# Patient Record
Sex: Male | Born: 1940
Health system: Southern US, Community
[De-identification: ages and names within clinical notes are randomized; demographics above are authoritative.]

## PROBLEM LIST (undated history)

## (undated) DIAGNOSIS — K648 Other hemorrhoids: Principal | ICD-10-CM

## (undated) DIAGNOSIS — Z86010 Personal history of colonic polyps: Secondary | ICD-10-CM

## (undated) DIAGNOSIS — E785 Hyperlipidemia, unspecified: Secondary | ICD-10-CM

## (undated) DIAGNOSIS — I10 Essential (primary) hypertension: Secondary | ICD-10-CM

## (undated) DIAGNOSIS — K5790 Diverticulosis of intestine, part unspecified, without perforation or abscess without bleeding: Secondary | ICD-10-CM

## (undated) DIAGNOSIS — E042 Nontoxic multinodular goiter: Secondary | ICD-10-CM

## (undated) DIAGNOSIS — D126 Benign neoplasm of colon, unspecified: Secondary | ICD-10-CM

## (undated) DIAGNOSIS — I2510 Atherosclerotic heart disease of native coronary artery without angina pectoris: Secondary | ICD-10-CM

## (undated) HISTORY — PX: WRIST SURGERY: SHX841

## (undated) HISTORY — DX: Other hemorrhoids: K64.8

## (undated) HISTORY — DX: Atherosclerotic heart disease of native coronary artery without angina pectoris: I25.10

## (undated) HISTORY — DX: Personal history of colonic polyps: Z86.010

## (undated) HISTORY — DX: Hyperlipidemia, unspecified: E78.5

## (undated) HISTORY — DX: Nontoxic multinodular goiter: E04.2

## (undated) HISTORY — DX: Essential (primary) hypertension: I10

## (undated) HISTORY — DX: Benign neoplasm of colon, unspecified: D12.6

## (undated) HISTORY — DX: Diverticulosis of intestine, part unspecified, without perforation or abscess without bleeding: K57.90

## (undated) HISTORY — PX: COLONOSCOPY: SHX174

---

## 1998-04-28 ENCOUNTER — Encounter: Admission: RE | Admit: 1998-04-28 | Discharge: 1998-04-28 | Payer: Self-pay | Admitting: Sports Medicine

## 1999-07-21 ENCOUNTER — Encounter: Admission: RE | Admit: 1999-07-21 | Discharge: 1999-07-21 | Payer: Self-pay | Admitting: Family Medicine

## 2000-09-16 ENCOUNTER — Emergency Department (HOSPITAL_COMMUNITY): Admission: EM | Admit: 2000-09-16 | Discharge: 2000-09-16 | Payer: Self-pay | Admitting: Emergency Medicine

## 2001-01-25 ENCOUNTER — Encounter: Admission: RE | Admit: 2001-01-25 | Discharge: 2001-01-25 | Payer: Self-pay | Admitting: Sports Medicine

## 2001-09-20 ENCOUNTER — Encounter: Admission: RE | Admit: 2001-09-20 | Discharge: 2001-09-20 | Payer: Self-pay | Admitting: Sports Medicine

## 2002-08-09 ENCOUNTER — Encounter (INDEPENDENT_AMBULATORY_CARE_PROVIDER_SITE_OTHER): Payer: Self-pay | Admitting: Specialist

## 2002-08-09 ENCOUNTER — Ambulatory Visit (HOSPITAL_COMMUNITY): Admission: RE | Admit: 2002-08-09 | Discharge: 2002-08-09 | Payer: Self-pay | Admitting: Gastroenterology

## 2002-08-09 DIAGNOSIS — Z860100 Personal history of colon polyps, unspecified: Secondary | ICD-10-CM

## 2002-08-09 HISTORY — DX: Personal history of colon polyps, unspecified: Z86.0100

## 2002-08-09 HISTORY — DX: Personal history of colonic polyps: Z86.010

## 2003-06-17 ENCOUNTER — Encounter: Admission: RE | Admit: 2003-06-17 | Discharge: 2003-06-17 | Payer: Self-pay | Admitting: Sports Medicine

## 2003-12-31 ENCOUNTER — Ambulatory Visit (HOSPITAL_COMMUNITY): Admission: RE | Admit: 2003-12-31 | Discharge: 2003-12-31 | Payer: Self-pay | Admitting: Internal Medicine

## 2004-01-22 ENCOUNTER — Ambulatory Visit (HOSPITAL_COMMUNITY): Admission: RE | Admit: 2004-01-22 | Discharge: 2004-01-22 | Payer: Self-pay | Admitting: Internal Medicine

## 2004-02-02 ENCOUNTER — Ambulatory Visit (HOSPITAL_COMMUNITY): Admission: RE | Admit: 2004-02-02 | Discharge: 2004-02-02 | Payer: Self-pay | Admitting: Internal Medicine

## 2004-02-02 ENCOUNTER — Encounter (INDEPENDENT_AMBULATORY_CARE_PROVIDER_SITE_OTHER): Payer: Self-pay | Admitting: *Deleted

## 2005-05-03 ENCOUNTER — Ambulatory Visit: Payer: Self-pay | Admitting: Internal Medicine

## 2006-01-20 ENCOUNTER — Ambulatory Visit: Payer: Self-pay | Admitting: Family Medicine

## 2006-07-11 ENCOUNTER — Ambulatory Visit: Payer: Self-pay | Admitting: Internal Medicine

## 2006-09-26 ENCOUNTER — Encounter: Payer: Self-pay | Admitting: Infectious Diseases

## 2006-11-13 ENCOUNTER — Ambulatory Visit: Payer: Self-pay | Admitting: Infectious Diseases

## 2006-11-13 DIAGNOSIS — E041 Nontoxic single thyroid nodule: Secondary | ICD-10-CM | POA: Insufficient documentation

## 2006-11-13 DIAGNOSIS — K589 Irritable bowel syndrome without diarrhea: Secondary | ICD-10-CM | POA: Insufficient documentation

## 2006-11-13 DIAGNOSIS — I10 Essential (primary) hypertension: Secondary | ICD-10-CM | POA: Insufficient documentation

## 2007-08-08 ENCOUNTER — Ambulatory Visit: Payer: Self-pay | Admitting: Internal Medicine

## 2007-08-11 ENCOUNTER — Ambulatory Visit: Payer: Self-pay | Admitting: Family Medicine

## 2007-08-23 ENCOUNTER — Telehealth: Payer: Self-pay | Admitting: Internal Medicine

## 2007-09-11 ENCOUNTER — Ambulatory Visit: Payer: Self-pay | Admitting: Family Medicine

## 2007-09-18 ENCOUNTER — Encounter: Payer: Self-pay | Admitting: Family Medicine

## 2007-09-18 ENCOUNTER — Encounter: Payer: Self-pay | Admitting: Internal Medicine

## 2007-12-05 ENCOUNTER — Ambulatory Visit: Payer: Self-pay | Admitting: Sports Medicine

## 2007-12-05 DIAGNOSIS — M779 Enthesopathy, unspecified: Secondary | ICD-10-CM | POA: Insufficient documentation

## 2008-01-24 ENCOUNTER — Telehealth: Payer: Self-pay | Admitting: Internal Medicine

## 2008-02-19 ENCOUNTER — Telehealth: Payer: Self-pay | Admitting: Internal Medicine

## 2008-03-04 ENCOUNTER — Encounter: Payer: Self-pay | Admitting: Internal Medicine

## 2008-09-05 ENCOUNTER — Encounter: Payer: Self-pay | Admitting: Internal Medicine

## 2008-10-07 ENCOUNTER — Telehealth (INDEPENDENT_AMBULATORY_CARE_PROVIDER_SITE_OTHER): Payer: Self-pay | Admitting: *Deleted

## 2008-10-07 ENCOUNTER — Telehealth: Payer: Self-pay | Admitting: Internal Medicine

## 2008-12-02 ENCOUNTER — Encounter: Payer: Self-pay | Admitting: Cardiology

## 2008-12-17 ENCOUNTER — Emergency Department (HOSPITAL_COMMUNITY): Admission: EM | Admit: 2008-12-17 | Discharge: 2008-12-18 | Payer: Self-pay | Admitting: Emergency Medicine

## 2008-12-25 ENCOUNTER — Ambulatory Visit: Payer: Self-pay | Admitting: Internal Medicine

## 2008-12-25 LAB — CONVERTED CEMR LAB
ALT: 18 units/L (ref 0–53)
AST: 25 units/L (ref 0–37)
Albumin: 3.9 g/dL (ref 3.5–5.2)
Alkaline Phosphatase: 56 units/L (ref 39–117)
BUN: 18 mg/dL (ref 6–23)
Basophils Relative: 0.2 % (ref 0.0–3.0)
Bilirubin Urine: NEGATIVE
Cholesterol: 175 mg/dL (ref 0–200)
Eosinophils Relative: 2 % (ref 0.0–5.0)
Glucose, Bld: 138 mg/dL — ABNORMAL HIGH (ref 70–99)
Hemoglobin, Urine: NEGATIVE
LDL Cholesterol: 111 mg/dL — ABNORMAL HIGH (ref 0–99)
Leukocytes, UA: NEGATIVE
Lymphocytes Relative: 34.5 % (ref 12.0–46.0)
Lymphs Abs: 1.6 10*3/uL (ref 0.7–4.0)
MCHC: 35.9 g/dL (ref 30.0–36.0)
MCV: 85.6 fL (ref 78.0–100.0)
Monocytes Relative: 10 % (ref 3.0–12.0)
Nitrite: NEGATIVE
PSA: 1.53 ng/mL (ref 0.10–4.00)
Platelets: 179 10*3/uL (ref 150.0–400.0)
Potassium: 3.5 meq/L (ref 3.5–5.1)
RBC: 4.77 M/uL (ref 4.22–5.81)
Sodium: 142 meq/L (ref 135–145)
TSH: 2.4 microintl units/mL (ref 0.35–5.50)
Total Protein: 6.6 g/dL (ref 6.0–8.3)
Triglycerides: 119 mg/dL (ref 0.0–149.0)
Urine Glucose: NEGATIVE mg/dL
Urobilinogen, UA: 0.2 (ref 0.0–1.0)

## 2009-01-01 ENCOUNTER — Ambulatory Visit: Payer: Self-pay | Admitting: Internal Medicine

## 2009-01-01 DIAGNOSIS — R0789 Other chest pain: Secondary | ICD-10-CM | POA: Insufficient documentation

## 2009-01-19 ENCOUNTER — Telehealth: Payer: Self-pay | Admitting: Internal Medicine

## 2009-01-19 ENCOUNTER — Ambulatory Visit (HOSPITAL_COMMUNITY): Admission: RE | Admit: 2009-01-19 | Discharge: 2009-01-19 | Payer: Self-pay | Admitting: Internal Medicine

## 2009-01-20 ENCOUNTER — Telehealth (INDEPENDENT_AMBULATORY_CARE_PROVIDER_SITE_OTHER): Payer: Self-pay

## 2009-01-20 ENCOUNTER — Encounter: Payer: Self-pay | Admitting: Internal Medicine

## 2009-01-21 ENCOUNTER — Ambulatory Visit: Payer: Self-pay

## 2009-01-21 ENCOUNTER — Ambulatory Visit: Payer: Self-pay | Admitting: Cardiology

## 2009-01-21 ENCOUNTER — Encounter: Payer: Self-pay | Admitting: Cardiology

## 2009-01-21 ENCOUNTER — Encounter (INDEPENDENT_AMBULATORY_CARE_PROVIDER_SITE_OTHER): Payer: Self-pay | Admitting: *Deleted

## 2009-01-22 ENCOUNTER — Ambulatory Visit: Payer: Self-pay | Admitting: Cardiology

## 2009-01-22 ENCOUNTER — Ambulatory Visit (HOSPITAL_COMMUNITY): Admission: RE | Admit: 2009-01-22 | Discharge: 2009-01-23 | Payer: Self-pay | Admitting: Cardiology

## 2009-01-22 ENCOUNTER — Encounter: Payer: Self-pay | Admitting: Cardiology

## 2009-01-22 HISTORY — PX: CORONARY STENT PLACEMENT: SHX1402

## 2009-01-22 LAB — CONVERTED CEMR LAB
BUN: 14 mg/dL (ref 6–23)
Basophils Absolute: 0 10*3/uL (ref 0.0–0.1)
Basophils Relative: 0.3 % (ref 0.0–3.0)
Calcium: 9.2 mg/dL (ref 8.4–10.5)
Creatinine, Ser: 1 mg/dL (ref 0.4–1.5)
Glucose, Bld: 116 mg/dL — ABNORMAL HIGH (ref 70–99)
Hemoglobin: 14.7 g/dL (ref 13.0–17.0)
Lymphocytes Relative: 25.5 % (ref 12.0–46.0)
Lymphs Abs: 1.3 10*3/uL (ref 0.7–4.0)
Neutrophils Relative %: 59.2 % (ref 43.0–77.0)
Platelets: 154 10*3/uL (ref 150.0–400.0)
Potassium: 3.7 meq/L (ref 3.5–5.1)
RDW: 12.2 % (ref 11.5–14.6)
aPTT: 28.7 s (ref 21.7–28.8)

## 2009-01-29 ENCOUNTER — Encounter: Payer: Self-pay | Admitting: Cardiology

## 2009-01-29 ENCOUNTER — Encounter (HOSPITAL_COMMUNITY): Admission: RE | Admit: 2009-01-29 | Discharge: 2009-04-29 | Payer: Self-pay | Admitting: Cardiology

## 2009-01-31 DIAGNOSIS — E118 Type 2 diabetes mellitus with unspecified complications: Secondary | ICD-10-CM | POA: Insufficient documentation

## 2009-01-31 DIAGNOSIS — E785 Hyperlipidemia, unspecified: Secondary | ICD-10-CM | POA: Insufficient documentation

## 2009-02-01 DIAGNOSIS — I2510 Atherosclerotic heart disease of native coronary artery without angina pectoris: Secondary | ICD-10-CM | POA: Insufficient documentation

## 2009-02-02 ENCOUNTER — Ambulatory Visit: Payer: Self-pay | Admitting: Cardiology

## 2009-02-04 ENCOUNTER — Telehealth: Payer: Self-pay | Admitting: Internal Medicine

## 2009-02-06 ENCOUNTER — Ambulatory Visit: Payer: Self-pay | Admitting: Internal Medicine

## 2009-02-25 ENCOUNTER — Encounter: Payer: Self-pay | Admitting: Cardiology

## 2009-03-03 ENCOUNTER — Ambulatory Visit: Payer: Self-pay | Admitting: Cardiology

## 2009-03-03 LAB — CONVERTED CEMR LAB
Alkaline Phosphatase: 41 units/L (ref 39–117)
Bilirubin, Direct: 0 mg/dL (ref 0.0–0.3)
Cholesterol: 91 mg/dL (ref 0–200)
HDL: 39.5 mg/dL (ref >39.00)
Total Bilirubin: 1 mg/dL (ref 0.3–1.2)
Triglycerides: 66 mg/dL (ref 0.0–149.0)
VLDL: 13.2 mg/dL (ref 0.0–40.0)

## 2009-03-05 ENCOUNTER — Ambulatory Visit: Payer: Self-pay | Admitting: Cardiology

## 2009-03-26 ENCOUNTER — Telehealth: Payer: Self-pay | Admitting: Cardiology

## 2009-03-30 ENCOUNTER — Encounter: Payer: Self-pay | Admitting: Cardiology

## 2009-04-06 ENCOUNTER — Encounter: Payer: Self-pay | Admitting: Internal Medicine

## 2009-04-14 ENCOUNTER — Telehealth: Payer: Self-pay | Admitting: Internal Medicine

## 2009-04-15 ENCOUNTER — Telehealth: Payer: Self-pay | Admitting: Cardiology

## 2009-04-30 ENCOUNTER — Encounter (HOSPITAL_COMMUNITY): Admission: RE | Admit: 2009-04-30 | Discharge: 2009-07-01 | Payer: Self-pay | Admitting: Cardiology

## 2009-05-06 ENCOUNTER — Telehealth: Payer: Self-pay | Admitting: Internal Medicine

## 2009-05-21 ENCOUNTER — Encounter: Payer: Self-pay | Admitting: Cardiology

## 2009-05-26 ENCOUNTER — Telehealth: Payer: Self-pay | Admitting: Cardiology

## 2009-06-08 ENCOUNTER — Encounter: Payer: Self-pay | Admitting: Internal Medicine

## 2009-06-08 ENCOUNTER — Ambulatory Visit: Payer: Self-pay | Admitting: Cardiology

## 2009-06-09 ENCOUNTER — Telehealth: Payer: Self-pay | Admitting: Cardiology

## 2009-06-09 ENCOUNTER — Telehealth (INDEPENDENT_AMBULATORY_CARE_PROVIDER_SITE_OTHER): Payer: Self-pay | Admitting: *Deleted

## 2009-06-10 LAB — CONVERTED CEMR LAB
Bilirubin, Direct: 0 mg/dL (ref 0.0–0.3)
HDL: 44.7 mg/dL (ref >39.00)
Hgb A1c MFr Bld: 6.7 % — ABNORMAL HIGH (ref 4.6–6.5)
LDL Cholesterol: 48 mg/dL (ref 0–99)
Total Bilirubin: 0.9 mg/dL (ref 0.3–1.2)
Total Protein: 6.3 g/dL (ref 6.0–8.3)
Triglycerides: 84 mg/dL (ref 0.0–149.0)
VLDL: 16.8 mg/dL (ref 0.0–40.0)

## 2009-07-07 ENCOUNTER — Ambulatory Visit: Payer: Self-pay | Admitting: Internal Medicine

## 2009-07-08 ENCOUNTER — Telehealth: Payer: Self-pay | Admitting: Internal Medicine

## 2009-08-14 ENCOUNTER — Encounter: Payer: Self-pay | Admitting: Cardiology

## 2009-09-01 ENCOUNTER — Encounter: Payer: Self-pay | Admitting: Internal Medicine

## 2009-09-07 ENCOUNTER — Telehealth: Payer: Self-pay | Admitting: Internal Medicine

## 2009-09-13 ENCOUNTER — Emergency Department (HOSPITAL_COMMUNITY): Admission: EM | Admit: 2009-09-13 | Discharge: 2009-09-13 | Payer: Self-pay | Admitting: Emergency Medicine

## 2009-10-12 ENCOUNTER — Telehealth: Payer: Self-pay | Admitting: Internal Medicine

## 2009-11-26 ENCOUNTER — Ambulatory Visit: Payer: Self-pay | Admitting: Internal Medicine

## 2009-11-26 LAB — CONVERTED CEMR LAB
ALT: 25 units/L (ref 0–53)
AST: 23 units/L (ref 0–37)
Basophils Absolute: 0 10*3/uL (ref 0.0–0.1)
Bilirubin, Direct: 0.1 mg/dL (ref 0.0–0.3)
Chloride: 109 meq/L (ref 96–112)
Eosinophils Absolute: 0.1 10*3/uL (ref 0.0–0.7)
GFR calc non Af Amer: 110.44 mL/min (ref >60)
Glucose, Bld: 125 mg/dL — ABNORMAL HIGH (ref 70–99)
HCT: 40 % (ref 39.0–52.0)
HDL: 46.9 mg/dL (ref >39.00)
Hemoglobin, Urine: NEGATIVE
Hgb A1c MFr Bld: 6.7 % — ABNORMAL HIGH (ref 4.6–6.5)
Ketones, ur: NEGATIVE mg/dL
LDL Cholesterol: 49 mg/dL (ref 0–99)
Lymphocytes Relative: 30.1 % (ref 12.0–46.0)
MCV: 86.3 fL (ref 78.0–100.0)
Platelets: 149 10*3/uL — ABNORMAL LOW (ref 150.0–400.0)
RDW: 13.1 % (ref 11.5–14.6)
Testosterone: 357.82 ng/dL (ref 350.00–890.00)
Total Bilirubin: 0.7 mg/dL (ref 0.3–1.2)
Total CHOL/HDL Ratio: 2
Triglycerides: 78 mg/dL (ref 0.0–149.0)
Urine Glucose: NEGATIVE mg/dL
Urobilinogen, UA: 0.2 (ref 0.0–1.0)
pH: 5.5 (ref 5.0–8.0)

## 2009-11-27 ENCOUNTER — Ambulatory Visit: Payer: Self-pay | Admitting: Internal Medicine

## 2009-11-27 DIAGNOSIS — M159 Polyosteoarthritis, unspecified: Secondary | ICD-10-CM | POA: Insufficient documentation

## 2009-11-30 DIAGNOSIS — N529 Male erectile dysfunction, unspecified: Secondary | ICD-10-CM | POA: Insufficient documentation

## 2009-12-10 ENCOUNTER — Ambulatory Visit: Payer: Self-pay | Admitting: Cardiology

## 2009-12-23 ENCOUNTER — Encounter: Payer: Self-pay | Admitting: Internal Medicine

## 2010-01-28 ENCOUNTER — Encounter: Payer: Self-pay | Admitting: Internal Medicine

## 2010-04-01 ENCOUNTER — Telehealth: Payer: Self-pay | Admitting: Internal Medicine

## 2010-06-01 ENCOUNTER — Encounter: Payer: Self-pay | Admitting: Internal Medicine

## 2010-06-16 ENCOUNTER — Telehealth (INDEPENDENT_AMBULATORY_CARE_PROVIDER_SITE_OTHER): Payer: Self-pay | Admitting: *Deleted

## 2010-06-17 ENCOUNTER — Ambulatory Visit: Payer: Self-pay | Admitting: Internal Medicine

## 2010-06-17 LAB — CONVERTED CEMR LAB: Hgb A1c MFr Bld: 7 % — ABNORMAL HIGH (ref 4.6–6.5)

## 2010-06-18 ENCOUNTER — Ambulatory Visit: Payer: Self-pay | Admitting: Internal Medicine

## 2010-06-22 ENCOUNTER — Encounter: Payer: Self-pay | Admitting: Cardiology

## 2010-06-22 ENCOUNTER — Telehealth: Payer: Self-pay | Admitting: Internal Medicine

## 2010-06-22 ENCOUNTER — Ambulatory Visit: Payer: Self-pay | Admitting: Cardiology

## 2010-08-01 LAB — CONVERTED CEMR LAB: HDL goal, serum: 40 mg/dL

## 2010-08-03 NOTE — Progress Notes (Signed)
Summary: authorization for release of med info to [facility]   Phone Note Call from Patient Call back at Work Phone [phone]   Caller: Patient Summary of Call: Patient has been advised that a representative of [facility] will call office for further information regarding his care.  Patient is giving verbal authorization for information to be released.  Patient does not need a call back. Initial call taken by: Burnard Leigh,  April 15, 2009 2:12 PM  Follow-up for Phone Call        I have not received a call. I will file the message in the pt's chart. Sherri Rad, RN, BSN  April 21, 2009 10:52 AM

## 2010-08-03 NOTE — Assessment & Plan Note (Signed)
Summary: PER PT FU B4 6/9--STC   Vital Signs:  Patient profile:   70 year old male Height:      72 inches Weight:      177 pounds BMI:     24.09 O2 Sat:      97 % on Room air Temp:     97.0 degrees F oral Pulse rate:   63 / minute BP sitting:   120 / 80  (left arm) Cuff size:   regular  Vitals Entered By: Bill Salinas CMA (Nov 27, 2009 11:03 AM)  O2 Flow:  Room air  Primary Care Provider:  Norins   History of Present Illness: Reviewed labs: focused on lipid panel and A1C.  Has AM stiffness and some back pain. Worse in the AM. No limitations in activity and he is not taking any analgesics or anti-inflammatory medications.  He has concerns about sexual performance. He has no diffiuclty attaining an erection or completing intercourse. He does [place] Viagra which has a positive effect. He reports that his libido is intact.   He has not had any cardiac symptoms: he continues to run and maintain a full schedule of work and physical activity.   Current Medications (verified): 1)  Viagra 100 Mg Tabs (Sildenafil Citrate) .... Take 1 Tablet By Mouth Once A Day As Needed 2)  Aspirin Ec 325 Mg Tbec (Aspirin) .... Take One Tablet By Mouth Daily 3)  Plavix 75 Mg Tabs (Clopidogrel Bisulfate) .Marland Kitchen.. 1 Tab Once Daily 4)  Crestor 20 Mg Tabs (Rosuvastatin Calcium) .... Take One Tablet By Mouth Daily. 5)  Zolpidem Tartrate 10 Mg Tabs (Zolpidem Tartrate) .Marland Kitchen.. 1 By Mouth At Bedtime As Needed 6)  Nitroglycerin 0.4 Mg Subl (Nitroglycerin) .... One Tablet Under Tongue Every 5 Minutes As Needed For Chest Pain---May Repeat Times Three As Needed 7)  Metmucil .... As Needed Maybe Once Every 2 Weeks 8)  Tylenol 325 Mg Tabs (Acetaminophen) .... As Needed For Lower Back Pain  Allergies (verified): No Known Drug Allergies  Past History:  Past Medical History: Last updated: 01/31/2009 Hypertension thyroid nodules, sees Dr. Hillary Bow at [facility] Hyperlipidemia CAD S/P DES LAD and Dx1  01/2009  Past Surgical History: Last updated: 09/11/2007 colonoscopy 2008 per Dr. Melchor Amour, several polyps  Family History: Last updated: 28-Jan-2009 father - deceased @89 :Positive for CVA @ 74, bladder cancer mother - deceased @ 45: CHF, CAD negative for colon cancer, CAD.   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  Never Smoked  Risk Factors: Smoking Status: never (09/11/2007)  Review of Systems  The patient denies anorexia, fever, weight loss, weight gain, hoarseness, chest pain, syncope, dyspnea on exertion, peripheral edema, headaches, abdominal pain, severe indigestion/heartburn, muscle weakness, depression, abnormal bleeding, and enlarged lymph nodes.    Physical Exam  General:  alert, well-developed, well-nourished, well-hydrated, and healthy-appearing.   Head:  normocephalic and atraumatic.   Eyes:  corneas and lenses clear and no injection.   Neck:  supple and full ROM.   Lungs:  normal respiratory effort and normal breath sounds.   Heart:  normal rate, regular rhythm, and no murmur.   Msk:  normal ROM, no joint tenderness, no joint swelling, no joint warmth, and no joint deformities.   Pulses:  2+ radial Neurologic:  alert & oriented X3,  cranial nerves II-XII intact, and gait normal.   Skin:  turgor normal and color normal.   Psych:  Oriented X3, memory intact for recent and remote, normally interactive, and good eye contact.     Impression & Recommendations:  Problem # 1:  DIABETES-TYPE 2 (ICD-250.00) Most recent A1C remains @ 6.7%. He is below goal but still in the abnormal range. We discussed comfort with risk and the goals of diabetic therapy.  Plan - minimize risk by initiating medicl treatment: metformin 500mg  two times a day.            repeat A1C in  3 months.   His updated medication list for this problem includes:    Aspirin Ec 325 Mg Tbec (Aspirin) .Marland Kitchen... Take one tablet by mouth daily    Metformin Hcl 500 Mg Tabs (Metformin hcl) .Marland Kitchen... 1 by mouth two times a day  Labs Reviewed: Creat: 1.0 (01/21/2009)    Reviewed HgBA1c results: 6.7 (06/08/2009)  6.9 (01/01/2009)  Problem # 2:  HYPERLIPIDEMIA-MIXED (ICD-272.4) Recent lab reveals excellent control with LDL 49, HDL 46.9. LFTs normal  Plan - continue crestor 2mg  once daily   His updated medication list for this problem includes:    Crestor 20 Mg Tabs (Rosuvastatin calcium) .Marland Kitchen... Take one tablet by mouth daily.  Problem # 3:  CAD, NATIVE VESSEL (ICD-414.01) Very stable with no symptoms and no limitation in activity. He is current with his cardiologist.   His updated medication list for this problem includes:    Aspirin Ec 325 Mg Tbec (Aspirin) .Marland Kitchen... Take one tablet by mouth daily    Plavix 75 Mg Tabs (Clopidogrel bisulfate) .Marland Kitchen... 1 tab once daily    Nitroglycerin 0.4 Mg Subl (Nitroglycerin) ..... One tablet under tongue every 5 minutes as needed for chest pain---may repeat times three as needed  Problem # 4:  HYPERTENSION (ICD-401.9)  BP today: 120/80 Prior BP: 138/82 (07/07/2009)  Prior 10 Yr Risk Heart Disease: N/A (03/05/2009)  Good control - at goal.  Plan - continue present regimen  Problem # 5:  THYROID NODULE (ICD-241.0) Relatively recent follow-up at [facility]. He had a repeat needle biopsy. Everythng is apparently stable.   Problem # 6:  ERECTILE DYSFUNCTION, ORGANIC (ICD-607.84) Mild ED. Testosterone level is normal range. He is advised that there is no indication for testosterone replacement.   His updated medication list for this problem includes:    Viagra 100 Mg Tabs (Sildenafil citrate) .Marland Kitchen... Take 1 tablet by mouth once a day as needed  Problem # 7:  OSTEOARTHRITIS, GENERALIZED (ICD-715.00) New c/o mild AM stiffness. No report of inflammed  joints or persistent chronic pain. Reviewed his symptoms.   Plan - heat           flex-stretch excercise           otc NSAIDs as needed.  His updated medication list for this problem includes:    Aspirin Ec 325 Mg Tbec (Aspirin) .Marland Kitchen... Take one tablet by mouth daily    Tylenol 325 Mg Tabs (Acetaminophen) .Marland Kitchen... As needed for lower back pain  Complete Medication List: 1)  Viagra 100 Mg Tabs (Sildenafil citrate) .... Take 1 tablet by mouth once a day as needed 2)  Aspirin Ec 325 Mg Tbec (Aspirin) .... Take one tablet by mouth daily 3)  Plavix 75 Mg Tabs (Clopidogrel bisulfate) .Marland Kitchen.. 1 tab once daily 4)  Crestor 20 Mg Tabs (Rosuvastatin calcium) .... Take one tablet by mouth daily. 5)  Zolpidem Tartrate 10 Mg  Tabs (Zolpidem tartrate) .Marland Kitchen.. 1 by mouth at bedtime as needed 6)  Nitroglycerin 0.4 Mg Subl (Nitroglycerin) .... One tablet under tongue every 5 minutes as needed for chest pain---may repeat times three as needed 7)  Metmucil  .... As needed maybe once every 2 weeks 8)  Tylenol 325 Mg Tabs (Acetaminophen) .... As needed for lower back pain 9)  Metformin Hcl 500 Mg Tabs (Metformin hcl) .Marland Kitchen.. 1 by mouth two times a day  Patient: Alex Meyers Note: All result statuses are Final unless otherwise noted.  Tests: (1) Lipid Panel (LIPID)   Cholesterol               111 mg/dL                   0-454     ATP III Classification            Desirable:  < 200 mg/dL                    Borderline High:  200 - 239 mg/dL               High:  > = 240 mg/dL   Triglycerides             78.0 mg/dL                  0.9-811.9     Normal:  <150 mg/dL     Borderline High:  147 - 199 mg/dL   HDL                       82.95 mg/dL                 >62.13   VLDL Cholesterol          15.6 mg/dL                  0.8-65.7   LDL Cholesterol           49 mg/dL                    8-46  CHO/HDL Ratio:  CHD Risk                             2                    Men          Women     1/2 Average Risk     3.4           3.3     Average Risk          5.0          4.4     2X Average Risk          9.6          7.1     3X Average Risk          15.0          11.0                           Tests: (2) BMP (METABOL)   Sodium                    141 mEq/L  135-145   Potassium                 3.9 mEq/L                   3.5-5.1   Chloride                  109 mEq/L                   96-112   Carbon Dioxide            25 mEq/L                    19-32   Glucose              [H]  125 mg/dL                   16-10   BUN                       23 mg/dL                    9-60   Creatinine                0.8 mg/dL                   4.5-4.0   Calcium                   9.0 mg/dL                   9.8-11.9   GFR                       110.44 mL/min               >60  Tests: (3) CBC Platelet w/Diff (CBCD)   White Cell Count          4.8 K/uL                    4.5-10.5   Red Cell Count            4.64 Mil/uL                 4.22-5.81   Hemoglobin                14.0 g/dL                   14.7-82.9   Hematocrit                40.0 %                      39.0-52.0   MCV                       86.3 fl                     78.0-100.0   MCHC                      34.9 g/dL                   56.2-13.0   RDW  13.1 %                      11.5-14.6   Platelet Count       [L]  149.0 K/uL                  150.0-400.0   Neutrophil %              58.1 %                      43.0-77.0   Lymphocyte %              30.1 %                      12.0-46.0   Monocyte %                8.7 %                       3.0-12.0   Eosinophils%              2.5 %                       0.0-5.0   Basophils %               0.6 %                       0.0-3.0   Neutrophill Absolute      2.8 K/uL                    1.4-7.7   Lymphocyte Absolute       1.4 K/uL                    0.7-4.0   Monocyte Absolute         0.4 K/uL                    0.1-1.0  Eosinophils, Absolute                             0.1 K/uL                     0.0-0.7   Basophils Absolute        0.0 K/uL                    0.0-0.1  Tests: (4) Hepatic/Liver Function Panel (HEPATIC)   Total Bilirubin           0.7 mg/dL                   0.2-7.2   Direct Bilirubin          0.1 mg/dL                   5.3-6.6   Alkaline Phosphatase      45 U/L                      39-117   AST                       23 U/L  0-37   ALT                       25 U/L                      0-53   Total Protein             6.1 g/dL                    1.6-1.0   Albumin                   3.9 g/dL                    9.6-0.4  Tests: (5) TSH (TSH)   FastTSH                   1.66 uIU/mL                 0.35-5.50  Tests: (6) UDip Only (UDIP)   Color                     YELLOW       RANGE:  Yellow;Lt. Yellow   Clarity                   CLEAR                       Clear   Specific Gravity          >=1.030                     1.000 - 1.030   Urine Ph                  5.5                         5.0-8.0   Protein                   NEGATIVE                    Negative   Urine Glucose             NEGATIVE                    Negative   Ketones                   NEGATIVE                    Negative   Urine Bilirubin           NEGATIVE                    Negative   Blood                     NEGATIVE                    Negative   Urobilinogen              0.2                         0.0 - 1.0   Leukocyte Esterace  NEGATIVE                    Negative   Nitrite                   NEGATIVE                    Negative  Tests: (7) Prostate Specific Antigen (PSA)   PSA-Hyb                   0.94 ng/mL                  0.10-4.00  Tests: (8) Hemoglobin A1C (A1C)   Hemoglobin A1C       [H]  6.7 %                       4.6-6.5     Glycemic Control Guidelines for People with Diabetes:     Non Diabetic:  <6%     Goal of Therapy: <7%     Additional Action Suggested:  >8%   Tests: (9) Testosterone, Total (TESTO)   Testosterone              357.82 ng/dL                 [id number]: METFORMIN HCL 500 MG TABS (METFORMIN HCL) 1 by mouth two times a day  #60 x 12   Entered and Authorized by:   Jacques Navy MD   Signed by:   Jacques Navy MD on 11/27/2009   Method used:   Electronically to        [facility]. [phone]* (retail)       [phone]       [place], [place]  [address]       Ph: [phone]       Fax: [phone]   RxID:   [id number]

## 2010-08-03 NOTE — Letter (Signed)
Summary: [facility]   Imported By: Sherian Rein 12/30/2009 11:53:21  _____________________________________________________________________  External Attachment:    Type:   Image     Comment:   External Document

## 2010-08-03 NOTE — Progress Notes (Signed)
  Phone Note Refill Request Message from:  Fax from Pharmacy on April 01, 2010 11:25 AM  Refills Requested: Medication #1:  ZOLPIDEM TARTRATE 10 MG TABS 1 by mouth at bedtime as needed   Last Refilled: 03/07/2010 Please Advise refill  Initial call taken by: Ami Bullins CMA,  April 01, 2010 11:25 AM  Follow-up for Phone Call        ok to refill x 5 Follow-up by: Jacques Navy MD,  April 01, 2010 12:53 PM    Prescriptions: ZOLPIDEM TARTRATE 10 MG TABS (ZOLPIDEM TARTRATE) 1 by mouth at bedtime as needed  #30 x 5   Entered by:   Ami Bullins CMA   Authorized by:   Jacques Navy MD   Signed by:   Bill Salinas CMA on 04/01/2010   Method used:   Telephoned to ...       [facility]. [phone]* (retail)       [phone]       [place], [place]  [address]       Ph: [phone]       Fax: [phone]   RxID:   [id number]

## 2010-08-03 NOTE — Progress Notes (Signed)
  Phone Note Refill Request Message from:  Fax from Pharmacy on July 08, 2009 8:20 AM  Refills Requested: Medication #1:  ZOLPIDEM TARTRATE 10 MG TABS 1 by mouth at bedtime as needed fax from [facility] [address]. Please Advise refill  Initial call taken by: Ami Bullins CMA,  July 08, 2009 8:20 AM  Follow-up for Phone Call        refill as needed  Follow-up by: Jacques Navy MD,  July 08, 2009 10:08 AM    Prescriptions: ZOLPIDEM TARTRATE 10 MG TABS (ZOLPIDEM TARTRATE) 1 by mouth at bedtime as needed  #30 x 1   Entered by:   Ami Bullins CMA   Authorized by:   Jacques Navy MD   Signed by:   Bill Salinas CMA on 07/08/2009   Method used:   Telephoned to ...       [facility]. [phone]* (retail)       [phone]       [place], [place]  [address]       Ph: [phone]       Fax: [phone]   RxID:   [id number]

## 2010-08-03 NOTE — Assessment & Plan Note (Signed)
Summary: PT NEED DIABETES CHECKED-PAIN IN LEFT THIGH INTERIOR-LB   Vital Signs:  Patient profile:   70 year old male Height:      72 inches Weight:      178 pounds BMI:     24.23 O2 Sat:      97 % on Room air Temp:     98.0 degrees F oral Pulse rate:   58 / minute BP sitting:   138 / 82  (left arm) Cuff size:   regular  Vitals Entered By: Bill Salinas CMA (July 07, 2009 4:49 PM)  O2 Flow:  Room air CC: pt c/o left ear being stopped up. he also complains of pain in the crease of his left leg near his testicle/ ab   Primary Care Provider:  Delonta Yohannes  CC:  pt c/o left ear being stopped up. he also complains of pain in the crease of his left leg near his testicle/ ab.  History of Present Illness: [name] presents with a history of pain in the left groin for several weeks. He is able to run and do all his normal activities. He has had no injury. He has felt no mass/bulge or other abnormality. He recalls no injury or precipitating event. He evidently had a course of doxycycline which did not help  Diabetes has been well controlled with last A1C 6.7%. Discussed goals of therapy and that he is doing a great job.   CAD- he has been pain free and continues on his medical regimen.   Current Medications (verified): 1)  Viagra 100 Mg Tabs (Sildenafil Citrate) .... Take 1 Tablet By Mouth Once A Day As Needed 2)  Aspirin Ec 325 Mg Tbec (Aspirin) .... Take One Tablet By Mouth Daily 3)  Plavix 75 Mg Tabs (Clopidogrel Bisulfate) .Marland Kitchen.. 1 Tab Once Daily 4)  Crestor 20 Mg Tabs (Rosuvastatin Calcium) .... Take One Tablet By Mouth Daily. 5)  Zolpidem Tartrate 10 Mg Tabs (Zolpidem Tartrate) .Marland Kitchen.. 1 By Mouth At Bedtime As Needed 6)  Nitroglycerin 0.4 Mg Subl (Nitroglycerin) .... One Tablet Under Tongue Every 5 Minutes As Needed For Chest Pain---May Repeat Times Three As Needed 7)  Metmucil .... As Needed Maybe Once Every 2 Weeks  Allergies (verified): No Known Drug Allergies  Past  History:  Past Medical History: Last updated: 01/31/2009 Hypertension thyroid nodules, sees Dr. Hillary Bow at [facility] Diabetes Hyperlipidemia CAD S/P DES LAD and Dx1 01/2009  Past Surgical History: Last updated: 09/11/2007 colonoscopy 2008 per Dr. Melchor Amour, several polyps  Family History: Last updated: 01/19/09 father - deceased @89 :Positive for CVA @ 38, bladder cancer mother - deceased @ 52: CHF, CAD negative for colon cancer, CAD.   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  Never Smoked  Risk Factors: Smoking Status: never (09/11/2007)  Review of Systems  The patient denies anorexia, fever, weight loss, decreased hearing, chest pain, dyspnea on exertion, abdominal pain, hematuria, muscle weakness, difficulty walking, and enlarged lymph nodes.    Physical Exam  General:  Slender and fit appearing white male looking younger than his stated age.  Eyes:  corneas and lenses clear and no injection.   Lungs:  normal respiratory effort.   Heart:  normal rate and regular rhythm.   Abdomen:  soft, non-tender, no masses, no guarding, and  no inguinal hernia left.   Msk:  Hips with normal range of motion, no tenderness with internal/external rotation or pressure against the greater trochanter. Pulses:  2+ radial   Impression & Recommendations:  Problem # 1:  INGUINAL PAIN, LEFT (ICD-789.09) No evidence of hip pathology. No evidence of hernia. Suspect groin pull/strain.  Plan - supportive care with stretches, use of NSAIDs. His updated medication list for this problem includes:     Aspirin Ec 325 Mg Tbec (Aspirin) .Marland Kitchen... Take one tablet by mouth daily  Problem # 2:  DIABETES-TYPE 2 (ICD-250.00)  His updated medication list for this problem includes:    Aspirin  Ec 325 Mg Tbec (Aspirin) .Marland Kitchen... Take one tablet by mouth daily  Labs Reviewed: Creat: 1.0 (01/21/2009)    Reviewed HgBA1c results: 6.7 (06/08/2009)  6.9 (01/01/2009)  Good control with diet therapy  Problem # 3:  HYPERLIPIDEMIA-MIXED (ICD-272.4)  His updated medication list for this problem includes:    Crestor 20 Mg Tabs (Rosuvastatin calcium) .Marland Kitchen... Take one tablet by mouth daily.  Labs Reviewed: SGOT: 24 (06/08/2009)   SGPT: 27 (06/08/2009)  Lipid Goals: Chol Goal: 200 (01/01/2009)   HDL Goal: 40 (01/01/2009)   LDL Goal: 100 (01/01/2009)   TG Goal: 150 (01/01/2009)  Prior 10 Yr Risk Heart Disease: N/A (03/05/2009)   HDL:44.70 (06/08/2009), 39.50 (03/03/2009)  LDL:48 (06/08/2009), 38 (03/03/2009)  Chol:109 (06/08/2009), 91 (03/03/2009)  Trig:84.0 (06/08/2009), 66.0 (03/03/2009)  Great control!!  Complete Medication List: 1)  Viagra 100 Mg Tabs (Sildenafil citrate) .... Take 1 tablet by mouth once a day as needed 2)  Aspirin Ec 325 Mg Tbec (Aspirin) .... Take one tablet by mouth daily 3)  Plavix 75 Mg Tabs (Clopidogrel bisulfate) .Marland Kitchen.. 1 tab once daily 4)  Crestor 20 Mg Tabs (Rosuvastatin calcium) .... Take one tablet by mouth daily. 5)  Zolpidem Tartrate 10 Mg Tabs (Zolpidem tartrate) .Marland Kitchen.. 1 by mouth at bedtime as needed 6)  Nitroglycerin 0.4 Mg Subl (Nitroglycerin) .... One tablet under tongue every 5 minutes as needed for chest pain---may repeat times three as needed 7)  Metmucil  .... As needed maybe once every 2 weeks

## 2010-08-03 NOTE — Assessment & Plan Note (Signed)
Summary: f71m   Primary Provider:  Norins   History of Present Illness: Alex Meyers is 70 years old and return for followup management of CAD. On January 23, 1999 and he was admitted with increasing angina and underwent stenting of the LAD and the diagonal branch with drug-eluting stents. He's done quite well since that time and is back to jogging a regular basis without chest pain or other cardiac symptoms.  His other problems include hyperlipidemia and diabetes. And lipid profile just recently which was excellent which showed a total cholesterol of 111 and HDL of 47 and LDL of 49. His hemoglobin A1c was 6.7 and he was started on metformin.  He has a a scrotal infection and is on antibiotics and possibly might need surgery. We talked about not coming off the Plavix before July 22 and also the possibility of staying on Plavix to surgery even after that time.  Current Medications (verified): 1)  Viagra 100 Mg Tabs (Sildenafil Citrate) .... Take 1 Tablet By Mouth Once A Day As Needed 2)  Aspirin Ec 325 Mg Tbec (Aspirin) .... Take One Tablet By Mouth Daily 3)  Plavix 75 Mg Tabs (Clopidogrel Bisulfate) .Marland Kitchen.. 1 Tab Once Daily 4)  Crestor 20 Mg Tabs (Rosuvastatin Calcium) .... Take One Tablet By Mouth Daily. 5)  Zolpidem Tartrate 10 Mg Tabs (Zolpidem Tartrate) .Marland Kitchen.. 1 By Mouth At Bedtime As Needed 6)  Nitroglycerin 0.4 Mg Subl (Nitroglycerin) .... One Tablet Under Tongue Every 5 Minutes As Needed For Chest Pain---May Repeat Times Three As Needed 7)  Metmucil .... As Needed Maybe Once Every 2 Weeks 8)  Tylenol 325 Mg Tabs (Acetaminophen) .... As Needed For Lower Back Pain 9)  Metformin Hcl 500 Mg Tabs (Metformin Hcl) .Marland Kitchen.. 1 By Mouth Two Times A Day  Allergies (verified): No Known Drug Allergies  Past History:  Past Medical History: Reviewed history from 01/31/2009 and no changes required. Hypertension thyroid nodules, sees Dr. Hillary [facility] at [facility] Diabetes Hyperlipidemia CAD S/P DES LAD  and Dx1 01/2009  Review of Systems       ROS is negative except as outlined in HPI.   Vital Signs:  Patient profile:   70 year old male Height:      72 inches Weight:      175 pounds BMI:     23.82 Pulse rate:   66 / minute Pulse rhythm:   regular BP sitting:   126 / 84  (right arm) Cuff size:   large  Vitals Entered By: Danielle Rankin, CMA (December 10, 2009 5:04 PM)  Physical Exam  Additional Exam:  Gen. Well-nourished, in no distress   Neck: No JVD, thyroid not enlarged, no carotid bruits Lungs: No tachypnea, clear without rales, rhonchi or wheezes Cardiovascular: Rhythm regular, PMI not displaced,  heart sounds  normal, no murmurs or gallops, no peripheral edema, pulses normal in all 4 extremities. Abdomen: BS normal, abdomen soft and non-tender without masses or organomegaly, no hepatosplenomegaly. MS: No deformities, no cyanosis or clubbing   Neuro:  No focal sns   Skin:  no lesions    Impression & Recommendations:  Problem # 1:  CAD, NATIVE VESSEL (ICD-414.01) He had stenting of the LAD and diagonal branch with drug-eluting stents in July of 2010. He said no chest pain his palm appears stable. His updated medication list for this problem includes:    Aspirin Ec 325 Mg Tbec (Aspirin) .Marland Kitchen... Take one tablet by mouth daily    Plavix 75 Mg Tabs (  Clopidogrel bisulfate) .Marland Kitchen... 1 tab once daily    Nitroglycerin 0.4 Mg Subl (Nitroglycerin) ..... One tablet under tongue every 5 minutes as needed for chest pain---may repeat times three as needed  Orders: EKG w/ Interpretation (93000)  Problem # 2:  HYPERLIPIDEMIA-MIXED (ICD-272.4) He has hyperlipidemia but has an excellent lipid profile on Crestor. We will continue current therapy. His updated medication list for this problem includes:    Crestor 20 Mg Tabs (Rosuvastatin calcium) .Marland Kitchen... Take one tablet by mouth daily.  Problem # 3:  DIABETES-TYPE 2 (ICD-250.00) This problem is managed by Dr. Rosanne Gutting. His hemoglobin A1c was 6.7 and he  was recently started on metformin. His updated medication list for this problem includes:    Aspirin Ec 325 Mg Tbec (Aspirin) .Marland Kitchen... Take one tablet by mouth daily    Metformin Hcl 500 Mg Tabs (Metformin hcl) .Marland Kitchen... 1 by mouth two times a day  Patient Instructions: 1)  Your physician recommends that you continue on your current medications as directed. Please refer to the Current Medication list given to you today. 2)  Your physician wants you to follow-up in: 6 months.  You will receive a reminder letter in the mail two months in advance. If you don't receive a letter, please call our office to schedule the follow-up appointment.

## 2010-08-03 NOTE — Letter (Signed)
Summary: [facility]  [facility]   Imported By: Marylou Mccoy 07/22/2009 13:09:51  _____________________________________________________________________  External Attachment:    Type:   Image     Comment:   External Document

## 2010-08-03 NOTE — Progress Notes (Signed)
  Phone Note Refill Request Message from:  Fax from Pharmacy on October 12, 2009 8:52 AM  Refills Requested: Medication #1:  ZOLPIDEM TARTRATE 10 MG TABS 1 by mouth at bedtime as needed   Last Refilled: 09/07/2009 Please Advise pt was only given enough for one month in march.   Initial call taken by: Ami Bullins CMA,  October 12, 2009 8:52 AM  Follow-up for Phone Call        ok for #30 with 5 refills Follow-up by: Jacques Navy MD,  October 13, 2009 5:24 AM    Prescriptions: ZOLPIDEM TARTRATE 10 MG TABS (ZOLPIDEM TARTRATE) 1 by mouth at bedtime as needed  #30 x 5   Entered by:   Ami Bullins CMA   Authorized by:   Jacques Navy MD   Signed by:   Bill Salinas CMA on 10/13/2009   Method used:   Telephoned to ...       [facility]. [phone]* (retail)       [phone] [address].       [place], [place]  [address]       Ph: [phone]       Fax: [phone]   RxID:   [id number]

## 2010-08-03 NOTE — Letter (Signed)
Summary: [facility]  [facility]   Imported By: Sherian Rein 09/21/2009 08:21:44  _____________________________________________________________________  External Attachment:    Type:   Image     Comment:   External Document

## 2010-08-03 NOTE — Progress Notes (Signed)
  Phone Note Refill Request Message from:  Fax from Pharmacy on September 07, 2009 10:01 AM  Refills Requested: Medication #1:  ZOLPIDEM TARTRATE 10 MG TABS 1 by mouth at bedtime as needed   Dosage confirmed as above?Dosage Confirmed   Brand Name Necessary? No   Supply Requested: 3 months can pt have refill on medication? Please Advise  Initial call taken by: Ami Bullins CMA,  September 07, 2009 10:02 AM  Follow-up for Phone Call        yes Follow-up by: Etta Grandchild MD,  September 07, 2009 10:39 AM    Prescriptions: ZOLPIDEM TARTRATE 10 MG TABS (ZOLPIDEM TARTRATE) 1 by mouth at bedtime as needed  #30 x 0   Entered and Authorized by:   Ami Bullins CMA   Signed by:   Ami Bullins CMA on 09/07/2009   Method used:   Telephoned to ...       [facility]. [phone]* (retail)       [phone] [address]       [place], [place]  [address]       Ph: [phone]       Fax: [phone]   RxID:   [id number]

## 2010-08-03 NOTE — Letter (Signed)
Summary: Return Visit / [facility]  Return Visit / [facility]   Imported By: Lennie Odor 01/05/2010 11:30:39  _____________________________________________________________________  External Attachment:    Type:   Image     Comment:   External Document

## 2010-08-03 NOTE — Letter (Signed)
Summary: [facility]  [facility]   Imported By: Marylou Mccoy 09/18/2009 17:00:54  _____________________________________________________________________  External Attachment:    Type:   Image     Comment:   External Document

## 2010-08-03 NOTE — Letter (Signed)
Summary: [facility]   Imported By: Lester [place] 06/04/2010 09:42:30  _____________________________________________________________________  External Attachment:    Type:   Image     Comment:   External Document

## 2010-08-04 DIAGNOSIS — Z860100 Personal history of colon polyps, unspecified: Secondary | ICD-10-CM

## 2010-08-04 HISTORY — DX: Personal history of colonic polyps: Z86.010

## 2010-08-04 HISTORY — DX: Personal history of colon polyps, unspecified: Z86.0100

## 2010-08-05 ENCOUNTER — Encounter: Payer: Self-pay | Admitting: Internal Medicine

## 2010-08-05 NOTE — Progress Notes (Signed)
  Phone Note Call from Patient   Caller: Patient Call For: Jacques Navy MD Summary of Call: patient left voice mail that he was intolerant of amaryl - made him feel sick. Plan - can call in actos 15mg  1 by mouth once daily # 30. refill as needed.  Please call patient and let him know.  [phone]; [phone] Thanks Initial call taken by: Jacques Navy MD,  June 22, 2010 1:43 PM  Follow-up for Phone Call        pt informed. rx sent to [facility]. Follow-up by: Brenton Grills CMA Duncan Dull),  June 22, 2010 3:07 PM    New/Updated Medications: ACTOS 15 MG TABS (PIOGLITAZONE HCL) take 1 tablet by mouth once daily Prescriptions: ACTOS 15 MG TABS (PIOGLITAZONE HCL) take 1 tablet by mouth once daily  #30 x 5   Entered by:   Brenton Grills CMA (AAMA)   Authorized by:   Jacques Navy MD   Signed by:   Brenton Grills CMA (AAMA) on 06/22/2010   Method used:   Electronically to        [facility]. [phone]* (retail)       [phone] [address].       [place], [place]  [address]       Ph: [phone]       Fax: [phone]   RxID:   [id number]

## 2010-08-05 NOTE — Assessment & Plan Note (Signed)
Summary: f45m   Primary Provider:  Norins   History of Present Illness: Mr. Delfin is 70 years old and return for followup management of CAD. On January 23, 1999 and he was admitted with increasing angina and underwent stenting of the LAD and the diagonal branch with drug-eluting stents. He's done quite well since that time and is back to jogging a regular basis without chest pain or other cardiac symptoms.  His other problems include hyperlipidemia and diabetes. And lipid profile just recently which was excellent which showed a total cholesterol of 111 and HDL of 47 and LDL of 49. His hemoglobin A1c was 7.0 recently. Dr. Debby Bud had tried him on 2 diabetes medications which he could not tolerate and he is getting ready to try a third which is Actos.  Current Medications (verified): 1)  Viagra 100 Mg Tabs (Sildenafil Citrate) .... Take 1 Tablet By Mouth Once A Day As Needed 2)  Aspirin Ec 325 Mg Tbec (Aspirin) .... Take One Tablet By Mouth Daily 3)  Plavix 75 Mg Tabs (Clopidogrel Bisulfate) .Marland Kitchen.. 1 Tab Once Daily 4)  Crestor 20 Mg Tabs (Rosuvastatin Calcium) .... Take One Tablet By Mouth Daily. 5)  Zolpidem Tartrate 10 Mg Tabs (Zolpidem Tartrate) .Marland Kitchen.. 1 By Mouth At Bedtime As Needed 6)  Nitroglycerin 0.4 Mg Subl (Nitroglycerin) .... One Tablet Under Tongue Every 5 Minutes As Needed For Chest Pain---May Repeat Times Three As Needed 7)  Metmucil .... As Needed Maybe Once Every 2 Weeks 8)  Tylenol 325 Mg Tabs (Acetaminophen) .... As Needed For Lower Back Pain 9)  Actos 15 Mg Tabs (Pioglitazone Hcl) .... Take One Tab By Mouth Once Daily  Allergies (verified): No Known Drug Allergies  Past History:  Past Medical History: Reviewed history from 01/31/2009 and no changes required. Hypertension thyroid nodules, sees Dr. Hillary Bow at [facility] Diabetes Hyperlipidemia CAD S/P DES LAD and Dx1 01/2009  Review of Systems       ROS is negative except as outlined in HPI.   Vital  Signs:  Patient profile:   70 year old male Height:      72 inches Weight:      176 pounds BMI:     23.96 Pulse rate:   63 / minute BP sitting:   112 / 70  (left arm)  Vitals Entered By: Laurance Flatten CMA (June 22, 2010 4:24 PM)  Physical Exam  Additional Exam:  Gen. Well-nourished, in no distress   Neck: No JVD, thyroid not enlarged, no carotid bruits Lungs: No tachypnea, clear without rales, rhonchi or wheezes Cardiovascular: Rhythm regular, PMI not displaced,  heart sounds  normal, no murmurs or gallops, no peripheral edema, pulses normal in all 4 extremities. Abdomen: BS normal, abdomen soft and non-tender without masses or organomegaly, no hepatosplenomegaly. MS: No deformities, no cyanosis or clubbing   Neuro:  No focal sns   Skin:  no lesions    Impression & Recommendations:  Problem # 1:  CAD, NATIVE VESSEL (ICD-414.01) In July of 2010 he had drug-eluting stents to the LAD and diagonal branch the LAD after presenting with exertional angina. He's done quite well since that time has had no recent chest pain this problem appears stable. We talked about the issue of continuing Plavix and we are both most comfortable with him continuing on this. He's had no significant problems from this. We will have him follow up with Dr. Excell Seltzer in 6 months after which he can probably be seen yearly. His updated medication list  for this problem includes:    Aspirin Ec 325 Mg Tbec (Aspirin) .Marland Kitchen... Take one tablet by mouth daily    Plavix 75 Mg Tabs (Clopidogrel bisulfate) .Marland Kitchen... 1 tab once daily    Nitroglycerin 0.4 Mg Subl (Nitroglycerin) ..... One tablet under tongue every 5 minutes as needed for chest pain---may repeat times three as needed  Orders: EKG w/ Interpretation (93000)  Problem # 2:  HYPERLIPIDEMIA-MIXED (ICD-272.4) His last lipid profile was excellent on his current medications. His updated medication list for this problem includes:    Crestor 20 Mg Tabs (Rosuvastatin calcium)  .Marland Kitchen... Take one tablet by mouth daily.  Problem # 3:  DIABETES-TYPE 2 (ICD-250.00) This is being managed by his primary care physician. The following medications were removed from the medication list:    Actos 15 Mg Tabs (Pioglitazone hcl) .Marland Kitchen... Take 1 tablet by mouth once daily His updated medication list for this problem includes:    Aspirin Ec 325 Mg Tbec (Aspirin) .Marland Kitchen... Take one tablet by mouth daily    Actos 15 Mg Tabs (Pioglitazone hcl) .Marland Kitchen... Take one tab by mouth once daily  Patient Instructions: 1)  Your physician recommends that you continue on your current medications as directed. Please refer to the Current Medication list given to you today. 2)  Your physician wants you to follow-up in:  6 months with Dr. Excell Seltzer. You will receive a reminder letter in the mail two months in advance. If you don't receive a letter, please call our office to schedule the follow-up appointment.

## 2010-08-05 NOTE — Progress Notes (Signed)
Summary: NEEDS OV  Phone Note Call from Patient Call back at Work Phone [phone]   Summary of Call: Pt contacted md, needs office visit. Left vm for pt to call office and schedule.  Initial call taken by: Lamar Sprinkles, CMA,  June 16, 2010 5:47 PM  Follow-up for Phone Call        Closing phone note, messg left to call and schedule.Alvy Beal Archie CMA  June 17, 2010 8:26 AM   Additional Follow-up for Phone Call Additional follow up Details #1::        Pt called and sched appt for 12/16@4 :05 pm. Additional Follow-up by: Verdell Face,  June 17, 2010 8:38 AM

## 2010-08-05 NOTE — Assessment & Plan Note (Signed)
Summary: per phone note/discuss A1C/cd   Vital Signs:  Patient profile:   70 year old male Height:      72 inches Weight:      175 pounds BMI:     23.82 O2 Sat:      97 % on Room air Temp:     96.9 degrees F oral Pulse rate:   75 / minute BP sitting:   110 / 78  (left arm) Cuff size:   large  Vitals Entered By: Bill Salinas CMA (June 18, 2010 4:36 PM)  O2 Flow:  Room air CC: office visit to discuss A1C/ ab   Primary Care Aidenn Skellenger:  Norins  CC:  office visit to discuss A1C/ ab.  History of Present Illness: patient presents for follow-up of DM. He had intolerance of metformin and stopped it several weeks ago. He came in for an A1C which came back at 7%. Mr. Detweiler is very conservative in regard to his care and wishes to have better control of his blood sugar                   Current Medications (verified): 1)  Viagra 100 Mg Tabs (Sildenafil Citrate) .... Take 1 Tablet By Mouth Once A Day As Needed 2)  Aspirin Ec 325 Mg Tbec (Aspirin) .... Take One Tablet By Mouth Daily 3)  Plavix 75 Mg Tabs (Clopidogrel Bisulfate) .Marland Kitchen.. 1 Tab Once Daily 4)  Crestor 20 Mg Tabs (Rosuvastatin Calcium) .... Take One Tablet By Mouth Daily. 5)  Zolpidem Tartrate 10 Mg Tabs (Zolpidem Tartrate) .Marland Kitchen.. 1 By Mouth At Bedtime As Needed 6)  Nitroglycerin 0.4 Mg Subl (Nitroglycerin) .... One Tablet Under Tongue Every 5 Minutes As Needed For Chest Pain---May Repeat Times Three As Needed 7)  Metmucil .... As Needed Maybe Once Every 2 Weeks 8)  Tylenol 325 Mg Tabs (Acetaminophen) .... As Needed For Lower Back Pain 9)  Metformin Hcl 500 Mg Tabs (Metformin Hcl) .Marland Kitchen.. 1 By Mouth Two Times A Day  Allergies (verified): No Known Drug Allergies  Past History:  Past Medical History: Last updated: 01/31/2009 Hypertension thyroid nodules, [facility] Hillary [facility] Diabetes Hyperlipidemia CAD S/P DES LAD and Dx1 01/2009  Past Surgical History: Last updated: 09/11/2007 colonoscopy  2008 per Dr. Melchor Amour, several polyps FH reviewed for relevance, SH/Risk Factors reviewed for relevance  Review of Systems  The patient denies anorexia, fever, weight loss, weight gain, hoarseness, chest pain, dyspnea on exertion, abdominal pain, muscle weakness, and unusual weight change.    Physical Exam  General:  alert, well-developed, well-nourished, and well-hydrated.   Head:  normocephalic and atraumatic.   Eyes:  pupils equal and pupils round.  C&S clear Lungs:  normal respiratory effort.   Heart:  normal rate and regular rhythm.   Neurologic:  alert & oriented X3 and gait normal.   Skin:  turgor normal and color normal.     Impression & Recommendations:  Problem # 1:  DIABETES-TYPE 2 (ICD-250.00) Patinet was intolerant of metformin and stopped medication. Now A1C up from 6.4% to 7%  Plan - glimeiride 2mg  once daily           repeat A1C in 3 months.  His updated medication list for this problem includes:    Aspirin Ec 325 Mg Tbec (Aspirin) .Marland Kitchen... Take one tablet by mouth daily    Glimepiride 2 Mg Tabs (Glimepiride) .Marland Kitchen... 1 by mouth once daily  Complete Medication List: 1)  Viagra 100 Mg Tabs (Sildenafil citrate) .Marland KitchenMarland KitchenMarland Kitchen  Take 1 tablet by mouth once a day as needed 2)  Aspirin Ec 325 Mg Tbec (Aspirin) .... Take one tablet by mouth daily 3)  Plavix 75 Mg Tabs (Clopidogrel bisulfate) .Marland Kitchen.. 1 tab once daily 4)  Crestor 20 Mg Tabs (Rosuvastatin calcium) .... Take one tablet by mouth daily. 5)  Zolpidem Tartrate 10 Mg Tabs (Zolpidem tartrate) .Marland Kitchen.. 1 by mouth at bedtime as needed 6)  Nitroglycerin 0.4 Mg Subl (Nitroglycerin) .... One tablet under tongue every 5 minutes as needed for chest pain---may repeat times three as needed 7)  Metmucil  .... As needed maybe once every 2 weeks 8)  Tylenol 325 Mg Tabs (Acetaminophen) .... As needed for lower back pain 9)  Glimepiride 2 Mg Tabs (Glimepiride) .Marland Kitchen.. 1 by mouth once daily Prescriptions: GLIMEPIRIDE 2 MG TABS (GLIMEPIRIDE) 1 by mouth  once daily  #30 x 12   Entered and Authorized by:   Jacques Navy MD   Signed by:   Jacques Navy MD on 06/18/2010   Method used:   Electronically to        [facility]. [phone]* (retail)       [phone] [address].       [place], [place]  [address]       Ph: [phone]       Fax: [phone]   RxID:   [id number]    Orders Added: 1)  Est. Patient Level III [02725]

## 2010-08-25 NOTE — Letter (Signed)
Summary: General Surgery/[facility]  General Surgery/[facility]   Imported By: Sherian Rein 08/16/2010 10:47:38  _____________________________________________________________________  External Attachment:    Type:   Image     Comment:   External Document

## 2010-09-20 ENCOUNTER — Other Ambulatory Visit: Payer: Self-pay

## 2010-10-05 ENCOUNTER — Other Ambulatory Visit: Payer: Self-pay | Admitting: Internal Medicine

## 2010-10-05 NOTE — Telephone Encounter (Signed)
Ok for REFILL?

## 2010-10-06 NOTE — Telephone Encounter (Signed)
Ok for refill x 11 

## 2010-10-07 NOTE — Telephone Encounter (Signed)
Ok prn 

## 2010-10-09 LAB — GLUCOSE, CAPILLARY
Glucose-Capillary: 146 mg/dL — ABNORMAL HIGH (ref 70–99)
Glucose-Capillary: 147 mg/dL — ABNORMAL HIGH (ref 70–99)

## 2010-10-10 LAB — GLUCOSE, CAPILLARY
Glucose-Capillary: 105 mg/dL — ABNORMAL HIGH (ref 70–99)
Glucose-Capillary: 109 mg/dL — ABNORMAL HIGH (ref 70–99)

## 2010-10-10 LAB — CBC
HCT: 38.6 % — ABNORMAL LOW (ref 39.0–52.0)
MCHC: 34.9 g/dL (ref 30.0–36.0)
Platelets: 131 10*3/uL — ABNORMAL LOW (ref 150–400)
RDW: 12.9 % (ref 11.5–15.5)

## 2010-10-10 LAB — BASIC METABOLIC PANEL
CO2: 27 mEq/L (ref 19–32)
Chloride: 106 mEq/L (ref 96–112)
Creatinine, Ser: 0.87 mg/dL (ref 0.4–1.5)
GFR calc Af Amer: >60 mL/min (ref >60)
Sodium: 140 mEq/L (ref 135–145)

## 2010-10-11 LAB — RAPID STREP SCREEN (MED CTR MEBANE ONLY): Streptococcus, Group A Screen (Direct): NEGATIVE

## 2010-10-11 LAB — STREP A DNA PROBE: Group A Strep Probe: NEGATIVE

## 2010-10-23 ENCOUNTER — Emergency Department (HOSPITAL_COMMUNITY)
Admission: EM | Admit: 2010-10-23 | Discharge: 2010-10-23 | Disposition: A | Discharge status: Home / Self Care | Payer: Medicare Other | Attending: Emergency Medicine | Admitting: Emergency Medicine

## 2010-10-23 DIAGNOSIS — R030 Elevated blood-pressure reading, without diagnosis of hypertension: Secondary | ICD-10-CM | POA: Insufficient documentation

## 2010-10-23 DIAGNOSIS — E119 Type 2 diabetes mellitus without complications: Secondary | ICD-10-CM | POA: Insufficient documentation

## 2010-10-23 DIAGNOSIS — H9209 Otalgia, unspecified ear: Secondary | ICD-10-CM | POA: Insufficient documentation

## 2010-10-23 DIAGNOSIS — H6120 Impacted cerumen, unspecified ear: Secondary | ICD-10-CM | POA: Insufficient documentation

## 2010-11-16 NOTE — Discharge Summary (Signed)
NAME:  Alex Meyers, Alex Meyers NO.:  [id number]   MEDICAL RECORD NO.:  [id number]          PATIENT TYPE:  INP   LOCATION:  2507                         FACILITY:  [facility]   PHYSICIAN:  Alex Morton. Riley Kill, MD, FACCDATE OF BIRTH:  [birth year]   DATE OF ADMISSION:  01/22/2009  DATE OF DISCHARGE:  01/23/2009                               DISCHARGE SUMMARY   PRIMARY CARDIOLOGIST:  Alex Beals. Juanda Chance, MD, [facility]   PRIMARY CARE PHYSICIAN:  Alex Gess. Norins, MD   DISCHARGE DIAGNOSES:  1. Coronary artery disease status post cardiac catheterization this      admission.  The patient found to have left main coronary artery      free of significant disease.  Left anterior descending had 90%      stenosis of proximal left anterior descending.  First diagonal      branch had a 95% lesion in the mid to distal vessel.  Circumflex      appeared to be free of significant disease.  Right coronary artery      slightly irregular but no significant obstruction.  Ejection      fraction calculated at 55%.  The patient underwent stenting of the      lesion of the proximal left anterior descending with intracoronary      vascular ultrasound followup.  The patient tolerated the procedure      without complications.  2. Borderline hypertension.  3. Diabetes, new onset.  The patient apparently had a hemoglobin A1c      of 6.9 on January 01, 2009, and his glucose level throughout this      hospitalization have been 116-130 range.  The patient states this      is new for him.  He is not aware of any diagnosis of diabetes, and      he would like to follow up outpatient with Alex Meyers for further      testing and management.  I have repeated a hemoglobin A1c on this      date, results are pending at this time.  4. Irritable bowel syndrome.  5. Hemorrhoids.  6. Questionable diagnosis of mood disorder according to Alex Meyers'      previous note.   HOSPITAL COURSE:  Alex Meyers was seen in the office on January 21, 2009,  for complaints of chest discomfort, exertional fatigue.  The patient is  an avid runner, usually runs about 6 miles a day.  He was set up for a  stress Myoview by Alex Meyers.  The patient did experience some minimal  chest discomfort.  The test was stopped secondary to fatigue.  At the  end of the stress test, had upsloping of ST-segment depression  inferolaterally and a couple of minutes in recovery he developed ST-  segment depression that met criteria for ischemic changes.  His EF was  calculated 51%.  His risk factors for heart disease include age, sex,  mild hyperlipidemia, new-onset glucose intolerance, and hypertension.  The patient was scheduled for a cardiac catheterization.  He presented  for this and underwent cardiac catheterization  and PCI as stated above.  No complications from procedure.  Dr. Riley Meyers in to see the patient on  day of discharge.  Blood pressure 114/68, heart rate 58, afebrile.  CBG  is 111 this a.m.  Potassium was low at 3.2 and treated.  Creatinine 0.8,  glucose 130.  Hematocrit 38.6, platelets 131,000.   The patient being discharged home with:  1. Aspirin 325.  2. Plavix 75.  3. Crestor 20.  4. K-Dur, he has 20 mEq tablet, he is take half a tablet daily.  5. Viagra p.r.n., the patient instructed to use cautiously with      nitroglycerin.  6. Ambien as previously prescribed.  7. We have stopped his hydrochlorothiazide.   The patient will follow up with Dr. Juanda Meyers on February 02, 2009, at 1:45.  He has been given the postcardiac catheterization discharge instructions  and information regarding diabetes and basic diabetic education.  He  will follow up with Alex Meyers for further management.   DURATION OF DISCHARGE ENCOUNTER:  Over 30 minutes.      Alex Meyers, ACNP      Alex Morton. Riley Kill, MD, [facility]  Electronically Signed    MB/MEDQ  D:  01/23/2009  T:  01/24/2009  Job:  [id number]

## 2010-11-16 NOTE — Letter (Signed)
February 13, 2009    To Whom It May Concern   RE:  CLAUS, SILVESTRO  MRN:  [id number]  /  DOB:  [birth year]   Dear Milford Cage,   [name] has been followed in my practice for general medical  care.  Patient has been dedicated to his health.  He exercises on a  regular basis and is very compliant with medical recommendations.   The patient recently has been diagnosed with new onset diabetes as  demonstrated by an elevated hemoglobin A1c.  The patient also  subsequently was diagnosed with coronary artery disease with a two-  vessel disease requiring stent placement.   Clearly there is an association between the patient's newly diagnosed  diabetes which probably reflects a problem with glucose metabolism for  some time that has increased his risk by 18 fold for having coronary  artery disease.  This is certainly an underlying mechanism for the  patient's development of coronary artery disease despite a very healthy  lifestyle.   If I can provide any additional information in regards to Mr. [name]'s  past medical history or current healthcare I will be happy to do so with  the appropriate medical release forms from the patient.    Sincerely,     Rosalyn Gess. Norins, MD  Electronically Signed   MEN/MedQ  DD: 02/13/2009  DT: 02/13/2009  Job #: [id number]   CC:    Mr. [name]

## 2010-11-16 NOTE — Cardiovascular Report (Signed)
NAME:  Alex Meyers, Alex Meyers               ACCOUNT NO.:  [id number]   MEDICAL RECORD NO.:  [id number]          PATIENT TYPE:  INP   LOCATION:  2507                         FACILITY:  [facility]   PHYSICIAN:  Bruce R. Juanda Chance, MD, FACCDATE OF BIRTH:  07-Jan-1941   DATE OF PROCEDURE:  01/22/2009  DATE OF DISCHARGE:                            CARDIAC CATHETERIZATION   CLINICAL HISTORY:  Alex Meyers is 70 years old and is personal friend  of mine.  He has been having some exertional chest pain while jogging  and saw Illene Regulus who arranged for him to have Myoview scan.  This  was abnormal with apical ischemia.  He saw Valera Castle in the office who  scheduled him for evaluation with angiography today.  He was recently  diagnosed with diabetes which is being managed with diet.  He is an avid  running and runs 4-6 miles a day.   PROCEDURE:  The procedure was performed via the right femoral as an  arterial sheath and 5-French preformed coronary catheters.  A front wall  arterial puncture was performed, and Omnipaque contrast was used.  After  completion of the diagnostic study, we made a decision to proceed with  intervention on the lesion in the very proximal LAD and the large first  diagonal branch.  The lesion ended just before bifurcation into the LAD  and a large diagonal branch and our plan was to end the stent just  before the bifurcation.   The patient was given Angiomax bolus infusion and was given 600 mg of  Plavix at 20 mg of Pepcid.  He had previously been given 4 chewable  aspirins.  We used an XP LAD 6-French guiding catheter with side holes.  We passed a P[place]ater wire across the lesion of the LAD and into the  diagonal branch.  We predilated with a 2.25- x 12-mm apex balloon  performing 1 inflation of 9 atmospheres for 30 seconds.  We then  performed IVUS with the Atlantis IVUS catheter and automatic pullback.  Based on the IVUS readings, we chose a 3.5- x 8-mm XIENCE stent.  We  positioned this just proximal to the bifurcation of the LAD and diagonal  branch and just near the ostium of the LAD.  We deployed this with 1  inflation of 14 atmospheres for 30 seconds.  We then postdilated with a  4.0- x 6-mm [place] apex following 1 inflation of 8 atmospheres for 30  seconds.  We then navigated a PT2 Light Support wire down the diagonal  branch and across the lesion and distal vessel.  This was accomplished  with some difficulty.  We then predilated the lesion with 2.25- x 12-mm  apex balloon.  We stented the lesion with a 2.5- x 15-mm XIENCE stent  deploying this with 1 inflation of 14 atmospheres for 30 seconds.  We  postdilated with a 3.0- x 12-mm [place] apex performing 1 inflation up to 14  atmospheres for 30 seconds.  We then performed the final IVUS run to  evaluate the proximal stent.  The right femoral artery was closed with  Angio-Seal at the end of the procedure.  The patient tolerated the  procedure well and left the laboratory in satisfactory condition.   RESULTS:  1. Left main coronary artery:  Left main coronary artery was free of      significant disease.  2. Left anterior descending artery:  Left anterior descending artery      had a 90% stenosis in the proximal LAD just before the bifurcation      to the LAD and a large diagonal branch.  There were irregularities      throughout the LAD, and there was a 40% lesion in the proximal LAD.      First diagonal branch had a 95% lesion in the mid-to-distal vessel.      This was a fairly large vessel.  3. Circumflex artery:  Circumflex artery was a small vessel and gave      rise to a marginal branch and a posterolateral branch.  These      vessels appeared to be free of significant disease.  4. Right coronary artery:  Right coronary artery was a moderately      large vessel, gave rise to conus branch, right left ventricular      branch, posterior descending branch, and 2 posterolateral branches.      This vessel was  slightly irregular, but there was no significant      obstruction.  5. Left ventriculogram:  Left ventriculogram performed in the RAO      projection showed slight hypokinesis of the apex.  The overall wall      motion was good with an estimated ejection fraction of 50-55%.   Following stenting of the lesion of the proximal LAD, the stenosis  improved from 90% to 0%.  The post IVUS run showed good expansion of the  stent with the dimensions of 3.5 or greater.   Following stenting of the lesion in the diagonal branch, stenosis  improved from 95% to 0%.   CONCLUSION:  1. Coronary artery disease with 90% stenosis in the proximal left      anterior descending and 40% narrowing in the proximal and the mid      left anterior descending and 95% stenosis in the first large      diagonal branch in its distal portion, no major obstruction of the      circumflex and right coronary arteries, mild apical wall      hypokinesis with an estimated fraction of 50%.  2. Successful intracoronary vascular ultrasound-guided percutaneous      coronary intervention of the lesion in the proximal left anterior      descending using a XIENCE drug-eluting stent with improvement in      center narrowing from 90% to 0%.  3. Successful percutaneous coronary intervention of the lesion in the      diagonal branch of the left anterior descending using a XIENCE drug-      eluting stent with improvement in center narrowing from 95% to 0%.   DISPOSITION:  The patient returned to post angio room for further  observation.  He should remain on Plavix at least a year.      Bruce Elvera Lennox Juanda Chance, MD, [facility]  Electronically Signed     Everardo Beals. Juanda Chance, MD, [facility]  Electronically Signed    BRB/MEDQ  D:  01/22/2009  T:  01/23/2009  Job:  [id number]   cc:   Rosalyn Gess. Norins, MD  Jesse Sans. Daleen Squibb, MD, [facility]  Cardiopulmonary Lab.

## 2010-11-19 NOTE — Assessment & Plan Note (Signed)
[facility]                           PRIMARY CARE OFFICE NOTE   NAME:Alex Meyers, Alex Meyers                      MRN:          [id number]  DATE:07/11/2006                            DOB:          05-27-1941    Alex Meyers is a 70 year old Caucasian gentleman who presents for  followup evaluation and exam.  He was last seen May 03, 2005; please  see that dictation.  Of note, the patient had a concern at that time  about human papillomavirus and its epidemiology.  He was provided with a  chapter from UpToDate on the epidemiology of HPV.  He was again  concerned about this issue.  I re-reviewed UpToDate for any new  information.  Basically it remains the same in that there are no  transmission studies for male.  The only studies that have been done are  in men who have sex with men, in which there has been found up to a 57%  prevalence of HPV 16.  There is no screening strategy recommended by the  CDC at this time.  The most usual clinical presentation would be genital  warts, condyloma acuminatum, Bowen's disease or other anogenital warts.  Currently there is no recommendation for screenings of men.  Prevention  of transmission is not well developed in that condoms do not necessarily  protect from the spread of HPV, particularly when there are anogenital  warts or other obvious signs of clinical inflammation or infection.  Use  of vaccine is not recommended at this time for adult males.  Studies are  underway and looking at immunization of adolescent males who are  presexual.   The patient is concerned about blood pressure.  We addressed this in his  last visit as well.  He had been started on a low dose  hydrochlorothiazide because of mildly elevated systolic pressures.  The  patient has not been monitoring his blood pressure at home.   The patient is due for colorectal cancer screening with his last exam  having been 2004 by Dr. Boyd Kerbs with  recommendation for follow up  after 3 years, making him overdue.  Appointment was established for the  patient with Dr. Sherin Quarry, I believe January 29, but I did not document  the date, which he did.   The patient has had problems with internal hemorrhoids, with recurrent  problems with blood on the stool occurring every several weeks.  He has  been using Anusol HC 2.5% on an as needed basis, but rather frequently.  He has had no pain or discomfort.   The patient is troubled by insomnia and has been using zolpidem with  success, although his insurance plan has been limiting his prescriptions  to 20 per month.   The patient has been followed for thyroid nodules.  His most recent  ultrasound was at [facility] June 20, 2006, which showed that  the nodules were stable.  It was recommended that he have follow up in 9-  12 months, and he actually is scheduled for followup ultrasound in 6  months.   PAST MEDICAL  HISTORY:  Surgical:  None.   Medical:  1. Usual childhood disease.  2. IBS.  3. Hemorrhoids.  4. Possible mood disorder.  5. History of epididymitis.  6. Onychomycosis.  7. 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  8. Shoulder discomfort.  9. Dry eyes, treated with Patanol.   CURRENT MEDICATIONS:  1. Hydrochlorothiazide 12.5 mg daily.  2. Metamucil daily.  3. Ambien 10 mg nightly.  4. Anusol HC suppositories.  5. Viagra taken p.r.n.   FAMILY HISTORY:  Positive for CVA in his father, positive for  hypertension in his father, positive for osteoarthritis in his mother,  negative for colon cancer, CAD.   SOCIAL HISTORY:  The patient is divorced after 22 years of marriage.  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  He has 3 daughters  whom he sees on a regular basis.  The patient does have a long-term  monogamous relationship.   REVIEW OF SYSTEMS:  The patient has had no fevers, sweats, chills.  His  weight  has been stable, insomnia as described above.  No ophthal  problems, no ENT, cardiovascular, respiratory, GI, GU, musculoskeletal,  or dermatologic problem.  The patient does have a long standing history  of osteoarthritis with Heberden's nodes and some enlargement of the PIP  joints.  He has had a problem with a hamstring pull.  For dermatologic  maintenance, the patient does see Dr. Dorinda Hill on a regular  basis.   CHART REVIEW:  The patient's last colonoscopy by Dr. Boyd Kerbs was in  2004 with several polyps, with patient being a candidate for follow up  at this time.  The patient is followed by Dr. Hillary Bow at [facility] for his thyroid nodules as noted.   LIMITED EXAMINATION:  Temperature was 97, blood pressure 138/86, weight  is 186, height 6 foot 1 inches.  GENERAL APPEARANCE:  A well-nourished, well-developed, athletic-  appearing gentleman in no acute distress.  HEENT:  Normocephalic, atraumatic.  EACs and TMs were unremarkable.  Oropharynx with native dentition in good repair, no buccal or palate  lesions were noted, posterior pharynx was clear.  Conjunctivae and  sclerae were clear, pupils equal, round, reactive to light and  accommodation.  Funduscopic exam with a handheld instrument revealed no  abnormalities.  NECK:  Supple without thyromegaly, nodes, no adenopathy was noted in the  cervical or supraclavicular regions.  CHEST:  No CVA tenderness.  LUNGS:  Clear to auscultation and percussion.  CARDIOVASCULAR:  With a 2+ radial pulse, no JVD or carotid bruits.  He  had a quiet precordium with regular rate and rhythm without murmurs,  rubs, or gallops.  ABDOMEN:  Soft, no guarding, no rebound, no organosplenomegaly was  noted.  GENITALIA:  Deferred.  RECTAL:  Deferred to upcoming colonoscopy and I would ask that Dr.  Sherin Quarry perform a prostate exam at that time. EXTREMITIES:  Without clubbing, cyanosis, edema, or deformity.  NEUROLOGIC:   Nonfocal.   LABORATORY:  The patient will return for lipid panel, basic metabolic  panel with chemistries.   ASSESSMENT AND PLAN:  1. Hypertension.  The patient's blood pressure was mildly elevated in      the office today at 138/86.  I have recommended the patient obtain      a home blood pressure monitor to check random blood pressures.  If      he continues to have elevated systolics, he  would be a candidate      for either increasing hydrochlorothiazide or consideration of a      second agent.  2. Colorectal cancer screening.  The patient is scheduled for an      appointment with Dr. Sherin Quarry for colonoscopy as noted.  Hopefully,      the internal hemorrhoids would be addressed at that time.  If the      patient continues to have ongoing problems and he does have large      internal hemorrhoids, I would recommend consultation with general      surgery for definitive treatment.  3. Insomnia.  The patient is advised to use generic zolpidem 10 mg      nightly and he may need to buy a cash prescription.  4. Endocrine.  The patient's thyroid nodules are being followed      conservatively with serial ultrasounds.  He has been stable.  5. Health maintenance.  Discussion of human papillomavirus as above.      I do not have any additional information except as outlined.  The      patient will have routine laboratories as noted.   SUMMARY:  This is a very pleasant gentleman who seems to medically  stable at this time.  He will be notified by phone tree as to his  laboratory results when available.  He will return to see me on an as  needed basis.     Rosalyn Gess Norins, MD  Electronically Signed    MEN/MedQ  DD: 07/12/2006  DT: 07/12/2006  Job #: [id number]   cc:   Irene Shipper, M.D.

## 2010-12-07 ENCOUNTER — Encounter: Payer: Self-pay | Admitting: Cardiovascular Disease

## 2010-12-24 ENCOUNTER — Other Ambulatory Visit: Payer: Self-pay | Admitting: Internal Medicine

## 2010-12-24 DIAGNOSIS — E041 Nontoxic single thyroid nodule: Secondary | ICD-10-CM

## 2010-12-24 DIAGNOSIS — E785 Hyperlipidemia, unspecified: Secondary | ICD-10-CM

## 2010-12-24 DIAGNOSIS — M159 Polyosteoarthritis, unspecified: Secondary | ICD-10-CM

## 2010-12-24 DIAGNOSIS — I10 Essential (primary) hypertension: Secondary | ICD-10-CM

## 2010-12-24 DIAGNOSIS — E119 Type 2 diabetes mellitus without complications: Secondary | ICD-10-CM

## 2010-12-24 DIAGNOSIS — Z125 Encounter for screening for malignant neoplasm of prostate: Secondary | ICD-10-CM

## 2010-12-27 ENCOUNTER — Other Ambulatory Visit (INDEPENDENT_AMBULATORY_CARE_PROVIDER_SITE_OTHER): Payer: BC Managed Care – PPO

## 2010-12-27 ENCOUNTER — Encounter: Payer: Self-pay | Admitting: Internal Medicine

## 2010-12-27 DIAGNOSIS — E119 Type 2 diabetes mellitus without complications: Secondary | ICD-10-CM

## 2010-12-27 DIAGNOSIS — I10 Essential (primary) hypertension: Secondary | ICD-10-CM

## 2010-12-27 DIAGNOSIS — E785 Hyperlipidemia, unspecified: Secondary | ICD-10-CM

## 2010-12-27 DIAGNOSIS — E041 Nontoxic single thyroid nodule: Secondary | ICD-10-CM

## 2010-12-27 DIAGNOSIS — Z125 Encounter for screening for malignant neoplasm of prostate: Secondary | ICD-10-CM

## 2010-12-27 LAB — LIPID PANEL
HDL: 54.5 mg/dL (ref >39.00)
LDL Cholesterol: 53 mg/dL (ref 0–99)
Total CHOL/HDL Ratio: 2
VLDL: 8.6 mg/dL (ref 0.0–40.0)

## 2010-12-27 LAB — COMPREHENSIVE METABOLIC PANEL
ALT: 30 U/L (ref 0–53)
AST: 27 U/L (ref 0–37)
Creatinine, Ser: 0.9 mg/dL (ref 0.4–1.5)
Total Bilirubin: 0.8 mg/dL (ref 0.3–1.2)

## 2010-12-27 LAB — T4, FREE: Free T4: 0.8 ng/dL (ref 0.60–1.60)

## 2010-12-27 LAB — HEMOGLOBIN A1C: Hgb A1c MFr Bld: 7.4 % — ABNORMAL HIGH (ref 4.6–6.5)

## 2010-12-27 LAB — PSA: PSA: 1.07 ng/mL (ref 0.10–4.00)

## 2010-12-27 LAB — TSH: TSH: 2.21 u[IU]/mL (ref 0.35–5.50)

## 2010-12-28 ENCOUNTER — Encounter: Payer: Self-pay | Admitting: Internal Medicine

## 2010-12-28 ENCOUNTER — Ambulatory Visit (INDEPENDENT_AMBULATORY_CARE_PROVIDER_SITE_OTHER): Payer: BC Managed Care – PPO | Admitting: Internal Medicine

## 2010-12-28 ENCOUNTER — Encounter: Payer: Self-pay | Admitting: Cardiovascular Disease

## 2010-12-28 ENCOUNTER — Ambulatory Visit (INDEPENDENT_AMBULATORY_CARE_PROVIDER_SITE_OTHER): Payer: BC Managed Care – PPO | Admitting: Cardiovascular Disease

## 2010-12-28 VITALS — BP 125/81 | HR 75 | Ht 72.0 in | Wt 172.8 lb

## 2010-12-28 DIAGNOSIS — E785 Hyperlipidemia, unspecified: Secondary | ICD-10-CM

## 2010-12-28 DIAGNOSIS — I2510 Atherosclerotic heart disease of native coronary artery without angina pectoris: Secondary | ICD-10-CM

## 2010-12-28 DIAGNOSIS — I10 Essential (primary) hypertension: Secondary | ICD-10-CM

## 2010-12-28 DIAGNOSIS — E041 Nontoxic single thyroid nodule: Secondary | ICD-10-CM

## 2010-12-28 DIAGNOSIS — Z1211 Encounter for screening for malignant neoplasm of colon: Secondary | ICD-10-CM

## 2010-12-28 DIAGNOSIS — E119 Type 2 diabetes mellitus without complications: Secondary | ICD-10-CM

## 2010-12-28 DIAGNOSIS — Z0000 Encounter for general adult medical examination without abnormal findings: Secondary | ICD-10-CM | POA: Insufficient documentation

## 2010-12-28 MED ORDER — ASPIRIN 81 MG PO TABS: 81.0000 mg | ORAL_TABLET | Freq: Every day | ORAL | Status: DC

## 2010-12-28 MED ORDER — GLIMEPIRIDE 2 MG PO TABS: 2.0000 mg | ORAL_TABLET | ORAL | Status: DC

## 2010-12-28 NOTE — Assessment & Plan Note (Signed)
Lab Results  Component Value Date   HGBA1C 7.4* 12/27/2010   HGBA1C 7.0* 06/17/2010   HGBA1C 6.7* 11/26/2009   Lab Results  Component Value Date   LDLCALC 53 12/27/2010   CREATININE 0.9 12/27/2010   Steady rise in A1C to 7.4%. He never started actos for fear of bladder cancer. He is informed that the [place] study has been discredited and there is no FDA prohibition in use of Actos.  Plan - redoubled effort on diet, especially carbs.          Will start amaryl 2mg  once a day.          Follow-up A1C in 3 months.

## 2010-12-28 NOTE — Assessment & Plan Note (Signed)
Lipids are excellent with an HDL of 55 and an LDL of 53 on Crestor 20 mg daily.

## 2010-12-28 NOTE — Patient Instructions (Signed)
Your physician has requested that you have an exercise tolerance test. For further information please visit [email or web address] Please also follow instruction sheet, as given.  Your physician has recommended you make the following change in your medication: Decrease aspirin to 81 mg by mouth daily.

## 2010-12-28 NOTE — Assessment & Plan Note (Signed)
Well-controlled without specific antihypertensive medication at present.

## 2010-12-28 NOTE — Assessment & Plan Note (Signed)
BP Readings from Last 3 Encounters:  12/28/10 122/82  06/22/10 112/70  06/18/10 110/78   Very good control. No change in regimen.

## 2010-12-28 NOTE — Assessment & Plan Note (Signed)
The patient is stable without angina. I've asked him to reduce his aspirin dose to 81 mg daily. He will undergo an exercise treadmill stress test for CAD surveillance. Otherwise no medicine changes were made today.

## 2010-12-28 NOTE — Assessment & Plan Note (Signed)
Excellent control on present medical regimen.  Plan - continue present medications.

## 2010-12-28 NOTE — Assessment & Plan Note (Signed)
Followed at [facility] with recent evaluation - he remains stable. Thyroid function is normal.

## 2010-12-28 NOTE — Assessment & Plan Note (Signed)
Interval history is negative. Physical exam is normal. Lab results are normal except for A1C, including normal PSA. Date of last colonoscopy not known. Dr. Melchor Amour has retired and he would like to move care to [facility] - will refer to Dr. Christella Hartigan. Immunizations - he will return for Tetanus and pnuemonia and shingles vaccine.  IN summary- a very nice man who is medically stable at this time. He will return for lab in 3 months.

## 2010-12-28 NOTE — Progress Notes (Signed)
Subjective:    Patient ID: Alex Meyers, male    DOB: [birth year], 70 y.o.   MRN: [id number]  HPI The patient is here for annual wellness examination and management of other chronic and acute problems. He has generally feeling well.    The risk factors are reflected in the social history.  The roster of all physicians providing medical care to patient - is listed in the Snapshot section of the chart.  Activities of daily living:  The patient is 100% inedpendent in all ADLs: dressing, toileting, feeding as well as independent mobility  Home safety : The patient has smoke detectors in the home. They wear seatbelts. No firearms at home. There is no violence in the home.   There is no risks for hepatitis, STDs or HIV. There is no history of blood transfusion. They have no travel history to infectious disease endemic areas of the world.  The patient has seen their dentist in the last six month. They have not seen their eye doctor in the last year. They deny any hearing difficulty and have not had audiologic testing in the last year.  They do not  have excessive sun exposure. Discussed the need for sun protection: hats, long sleeves and use of sunscreen if there is significant sun exposure.   Diet: the importance of a healthy diet is discussed. They do have a healthy diet.  The patient has a regular exercise program: yes,  1 hr duration,  6 per week.  The benefits of regular aerobic exercise were discussed.  Depression screen: there are no signs or vegative symptoms of depression- irritability, change in appetite, anhedonia, sadness/tearfullness.  Cognitive assessment: the patient manages all their financial and personal affairs and is actively engaged. They could relate day,date,year and events; recalled 3/3 objects at 3 minutes.  The following portions of the patient's history were reviewed and updated as appropriate: allergies, current medications, past family history, past medical history,   past surgical history, past social history  and problem list.  Vision, hearing, body mass index were assessed and reviewed.   During the course of the visit the patient was educated and counseled about appropriate screening and preventive services including : fall prevention , diabetes screening, nutrition counseling, colorectal cancer screening, and recommended immunizations.  Past Medical History  Diagnosis Date  . Hypertension   . Diabetes mellitus   . Hyperlipidemia   . Coronary artery disease     s//p DES LAD and Dx1 01/2009  . Multiple thyroid nodules    Past Surgical History  Procedure Date  . Colonoscopy 2008    per Dr. Melchor Amour, several polyps   Family History  Problem Relation Age of Onset  . Heart failure Mother   . Coronary artery disease Mother   . Other Mother     CHF  . Cancer Father 79    bladder    History   Social History  . Marital Status: Divorced    Spouse Name: N/A    Number of Children: N/A  . Years of Education: N/A   Occupational History  . Not on file.   Social History Main Topics  . Smoking status: Never Smoker   . Smokeless tobacco: Not on file  . Alcohol Use: Not on file  . Drug Use: Not on file  . Sexually Active: Not on file   Other Topics Concern  . 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 Serially monogamous, does not always practice safe sex.        Review of Systems Review of Systems  Constitutional:  Negative for fever, chills, activity change and unexpected weight change.  HEENT:  Negative for hearing loss, ear pain, congestion, neck stiffness and postnasal drip. Negative for sore throat or swallowing problems. Negative for dental complaints.   Eyes: Negative for vision loss or change in visual acuity.  Respiratory: Negative for chest tightness and wheezing.   Cardiovascular: Negative  for chest pain and palpitation. No decreased exercise tolerance Gastrointestinal: No change in bowel habit. No bloating or gas. No reflux or indigestion Genitourinary: Negative for urgency, frequency, flank pain and difficulty urinating.  Musculoskeletal: Negative for myalgias, back pain, arthralgias and gait problem.  Neurological: Negative for dizziness, tremors, weakness and headaches.  Hematological: Negative for adenopathy.  Psychiatric/Behavioral: Negative for behavioral problems and dysphoric mood.   Lab Results  Component Value Date   WBC 4.8 11/26/2009   HGB 14.0 11/26/2009   HCT 40.0 11/26/2009   PLT 149.0* 11/26/2009   CHOL 116 12/27/2010   TRIG 43.0 12/27/2010   HDL 54.50 12/27/2010   ALT 30 12/27/2010   AST 27 12/27/2010   NA 138 12/27/2010   K 3.9 12/27/2010   CL 106 12/27/2010   CREATININE 0.9 12/27/2010   BUN 17 12/27/2010   CO2 25 12/27/2010   TSH 2.21 12/27/2010   PSA 1.07 12/27/2010   INR 1.2 ratio* 01/21/2009   HGBA1C 7.4* 12/27/2010          Objective:   Physical Exam Vital signs reviewed Gen'l: Well nourished well developed slender white male in no acute distress  HENT:  Head: Normocephalic and atraumatic.  Right Ear: External ear normal. EAC with cerumen that irrigated easily/TM nl Left Ear: External ear normal.  EAC/TM nl Nose: Nose normal.  Mouth/Throat: Oropharynx is clear and moist. Dentition - native, in good repair. No buccal or palatal lesions. Posterior pharynx clear. Eyes: Conjunctivae and sclera clear. EOM intact. Pupils are equal, round, and reactive to light. Right eye exhibits no discharge. Left eye exhibits no discharge. Neck: Normal range of motion. Neck supple. No JVD present. No tracheal deviation present. No thyromegaly present.  Cardiovascular: Normal rate, regular rhythm, no gallop, no friction rub, no murmur heard.      Quiet precordium. 2+ radial and DP pulses . No carotid bruits Pulmonary/Chest: Effort normal. No respiratory distress or  increased WOB, no wheezes, no rales. No chest wall deformity or CVAT. Abdominal: Soft. Bowel sounds are normal in all quadrants. He exhibits no distension, no tenderness, no rebound or guarding, No heptosplenomegaly  Genitourinary:  deferred to normal PSA Musculoskeletal: Normal range of motion. He exhibits no edema and no tenderness.       Small and large joints without redness, synovial thickening or deformity. Full range of motion preserved about all small, median and large joints.  Lymphadenopathy:    He has no cervical or supraclavicular adenopathy.  Neurological: He is alert and oriented to person, place, and time. CN II-XII intact. DTRs 2+ and symmetrical biceps, radial and patellar tendons. Cerebellar function normal with no tremor, rigidity, normal gait and station.  Skin: Skin is warm and dry. No rash noted. No erythema.  Psychiatric: He has a normal mood and affect. His behavior is normal. Thought content normal.         Assessment & Plan:

## 2010-12-28 NOTE — Progress Notes (Signed)
HPI:  This is a 70 year old gentleman presented for followup evaluation. The patient has been followed by Dr. Juanda Chance for several years and I will be assuming his cardiac care in Dr Regino Schultze retirement.  He has been an avid runner and developed mild chest tightness with exertion in 2010. He had a Myoview stress scan demonstrated ischemia and underwent cardiac catheterization and PCI of the LAD and diagonal branches with drug-eluting stent platforms at that time.  He has had no recurrent angina since his PCI procedure. He specifically denies chest pain or tightness with exertion. He runs about 18 miles every week. He denies lightheadedness, palpitations, leg swelling, or dyspnea.  Outpatient Encounter Prescriptions as of 12/28/2010  Medication Sig Dispense Refill  . acetaminophen (TYLENOL) 325 MG tablet Take 650 mg by mouth daily.        Marland Kitchen aspirin 81 MG tablet Take 1 tablet (81 mg total) by mouth daily.  30 tablet  0  . clopidogrel (PLAVIX) 75 MG tablet Take 75 mg by mouth daily.        Marland Kitchen glimepiride (AMARYL) 2 MG tablet Take 2 mg by mouth daily before breakfast.        . rosuvastatin (CRESTOR) 20 MG tablet Take 20 mg by mouth daily.        . sildenafil (VIAGRA) 100 MG tablet Take 100 mg by mouth daily as needed. For lower back pain       . zolpidem (AMBIEN) 10 MG tablet take 1 tablet by mouth at bedtime if needed  90 tablet  1  . DISCONTD: aspirin 325 MG tablet Take 325 mg by mouth daily.        . nitroGLYCERIN (NITROSTAT) 0.4 MG SL tablet Place 0.4 mg under the tongue every 5 (five) minutes as needed. As needed for chest pain- may repeat times 3 as needed       . DISCONTD: glimepiride (AMARYL) 2 MG tablet Take 1 tablet (2 mg total) by mouth every morning.  90 tablet  3  . DISCONTD: pioglitazone (ACTOS) 15 MG tablet Take 15 mg by mouth daily.          No Known Allergies  Past Medical History  Diagnosis Date  . Hypertension   . Diabetes mellitus   . Hyperlipidemia   . Coronary artery disease       s//p DES LAD and Dx1 01/2009  . Multiple thyroid nodules     ROS: Negative except as per HPI  BP 125/81  Pulse 75  Ht 6' (1.829 m)  Wt 172 lb 12.8 oz (78.382 kg)  BMI 23.44 kg/m2  PHYSICAL EXAM: Pt is alert and oriented, physically fit male in NAD HEENT: normal Neck: JVP - normal, carotids 2+= without bruits Lungs: CTA bilaterally CV: RRR without murmur or gallop Abd: soft, NT, Positive BS, no hepatomegaly Ext: no C/C/E, distal pulses intact and equal Skin: warm/dry no rash  EKG:  Normal sinus rhythm 74 beats per minute, within normal limits.  ASSESSMENT AND PLAN:

## 2010-12-28 NOTE — Assessment & Plan Note (Signed)
Stable. Risk modification as noted above. To see Dr. Excell Seltzer today.

## 2010-12-29 ENCOUNTER — Telehealth: Payer: Self-pay | Admitting: *Deleted

## 2010-12-29 MED ORDER — PIOGLITAZONE HCL 15 MG PO TABS: 15.0000 mg | ORAL_TABLET | Freq: Every day | ORAL | Status: DC

## 2010-12-29 NOTE — Telephone Encounter (Signed)
Right - neither of [place] remembered. He told Dr. Charlies Constable he stopped the [facility] but I don't know what kind of reaction he had.  I recommend he try the actos 15mg  once a day. There is no valid concern about this drug causing bladder cancer, as we discussed in the office.

## 2010-12-29 NOTE — Telephone Encounter (Signed)
Pt walked into the office today and states he has been on Amaryl 2 mg in the past with an intolerance to this medication . I see in phone note from 06/22/2010 he was switched from amaryl 2mg  to Actos 15 mg. Pt would like to know what he should do and what another possible alternative should be. Please Advise

## 2010-12-29 NOTE — Telephone Encounter (Signed)
Patient informed - he already has rx for actos, med list updated

## 2011-02-01 ENCOUNTER — Other Ambulatory Visit: Payer: Self-pay | Admitting: Dermatology

## 2011-02-02 ENCOUNTER — Encounter: Payer: Self-pay | Admitting: Gastroenterology

## 2011-03-03 ENCOUNTER — Ambulatory Visit (INDEPENDENT_AMBULATORY_CARE_PROVIDER_SITE_OTHER): Payer: BC Managed Care – PPO | Admitting: Cardiovascular Disease

## 2011-03-03 DIAGNOSIS — I2510 Atherosclerotic heart disease of native coronary artery without angina pectoris: Secondary | ICD-10-CM

## 2011-03-03 NOTE — Progress Notes (Signed)
Exercise Treadmill Test  Pre-Exercise Testing Evaluation Rhythm: normal sinus  Rate: 70   PR:  .17 QRS:  .09  QT:  .40 QTc: .43     Test  Exercise Tolerance Test Ordering MD: Tonny Bollman, MD  Interpreting MD:  Tonny Bollman, MD  Unique Test No: 1  Treadmill:  1  Indication for ETT: known ASHD  Contraindication to ETT: No   Stress Modality: exercise - treadmill  Cardiac Imaging Performed: non   Protocol: standard Bruce - maximal  Max BP:  181/64  Max MPHR (bpm):  152 85% MPR (bpm):  129  MPHR obtained (bpm)130 % MPHR obtained 85  Reached 85% MPHR (min:sec):  13:26 Total Exercise Time (min-sec): 15:00  Workload in METS:  17.2 Borg Scale: 14  Reason ETT Terminated:  desired heart rate attained    ST Segment Analysis At Rest: normal ST segments - no evidence of significant ST depression With Exercise: no evidence of significant ST depression  Other Information Arrhythmia:  No Angina during ETT:  absent (0) Quality of ETT:  diagnostic  ETT Interpretation:  normal - no evidence of ischemia by ST analysis  Comments: Excellent exercise tolerance. No angina, significant arrhythmia, or ST change with exertion.  Recommendations: Continue current medical and exercise program.

## 2011-03-18 ENCOUNTER — Other Ambulatory Visit: Payer: BC Managed Care – PPO | Admitting: Gastroenterology

## 2011-03-25 ENCOUNTER — Ambulatory Visit (INDEPENDENT_AMBULATORY_CARE_PROVIDER_SITE_OTHER): Payer: BC Managed Care – PPO | Admitting: Internal Medicine

## 2011-03-25 ENCOUNTER — Encounter: Payer: Self-pay | Admitting: *Deleted

## 2011-03-25 VITALS — BP 118/82 | HR 64 | Ht 72.0 in | Wt 173.0 lb

## 2011-03-25 DIAGNOSIS — Z79899 Other long term (current) drug therapy: Secondary | ICD-10-CM

## 2011-03-25 DIAGNOSIS — Z86010 Personal history of colonic polyps: Secondary | ICD-10-CM

## 2011-03-25 MED ORDER — PEG-KCL-NACL-NASULF-NA ASC-C 100 G PO SOLR: 1.0000 | Freq: Once | ORAL | Status: DC

## 2011-03-25 NOTE — Patient Instructions (Signed)
You have been scheduled for a Colonoscopy. See separate instructions.  Pick up your prep kit from the pharmacy.  You will be contacted by our office prior to your procedure for directions on holding your Plavix.  If you do not hear from our office 1 week prior to your scheduled procedure, please call [phone] to discuss.  cc: Illene Regulus, MD      Tonny Bollman, MD

## 2011-03-25 NOTE — Progress Notes (Signed)
Subjective:    Patient ID: Alex Meyers, male    DOB: 01-13-1941, 70 y.o.   MRN: [id number]  HPI Alex Meyers is a 70 year old male with a past medical history of CAD (status post PCI July 2010), hypertension, hyperlipidemia, diabetes, and colon polyps who seen in consultation at the request of Dr. Debby Bud to consider repeat colonoscopy.  The patient's last colonoscopy was in favor 2004 which time he had polyps removed, and on review of pathology results these polyps are adenomatous. I do not have the colonoscopy report at present and therefore I do not know how many polyps removed at that time. I have requested a copy of his colonoscopy report from Dr. Clerance Lav office.    Today the patient reports he is doing well. He is having no concerning GI symptoms. No abdominal pain, nausea, vomiting. He denies weight loss and reports his appetite is good. He's had no change in his bowel habits, including no rectal bleeding, hematochezia or melena.   Review of Systems Constitutional: Negative for fever, chills, night sweats, activity change, appetite change and unexpected weight change HEENT: Negative for sore throat, mouth sores and trouble swallowing. Eyes: Negative for visual disturbance Respiratory: Negative for cough, chest tightness and shortness of breath Cardiovascular: Negative for chest pain, palpitations and lower extremity swelling Gastrointestinal: See history of present illness Genitourinary: Negative for dysuria and hematuria. Musculoskeletal: Negative for back pain, arthralgias and myalgias Skin: Negative for rash or color change Neurological: Negative for headaches, weakness, numbness Hematological: Negative for adenopathy, negative for easy bruising/bleeding Psychiatric/behavioral: Negative for depressed mood, negative for anxiety  Past Medical History  Diagnosis Date  . Hypertension   . Diabetes mellitus   . Hyperlipidemia   . Coronary artery disease     s//p DES LAD and Dx1  01/2009  . Multiple thyroid nodules   . Personal history of colonic polyps 08/09/2002    ADENOMATOUS POLYP   Current Outpatient Prescriptions  Medication Sig Dispense Refill  . aspirin 81 MG tablet Take 1 tablet (81 mg total) by mouth daily.  30 tablet  0  . clopidogrel (PLAVIX) 75 MG tablet Take 75 mg by mouth daily.        . nitroGLYCERIN (NITROSTAT) 0.4 MG SL tablet Place 0.4 mg under the tongue every 5 (five) minutes as needed. As needed for chest pain- may repeat times 3 as needed       . pioglitazone (ACTOS) 15 MG tablet Take 1 tablet (15 mg total) by mouth daily.  30 tablet  2  . rosuvastatin (CRESTOR) 20 MG tablet Take 20 mg by mouth daily.        . sildenafil (VIAGRA) 100 MG tablet Take 100 mg by mouth daily as needed. For lower back pain       . zolpidem (AMBIEN) 10 MG tablet take 1 tablet by mouth at bedtime if needed  90 tablet  1  . peg 3350 powder (MOVIPREP) 100 G SOLR Take 1 kit (100 g total) by mouth once.  1 kit  0   No Known Allergies Family History  Problem Relation Age of Onset  . Heart failure Mother   . Coronary artery disease Mother   . Cancer Father 40    bladder     Social History  . Marital Status: Divorced    Number of Children: 3   Occupational History  . LAWYER    Social History Main Topics  . Smoking status: Never Smoker   . Smokeless tobacco: Never  Used  . Alcohol Use: Yes     1/2 drinks per day  . Drug Use: No   Social History Narrative   Nelda Severe, Law school - [employer] Married '78 the patient is divorced after 22 years of marriage. 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 Serially monogamous, does not always practice safe sex.        Objective:   Physical Exam BP 118/82  Pulse 64  Ht 6' (1.829 m)  Wt 173 lb (78.472 kg)  BMI 23.46 kg/m2 Constitutional: Well-developed and well-nourished. No distress. HEENT: Normocephalic and atraumatic. Oropharynx is clear and moist. No oropharyngeal exudate. Conjunctivae are normal. Pupils are equal  round and reactive to light. No scleral icterus. Neck: Neck supple. Trachea midline. Cardiovascular: Normal rate, regular rhythm and intact distal pulses. No M/R/G Pulmonary/chest: Effort normal and breath sounds normal. No wheezing, rales or rhonchi. Abdominal: Soft, nontender, nondistended. Bowel sounds active throughout. There are no masses palpable. No hepatosplenomegaly. Lymphadenopathy: No cervical adenopathy noted. Neurological: Alert and oriented to person place and time. Skin: Skin is warm and dry. No rashes noted. Psychiatric: Normal mood and affect. Behavior is normal.  Pathology report: Patient Name: Alex Meyers, Alex Meyers PID: [id number] Pathologist: Lyn Hollingshead. Delila Spence, MD DOB/Age 22-Jul-1940 (Age: 89) Gender: M Date Taken: 08/09/2002 Date Received: 08/09/2002  FINAL DIAGNOSIS  MICROSCOPIC EXAMINATION AND DIAGNOSIS  COLON: ADENOMATOUS POLYP(S). NO HIGH GRADE DYSPLASIA OR INVASIVE MALIGNANCY IDENTIFIED. (TWO)  specimen(s) obtained Colon, polyp(s), assorted  Gross Description Received in formalin are tan, soft tissue fragments that are submitted in toto. Number: 2 Size: 0.3 and 0.4 cm, toto one cassette. (JBM:gt) 08/12/02    Assessment & Plan:  70 year old male with a past medical history of CAD (status post PCI July 2010), hypertension, hyperlipidemia, diabetes, and adenomatous colon polyps who seen in consultation at the request of Dr. Debby Bud to consider repeat colonoscopy.  1. H/o adenomatous colon polyps -- the patient has a history of adenomatous colon polyps removed in 2004. At this point is unclear to me how may polyps were removed but at a maximum for screening interval would of been 5 years. Therefore he is due for colonoscopy now. He is on daily Plavix but it has been greater than 12 months since his last PCI, therefore it is likely okay to hold this medication preprocedure.  I would like to confirm this with his cardiologist Dr. Excell Seltzer.  If Dr. Excell Seltzer is okay holding Plavix, we  will hold Plavix 5 days prior to his colonoscopy. We discussed the test today and he agrees to proceed.

## 2011-03-29 ENCOUNTER — Telehealth: Payer: Self-pay

## 2011-03-29 NOTE — Telephone Encounter (Signed)
Left a message informing patient that he can come off Plavix 5 days prior to his procedure per Dr. Excell Seltzer.

## 2011-04-04 ENCOUNTER — Telehealth: Payer: Self-pay | Admitting: Gastroenterology

## 2011-04-04 NOTE — Telephone Encounter (Signed)
Forwarded to Dr. Russella Dar for review.

## 2011-04-10 ENCOUNTER — Other Ambulatory Visit: Payer: Self-pay | Admitting: Internal Medicine

## 2011-04-12 NOTE — Telephone Encounter (Signed)
ok 

## 2011-04-22 ENCOUNTER — Other Ambulatory Visit: Payer: Self-pay | Admitting: *Deleted

## 2011-04-22 MED ORDER — ROSUVASTATIN CALCIUM 20 MG PO TABS: 20.0000 mg | ORAL_TABLET | Freq: Every day | ORAL | Status: AC

## 2011-04-26 ENCOUNTER — Other Ambulatory Visit: Payer: Self-pay | Admitting: *Deleted

## 2011-04-26 MED ORDER — CLOPIDOGREL BISULFATE 75 MG PO TABS: 75.0000 mg | ORAL_TABLET | Freq: Every day | ORAL | Status: DC

## 2011-04-27 ENCOUNTER — Other Ambulatory Visit: Payer: BC Managed Care – PPO | Admitting: Internal Medicine

## 2011-05-17 ENCOUNTER — Telehealth: Payer: Self-pay | Admitting: Internal Medicine

## 2011-05-17 NOTE — Telephone Encounter (Signed)
Phoned pt again.  Pt explained that he had eaten this morning and is scheduled for colon tomorrow afternoon.  Told him to go ahead and proceed with instructions.  Following clear liquid diet today, drinking plenty of fluids and starting prep at 500 pm.  Pt verbalizes understanding.

## 2011-05-17 NOTE — Telephone Encounter (Signed)
Returned pts phone call.  No answer.  Message left on voicemail.

## 2011-05-18 ENCOUNTER — Ambulatory Visit (AMBULATORY_SURGERY_CENTER): Payer: Medicare Other | Admitting: Internal Medicine

## 2011-05-18 ENCOUNTER — Encounter: Payer: Self-pay | Admitting: Internal Medicine

## 2011-05-18 VITALS — BP 143/79 | HR 60 | Temp 96.8°F | Resp 20 | Ht 72.0 in | Wt 173.0 lb

## 2011-05-18 DIAGNOSIS — D126 Benign neoplasm of colon, unspecified: Secondary | ICD-10-CM

## 2011-05-18 DIAGNOSIS — Z86010 Personal history of colonic polyps: Secondary | ICD-10-CM

## 2011-05-18 DIAGNOSIS — Z1211 Encounter for screening for malignant neoplasm of colon: Secondary | ICD-10-CM

## 2011-05-18 DIAGNOSIS — D1330 Benign neoplasm of unspecified part of small intestine: Secondary | ICD-10-CM

## 2011-05-18 LAB — GLUCOSE, CAPILLARY
Glucose-Capillary: 107 mg/dL — ABNORMAL HIGH (ref 70–99)
Glucose-Capillary: 110 mg/dL — ABNORMAL HIGH (ref 70–99)

## 2011-05-18 MED ORDER — SODIUM CHLORIDE 0.9 % IV SOLN: 500.0000 mL | INTRAVENOUS | Status: DC

## 2011-05-18 NOTE — Patient Instructions (Signed)
Please refer to your blue and neon green sheets for instructions regarding diet and activity for the rest of today.  Restart your Plavix tomorrow (05/19/2011).You may resume all other medications as you would normally take them.   You will receive a letter in the mail in about two weeks regarding the results of the biopsies taken today.  Diverticulosis Diverticulosis is a common condition that develops when small pouches (diverticula) form in the wall of the colon. The risk of diverticulosis increases with age. It happens more often in people who eat a low-fiber diet. Most individuals with diverticulosis have no symptoms. Those individuals with symptoms usually experience abdominal pain, constipation, or loose stools (diarrhea). HOME CARE INSTRUCTIONS   Increase the amount of fiber in your diet as directed by your caregiver or dietician. This may reduce symptoms of diverticulosis.   Your caregiver may recommend taking a dietary fiber supplement.   Drink at least 6 to 8 glasses of water each day to prevent constipation.   Try not to strain when you have a bowel movement.   Your caregiver may recommend avoiding nuts and seeds to prevent complications, although this is still an uncertain benefit.   Only take over-the-counter or prescription medicines for pain, discomfort, or fever as directed by your caregiver.  FOODS WITH HIGH FIBER CONTENT INCLUDE:  Fruits. Apple, peach, pear, tangerine, raisins, prunes.   Vegetables. Brussels sprouts, asparagus, broccoli, cabbage, carrot, cauliflower, romaine lettuce, spinach, summer squash, tomato, winter squash, zucchini.   Starchy Vegetables. Baked beans, kidney beans, lima beans, split peas, lentils, potatoes (with skin).   Grains. Whole wheat bread, brown rice, bran flake cereal, plain oatmeal, white rice, shredded wheat, bran muffins.  SEEK IMMEDIATE MEDICAL CARE IF:   You develop increasing pain or severe bloating.   You have an oral  temperature above 102 F (38.9 C), not controlled by medicine.   You develop vomiting or bowel movements that are bloody or black.  Document Released: 03/17/2004 Document Revised: 03/02/2011 Document Reviewed: 11/18/2009 [facility] Patient Information 2012 [place], [place].  Hemorrhoids Hemorrhoids are veins in the rectum that get big. These veins can get blocked. Blocked veins become puffy (swollen) and painful. HOME CARE  Eat more fiber.   Drink enough fluid to keep your pee (urine) clear or pale yellow.   Exercise often.   Avoid straining to poop (bowel movement).   Keep the butt area dry and clean.   Only take medicine as told by your doctor.  If your hemorrhoids are puffy and painful:  Take a warm bath for 20 to 30 minutes. Do this 3 to 4 times a day.   Place ice packs on the area. Use the ice packs between the baths.   Put ice in a plastic bag.   Place a towel between your skin and the bag.   Leave the ice on for 15 to 20 minutes, 3 to 4 times a day.   Do not use a donut-shaped pillow. Do not sit on the toilet for a long time.   Go to the bathroom when your body has the urge to poop. This is so you do not strain as much to poop.  GET HELP RIGHT AWAY IF:   You have increasing pain that is not controlled with medicine.   You have uncontrolled bleeding.   You cannot poop.   You have pain or puffiness outside the area of the hemorrhoids.   You have chills.   You have a temperature by mouth above  102 F (38.9 C), not controlled by medicine.  MAKE SURE YOU:   Understand these instructions.   Will watch your condition.   Will get help right away if you are not doing well or get worse.  Document Released: 03/29/2008 Document Revised: 03/02/2011 Document Reviewed: 03/29/2008 [facility] Patient Information 2012 [place], [place].  Colon Polyps A polyp is extra tissue that grows inside your body. Colon polyps grow in the large intestine. The large intestine, also  called the colon, is part of your digestive system. It is a long, hollow tube at the end of your digestive tract where your body makes and stores stool. Most polyps are not dangerous. They are benign. This means they are not cancerous. But over time, some types of polyps can turn into cancer. Polyps that are smaller than a pea are usually not harmful. But larger polyps could someday become or may already be cancerous. To be safe, doctors remove all polyps and test them.  WHO GETS POLYPS? Anyone can get polyps, but certain people are more likely than others. You may have a greater chance of getting polyps if:  You are over 50.   You have had polyps before.   Someone in your family has had polyps.   Someone in your family has had cancer of the large intestine.   Find out if someone in your family has had polyps. You may also be more likely to get polyps if you:   Eat a lot of fatty foods.   Smoke.   Drink alcohol.   Do not exercise.   Eat too much.  SYMPTOMS  Most small polyps do not cause symptoms. People often do not know they have one until their caregiver finds it during a regular checkup or while testing them for something else. Some people do have symptoms like these:  Bleeding from the anus. You might notice blood on your underwear or on toilet paper after you have had a bowel movement.   Constipation or diarrhea that lasts more than a week.   Blood in the stool. Blood can make stool look black or it can show up as red streaks in the stool.  If you have any of these symptoms, see your caregiver. HOW DOES THE DOCTOR TEST FOR POLYPS? The doctor can use four tests to check for polyps:  Digital rectal exam. The caregiver wears gloves and checks your rectum (the last part of the large intestine) to see if it feels normal. This test would find polyps only in the rectum. Your caregiver may need to do one of the other tests listed below to find polyps higher up in the intestine.    Barium enema. The caregiver puts a liquid called barium into your rectum before taking x-rays of your large intestine. Barium makes your intestine look white in the pictures. Polyps are dark, so they are easy to see.   Sigmoidoscopy. With this test, the caregiver can see inside your large intestine. A thin flexible tube is placed into your rectum. The device is called a sigmoidoscope, which has a light and a tiny video camera in it. The caregiver uses the sigmoidoscope to look at the last third of your large intestine.   Colonoscopy. This test is like sigmoidoscopy, but the caregiver looks at all of the large intestine. It usually requires sedation. This is the most common method for finding and removing polyps.  TREATMENT   The caregiver will remove the polyp during sigmoidoscopy or colonoscopy. The polyp is  then tested for cancer.   If you have had polyps, your caregiver may want you to get tested regularly in the future.  PREVENTION  There is not one sure way to prevent polyps. You might be able to lower your risk of getting them if you:  Eat more fruits and vegetables and less fatty food.   Do not smoke.   Avoid alcohol.   Exercise every day.   Lose weight if you are overweight.   Eating more calcium and folate can also lower your risk of getting polyps. Some foods that are rich in calcium are milk, cheese, and broccoli. Some foods that are rich in folate are chickpeas, kidney beans, and spinach.   Aspirin might help prevent polyps. Studies are under way.  Document Released: 03/16/2004 Document Revised: 03/02/2011 Document Reviewed: 08/22/2007 [facility] Patient Information 2012 [place], [place].

## 2011-05-19 ENCOUNTER — Telehealth: Payer: Self-pay | Admitting: *Deleted

## 2011-05-19 NOTE — Telephone Encounter (Signed)
Message left for [place] to call if needed.

## 2011-05-31 ENCOUNTER — Other Ambulatory Visit (INDEPENDENT_AMBULATORY_CARE_PROVIDER_SITE_OTHER): Payer: Medicare Other

## 2011-05-31 ENCOUNTER — Telehealth: Payer: Self-pay | Admitting: *Deleted

## 2011-05-31 DIAGNOSIS — E119 Type 2 diabetes mellitus without complications: Secondary | ICD-10-CM

## 2011-05-31 LAB — HEMOGLOBIN A1C: Hgb A1c MFr Bld: 7.4 % — ABNORMAL HIGH (ref 4.6–6.5)

## 2011-05-31 NOTE — Telephone Encounter (Signed)
Spoke with pt, he is not happy because he has not received his colonoscopy results. He said he wants to hear his results from Dr Debby Bud. Pt would also like an A1C done before he comes in for appointment tomorrow. Can I order A1C?

## 2011-05-31 NOTE — Telephone Encounter (Signed)
OK for an A1C 250.00  Colonoscopy reports usually come to patient from GI!  He had 8 polyps/fragments removed: 5 were tubular adenomas, 2 lymphoid polyps.   For more information and interval for next study he should check with Dr. Rhea Belton who did his study.

## 2011-05-31 NOTE — Telephone Encounter (Signed)
I am unaware of any blood test used to screen for HPV. Could not find such a test in reviewing UpToDate-STD screening. For men who have sex with men cytology from rectal tissue may be done. The prevalence of HPV is very high and treatment of older adults re: prevention has no recommendations. Woman are screened at the time they have PAP smears. If he has additional information I am open to it.

## 2011-05-31 NOTE — Telephone Encounter (Signed)
Pt also wants a blood test to check for HPV????? Did not want to discuss why. What do you advise for this.

## 2011-06-01 ENCOUNTER — Encounter: Payer: Self-pay | Admitting: Internal Medicine

## 2011-06-01 ENCOUNTER — Ambulatory Visit (INDEPENDENT_AMBULATORY_CARE_PROVIDER_SITE_OTHER): Payer: Medicare Other | Admitting: Internal Medicine

## 2011-06-01 VITALS — BP 122/70 | HR 60 | Temp 98.0°F | Ht 72.0 in | Wt 172.0 lb

## 2011-06-01 DIAGNOSIS — Z86010 Personal history of colonic polyps: Secondary | ICD-10-CM

## 2011-06-01 DIAGNOSIS — N400 Enlarged prostate without lower urinary tract symptoms: Secondary | ICD-10-CM | POA: Insufficient documentation

## 2011-06-01 DIAGNOSIS — E119 Type 2 diabetes mellitus without complications: Secondary | ICD-10-CM

## 2011-06-01 DIAGNOSIS — Z113 Encounter for screening for infections with a predominantly sexual mode of transmission: Secondary | ICD-10-CM

## 2011-06-01 DIAGNOSIS — I10 Essential (primary) hypertension: Secondary | ICD-10-CM

## 2011-06-01 NOTE — Progress Notes (Signed)
  Subjective:    Patient ID: Alex Meyers, male    DOB: [birth year], 70 y.o.   MRN: [id number]  HPI Alex Meyers presents today with many questions about HPV. His current SO is facing hysterectomy for cervical dysplasia. She may have had prior diagnosis of HPV but she is evidently concerned about possibly having been reinfected. Alex Meyers has no h/o HPV or genital warts. He is interested in having any potential blood test or screening.  DM - A1C 7.4% on Actos. Intolerant of metformin and amaryl. Trial of adding onglyza 5mg .   Past Medical History  Diagnosis Date  . Diabetes mellitus   . Coronary artery disease     s//p DES LAD and Dx1 01/2009  . Multiple thyroid nodules   . Personal history of colonic polyps 08/09/2002    ADENOMATOUS POLYP  . Hyperlipidemia 05-18-11    pt denies  . Hypertension 05-18-11    pt. denies   Past Surgical History  Procedure Date  . Colonoscopy 2004    per Dr. Melchor Amour, several polyps  . Coronary stent placement 01/22/2009    right side  . Wrist surgery     right side   Family History  Problem Relation Age of Onset  . Heart failure Mother   . Coronary artery disease Mother   . Cancer Father 5    bladder    History   Social History  . Marital Status: Divorced    Spouse Name: N/A    Number of Children: 3  . Years of Education: N/A   Occupational History  . LAWYER    Social History Main Topics  . Smoking status: Never Smoker   . Smokeless tobacco: Never Used  . Alcohol Use: Yes     1/2 drinks per day  . Drug Use: No  . Sexually Active: Not on file   Other Topics Concern  . Not on file   Social History Narrative   Alex Meyers, Law school - [employer] . Married '78 the patient is divorced after 22 years of marriage. Has 3 daughters. He is an active attorney, formerly with [employer] but he has recently ('12) gone out on his own. Serially monogamous, does not always practice safe sex.        Review of Systems System review is  negative for any constitutional, cardiac, pulmonary, GI or neuro symptoms or complaints other than as described in the HPI.     Objective:   Physical Exam Vitals stable Gen'l- WNWD white man HEENT C&S clear Pulm- normal respirations Cor- RRR       Assessment & Plan:  STD- Patient given several chpts from UpToDate on STD screening, virology of HPV, Epidemiology of HPV, HPV treatment. He is also directed to CompDrinks.no and PubMed.gov for additional information.   (greater than 50% of 30 min visit spent on education and counseling)

## 2011-06-01 NOTE — Telephone Encounter (Signed)
Pt here for appointment today.

## 2011-06-04 ENCOUNTER — Encounter: Payer: Self-pay | Admitting: Internal Medicine

## 2011-06-04 NOTE — Assessment & Plan Note (Signed)
Lab Results  Component Value Date   HGBA1C 7.4* 05/31/2011   Discussed this with him. He is interested in aggressive therapy to bring his A!C down since he has known CAD.  Plan - continue Actos,           Add Onglyza 5 mg           Repeat A1C in 3 months (order entered)

## 2011-06-04 NOTE — Assessment & Plan Note (Signed)
Patient with recent colonoscopy with polypectomy. He had multipel tubulo-villous adenomatous polyps  Plan - recall interval per Dr. Rhea Belton

## 2011-06-04 NOTE — Assessment & Plan Note (Signed)
BP Readings from Last 3 Encounters:  06/01/11 122/70  05/18/11 143/79  03/25/11 118/82   Good control

## 2011-06-20 ENCOUNTER — Other Ambulatory Visit: Payer: Self-pay | Admitting: *Deleted

## 2011-06-20 MED ORDER — SAXAGLIPTIN HCL 5 MG PO TABS: 5.0000 mg | ORAL_TABLET | Freq: Every day | ORAL | Status: DC

## 2011-06-22 ENCOUNTER — Other Ambulatory Visit: Payer: Self-pay | Admitting: Internal Medicine

## 2011-07-14 ENCOUNTER — Telehealth: Payer: Self-pay | Admitting: *Deleted

## 2011-07-14 MED ORDER — SILDENAFIL CITRATE 100 MG PO TABS: ORAL_TABLET | ORAL | Status: DC

## 2011-07-14 MED ORDER — SILDENAFIL CITRATE 100 MG PO TABS: 100.0000 mg | ORAL_TABLET | Freq: Every day | ORAL | Status: DC | PRN

## 2011-07-14 NOTE — Telephone Encounter (Signed)
Done

## 2011-07-14 NOTE — Telephone Encounter (Signed)
Sig for Viagra says for "Lower back pain." is this correct?

## 2011-07-14 NOTE — Telephone Encounter (Signed)
No! For ED

## 2011-07-14 NOTE — Telephone Encounter (Signed)
Refill request for Viagra 100mg .

## 2011-07-14 NOTE — Telephone Encounter (Deleted)
Sig for Viagra says for "Lower back pain." Is this correct?

## 2011-07-18 ENCOUNTER — Ambulatory Visit (INDEPENDENT_AMBULATORY_CARE_PROVIDER_SITE_OTHER): Payer: Medicare Other | Admitting: Sports Medicine

## 2011-07-18 ENCOUNTER — Ambulatory Visit: Payer: Medicare Other | Admitting: Sports Medicine

## 2011-07-18 VITALS — BP 114/68

## 2011-07-18 DIAGNOSIS — M79609 Pain in unspecified limb: Secondary | ICD-10-CM

## 2011-07-18 DIAGNOSIS — M79669 Pain in unspecified lower leg: Secondary | ICD-10-CM | POA: Insufficient documentation

## 2011-07-18 DIAGNOSIS — M79605 Pain in left leg: Secondary | ICD-10-CM

## 2011-07-18 DIAGNOSIS — M25539 Pain in unspecified wrist: Secondary | ICD-10-CM

## 2011-07-18 NOTE — Patient Instructions (Signed)
1. Take 2 aleve twice daily.  2.  Wear you compression sleeve with running.  3. Do your exercises 3 sets of 15 reps twice day.  4. Ice the area for 10-15 minutes after running.  5. Follow up in 3 weeks.

## 2011-07-19 DIAGNOSIS — M79605 Pain in left leg: Secondary | ICD-10-CM | POA: Insufficient documentation

## 2011-07-19 NOTE — Assessment & Plan Note (Addendum)
Discussed options for treatment with this patient.  He would like to be conservative.  We will start with using a compression sleeve and a HEP.  He will also get a new pair of shoes because his old running shoes are over warn on the outside heel, c/w over supination with running.  If his pain continues we will consider added heel support.  He will take aleve daily until his follow up.

## 2011-07-19 NOTE — Progress Notes (Signed)
  Subjective:    Patient ID: Alex Meyers, male    DOB: 07-Nov-1940, 71 y.o.   MRN: [id number]  HPI 71 y/o male is here complaining of left lower leg pain that started 5 days ago while he was walking.  He his a runner who runs about 18 miles a week.  The day before the pain started he had run 6 miles pain free.  He has run since and has the pain only at the beginning of the run.  He hasn't had a problem like this before.  The pain resolves spontaneously.  Running is the only aggravating factor.  He notes that his shoes are badly worn on the outside edges   Review of Systems     Objective:   Physical Exam  There is a 1cm circular area of erythema and swelling on the left mid-lateral calf This area is tender to palpation over the peroneals He can hop over 10 times without pain There is normal range of motion at the foot and ankle Eversion of the foot against resistance reproduces his pain  Ultrasound: The tibia is intact without signs of inflammation.  In the area of pain the peroneals show edema in the muscle fibers.  The fibers appear to be intact.  There is abnormal doppler activity noted in the area. No abnormality noted over the fibula with scanning.       Assessment & Plan:

## 2011-07-21 ENCOUNTER — Other Ambulatory Visit: Payer: Self-pay | Admitting: Dermatology

## 2011-08-10 ENCOUNTER — Ambulatory Visit: Payer: Medicare Other | Admitting: Sports Medicine

## 2011-08-19 ENCOUNTER — Telehealth: Payer: Self-pay | Admitting: Internal Medicine

## 2011-08-19 DIAGNOSIS — Z202 Contact with and (suspected) exposure to infections with a predominantly sexual mode of transmission: Secondary | ICD-10-CM

## 2011-08-19 NOTE — Telephone Encounter (Signed)
The pt called is hoping to get a referral to [facility] for HPV.  He states he discussed this situation during his last appt.   Thanks!

## 2011-08-21 NOTE — Telephone Encounter (Signed)
Order placed with [facility] for referral to [facility] for STD exposure

## 2011-08-22 ENCOUNTER — Telehealth: Payer: Self-pay | Admitting: Cardiovascular Disease

## 2011-08-22 NOTE — Telephone Encounter (Signed)
Will forward to Dr Excell Seltzer for response and then I will notify patient.

## 2011-08-22 NOTE — Telephone Encounter (Signed)
Spoke with patient and advised him that he does not need to stick to these temperature restrictions anymore. We talked about being sensible and avoiding extremes of heat and cold, but I don't think he needs to strictly adhere to avoiding cold temperatures especially.

## 2011-08-22 NOTE — Telephone Encounter (Signed)
New Msg: Pt calling wanting to speak with MD regarding pt being told not to run in under 40 degree weather and/or in 85 degree weather. Pt stated now that he is two years out; he wants to know if Dr. Excell Seltzer will now allow to run in weather under 40 degrees. If so pt wants to know at what temperature pt can/cannot run. Please leave vm if no answer.   Please return pt call to discuss further.

## 2011-08-30 ENCOUNTER — Ambulatory Visit: Payer: Medicare Other | Admitting: Sports Medicine

## 2011-10-18 ENCOUNTER — Other Ambulatory Visit: Payer: Self-pay | Admitting: *Deleted

## 2011-10-18 MED ORDER — SAXAGLIPTIN HCL 5 MG PO TABS: 5.0000 mg | ORAL_TABLET | Freq: Every day | ORAL | Status: DC

## 2011-10-18 NOTE — Telephone Encounter (Signed)
done

## 2011-10-19 ENCOUNTER — Other Ambulatory Visit: Payer: Self-pay

## 2011-11-09 ENCOUNTER — Ambulatory Visit (INDEPENDENT_AMBULATORY_CARE_PROVIDER_SITE_OTHER): Payer: Medicare Other | Admitting: Internal Medicine

## 2011-11-09 ENCOUNTER — Other Ambulatory Visit (INDEPENDENT_AMBULATORY_CARE_PROVIDER_SITE_OTHER): Payer: Medicare Other

## 2011-11-09 ENCOUNTER — Encounter: Payer: Self-pay | Admitting: Internal Medicine

## 2011-11-09 ENCOUNTER — Other Ambulatory Visit: Payer: Self-pay | Admitting: *Deleted

## 2011-11-09 VITALS — BP 122/78 | HR 55 | Temp 97.0°F | Resp 16 | Wt 176.0 lb

## 2011-11-09 DIAGNOSIS — IMO0001 Reserved for inherently not codable concepts without codable children: Secondary | ICD-10-CM

## 2011-11-09 DIAGNOSIS — E119 Type 2 diabetes mellitus without complications: Secondary | ICD-10-CM

## 2011-11-09 DIAGNOSIS — I2510 Atherosclerotic heart disease of native coronary artery without angina pectoris: Secondary | ICD-10-CM

## 2011-11-09 MED ORDER — PIOGLITAZONE HCL 30 MG PO TABS: 30.0000 mg | ORAL_TABLET | Freq: Every day | ORAL | Status: DC

## 2011-11-09 NOTE — Progress Notes (Signed)
Addended by: Illene Regulus E on: 11/09/2011 05:40 PM   Modules accepted: Orders, Medications, Level of Service

## 2011-11-09 NOTE — Progress Notes (Signed)
  Subjective:    Patient ID: Alex Meyers, male    DOB: 06-26-1941, 71 y.o.   MRN: [id number]  HPI Mr. Pennywell presents for follow up of his diabetes. He has been taking Actos 15 and Onglyza 5. Last A1C 7.4%. He exercises on a regular basis. He feels there is room to improve his diet.   Reviewed cardiac history. He had Nuc Stress in '10 - some hypokinesis and EF 51%. He is s/p PCI/stent. Last saw Dr. Excell Seltzer June '12 - he was doing fine but was to have a GXT  Which never happened. He has been free of chest pain.  Past Medical History  Diagnosis Date  . Diabetes mellitus   . Coronary artery disease     s//p DES LAD and Dx1 01/2009  . Multiple thyroid nodules   . Personal history of colonic polyps 08/09/2002    ADENOMATOUS POLYP  . Hyperlipidemia 05-18-11    pt denies  . Hypertension 05-18-11    pt. denies   Past Surgical History  Procedure Date  . Colonoscopy 2004    per Dr. Melchor Amour, several polyps  . Coronary stent placement 01/22/2009    right side  . Wrist surgery     right side   Family History  Problem Relation Age of Onset  . Heart failure Mother   . Coronary artery disease Mother   . Cancer Father 83    bladder    History   Social History  . Marital Status: Divorced    Spouse Name: N/A    Number of Children: 3  . Years of Education: N/A   Occupational History  . LAWYER    Social History Main Topics  . Smoking status: Never Smoker   . Smokeless tobacco: Never Used  . Alcohol Use: Yes     1/2 drinks per day  . Drug Use: No  . Sexually Active: Not on file   Other Topics Concern  . Not on file   Social History Narrative   Nelda Severe, Law school - [employer] . Married '78 the patient is divorced after 22 years of marriage. Has 3 daughters. He is an active attorney, formerly with [employer] but he has recently ('12) gone out on his own. Serially monogamous, does not always practice safe sex.        Review of Systems System review is negative for  any constitutional, cardiac, pulmonary, GI or neuro symptoms or complaints other than as described in the HPI.     Objective:   Physical Exam Filed Vitals:   11/09/11 1321  BP: 122/78  Pulse: 55  Temp: 97 F (36.1 C)  Resp: 16   Gen'l- Athletic white man in no distress Cor - RRR Pulm - normal respirations Neuro - A&O x 3       Assessment & Plan:

## 2011-11-09 NOTE — Progress Notes (Signed)
  Subjective:    Patient ID: Alex Meyers, male    DOB: 11/08/1940, 71 y.o.   MRN: [id number]  HPI  Walk in for lab only   Review of Systems     Objective:   Physical Exam        Assessment & Plan:

## 2011-11-09 NOTE — Patient Instructions (Signed)
Diabetes - A1C 7%, down from 7.4 % last two tests. Best test in '10 6.7% Plan  Do as good as you can do with the diet thing.  Continue onglyza 5 mg once a day  Increase Actos to daily - may take 2 x 15 mg until gone  Repeat A1C in 3 months  Cardiovascular - Dr. Excell Seltzer wanted you to have an exercise treadmill in July of '12. I want you to have a 2D echo to look at the ejection fraction = juice per squeeze.. Both tests will be pre-authorized and then scheduled. You will be notified.

## 2011-11-09 NOTE — Progress Notes (Addendum)
  Subjective:    Patient ID: Alex Meyers, male    DOB: 07/31/1940, 70 y.o.   MRN: [id number]  HPI    Review of Systems     Objective:   Physical Exam        Assessment & Plan:   

## 2011-11-09 NOTE — Progress Notes (Addendum)
  Subjective:    Patient ID: Alex Meyers, male    DOB: [birth year], 71 y.o.   MRN: [id number]  HPI    Review of Systems     Objective:   Physical Exam        Assessment & Plan:

## 2011-11-09 NOTE — Assessment & Plan Note (Signed)
Patient due for follow-up and to evaluate EF looking for recovery of EF  Plan GXT to be scheduled  2 D echo to evaluate EF and wall motion.  Annual follow-up with Dr. Excell Seltzer.

## 2011-11-09 NOTE — Assessment & Plan Note (Signed)
Lab Results  Component Value Date   HGBA1C 7.0* 11/09/2011   Improved control but could be better.  Plan  Dietary adherence   Increase Actos to 30 mg daily, continue onglyza 5 mg daily  A1C 3 months

## 2011-11-24 ENCOUNTER — Ambulatory Visit (HOSPITAL_COMMUNITY): Payer: Medicare Other | Attending: Cardiology

## 2011-11-24 ENCOUNTER — Other Ambulatory Visit (HOSPITAL_COMMUNITY): Payer: Self-pay | Admitting: Radiology

## 2011-11-24 ENCOUNTER — Other Ambulatory Visit: Payer: Self-pay

## 2011-11-24 DIAGNOSIS — I2510 Atherosclerotic heart disease of native coronary artery without angina pectoris: Secondary | ICD-10-CM

## 2011-11-24 DIAGNOSIS — E119 Type 2 diabetes mellitus without complications: Secondary | ICD-10-CM | POA: Insufficient documentation

## 2011-11-24 DIAGNOSIS — I77819 Aortic ectasia, unspecified site: Secondary | ICD-10-CM | POA: Insufficient documentation

## 2011-12-01 ENCOUNTER — Encounter: Payer: Self-pay | Admitting: Internal Medicine

## 2011-12-03 ENCOUNTER — Other Ambulatory Visit: Payer: Self-pay | Admitting: Cardiovascular Disease

## 2011-12-29 ENCOUNTER — Other Ambulatory Visit: Payer: Self-pay | Admitting: *Deleted

## 2011-12-29 ENCOUNTER — Other Ambulatory Visit: Payer: Self-pay | Admitting: Internal Medicine

## 2011-12-29 MED ORDER — ZOLPIDEM TARTRATE 10 MG PO TABS: 10.0000 mg | ORAL_TABLET | Freq: Every evening | ORAL | Status: DC | PRN

## 2011-12-29 NOTE — Telephone Encounter (Signed)
Patient reqeust refill on ambien,?

## 2011-12-29 NOTE — Telephone Encounter (Signed)
Refill on Zolpidem called to pharmacy. Patient notified

## 2012-01-25 ENCOUNTER — Telehealth: Payer: Self-pay | Admitting: Internal Medicine

## 2012-01-25 DIAGNOSIS — E119 Type 2 diabetes mellitus without complications: Secondary | ICD-10-CM

## 2012-01-25 NOTE — Telephone Encounter (Signed)
Pt is requesting a referral to Dr. Casimiro Needle Altheimer for Endocrinology.

## 2012-02-17 ENCOUNTER — Other Ambulatory Visit: Payer: Self-pay | Admitting: Internal Medicine

## 2012-03-24 ENCOUNTER — Other Ambulatory Visit: Payer: Self-pay | Admitting: Internal Medicine

## 2012-03-26 ENCOUNTER — Other Ambulatory Visit: Payer: Self-pay | Admitting: General Practice

## 2012-03-26 MED ORDER — SAXAGLIPTIN HCL 5 MG PO TABS: 5.0000 mg | ORAL_TABLET | Freq: Every day | ORAL | Status: DC

## 2012-07-04 ENCOUNTER — Other Ambulatory Visit: Payer: Self-pay | Admitting: Cardiovascular Disease

## 2012-07-07 ENCOUNTER — Other Ambulatory Visit: Payer: Self-pay | Admitting: Internal Medicine

## 2012-07-09 ENCOUNTER — Other Ambulatory Visit: Payer: Self-pay | Admitting: *Deleted

## 2012-07-09 MED ORDER — ZOLPIDEM TARTRATE 10 MG PO TABS: 10.0000 mg | ORAL_TABLET | Freq: Every evening | ORAL | Status: DC | PRN

## 2012-07-10 ENCOUNTER — Telehealth: Payer: Self-pay | Admitting: *Deleted

## 2012-07-10 NOTE — Telephone Encounter (Signed)
Left msg on triage stating [facility] has been trying to get ambien refill. Requesting to speak with nurse concerning refill pharmacy still waiting...Alex Meyers

## 2012-07-11 ENCOUNTER — Telehealth: Payer: Self-pay | Admitting: *Deleted

## 2012-07-11 NOTE — Telephone Encounter (Signed)
Called pt to let him know that his rx was called into [facility].

## 2012-09-04 ENCOUNTER — Other Ambulatory Visit: Payer: Self-pay | Admitting: Cardiovascular Disease

## 2012-09-13 ENCOUNTER — Other Ambulatory Visit: Payer: Self-pay | Admitting: Internal Medicine

## 2012-09-14 NOTE — Telephone Encounter (Signed)
Viagra called in to [phone]

## 2012-09-25 ENCOUNTER — Ambulatory Visit (INDEPENDENT_AMBULATORY_CARE_PROVIDER_SITE_OTHER): Payer: Medicare Other | Admitting: Internal Medicine

## 2012-09-25 ENCOUNTER — Encounter: Payer: Self-pay | Admitting: Internal Medicine

## 2012-09-25 VITALS — BP 118/80 | HR 62 | Temp 97.6°F | Resp 8 | Ht 72.0 in | Wt 176.8 lb

## 2012-09-25 DIAGNOSIS — K648 Other hemorrhoids: Secondary | ICD-10-CM

## 2012-09-25 HISTORY — DX: Other hemorrhoids: K64.8

## 2012-09-25 MED ORDER — HYDROCORTISONE ACETATE 25 MG RE SUPP: 25.0000 mg | Freq: Two times a day (BID) | RECTAL | Status: DC

## 2012-09-25 NOTE — Progress Notes (Signed)
Subjective:     Patient ID: Alex Meyers, male   DOB: 1940-10-20, 72 y.o.   MRN: [id number]  HPI Mr. Brassfield is a 72 yo gentleman with a hx of diabetes that presents with a 4 month hx of intermittent bleeding from the rectum.  He has self diagnosed this issue as hemorrhoids and refers to the episodes of bleeding as "hemorrhoids."  The bleeding is associated with hard bowel movements that require straining.  He will notice bright red blood in the toilet and on the toilet paper.  His stool is not black or tarry.  The blood is not mixed into the stool.  His BMs are not terribly painful.  He does notice some discomfort that is associated with a reducible bulge at his rectum.  He has been using Preparation H with good relief.  Past Medical History  Diagnosis Date  . Diabetes mellitus   . Coronary artery disease     s//p DES LAD and Dx1 01/2009  . Multiple thyroid nodules   . Personal history of colonic polyps 08/09/2002    ADENOMATOUS POLYP  . Hyperlipidemia 05-18-11    pt denies  . Hypertension 05-18-11    pt. denies   Past Surgical History  Procedure Laterality Date  . Colonoscopy  2004    per Dr. Melchor Amour, several polyps  . Coronary stent placement  01/22/2009    right side  . Wrist surgery      right side   Family History  Problem Relation Age of Onset  . Heart failure Mother   . Coronary artery disease Mother   . Cancer Father 43    bladder   . Cancer Sister     breast   History   Social History  . Marital Status: Divorced    Spouse Name: N/A    Number of Children: 3  . Years of Education: N/A   Occupational History  . LAWYER    Social History Main Topics  . Smoking status: Never Smoker   . Smokeless tobacco: Never Used  . Alcohol Use: Yes     Comment: 1/2 drinks per day  . Drug Use: No  . Sexually Active: Not on file   Other Topics Concern  . Not on file   Social History Narrative   Nelda Severe, Law school - [employer] . Married '78 the patient is divorced after 22  years of marriage. Has 3 daughters. He is an active attorney, formerly with [employer] but he has recently ('12) gone out on his own. Serially monogamous, does not always practice safe sex.     Current Outpatient Prescriptions on File Prior to Visit  Medication Sig Dispense Refill  . aspirin 81 MG tablet Take 1 tablet (81 mg total) by mouth daily.  30 tablet  0  . clopidogrel (PLAVIX) 75 MG tablet TAKE 1 TABLET EVERY DAY  30 tablet  1  . rosuvastatin (CRESTOR) 20 MG tablet Take 1 tablet (20 mg total) by mouth daily.  30 tablet  12  . saxagliptin HCl (ONGLYZA) 5 MG TABS tablet Take 1 tablet (5 mg total) by mouth daily.  30 tablet  0  . zolpidem (AMBIEN) 10 MG tablet Take 1 tablet (10 mg total) by mouth at bedtime as needed for sleep.  90 tablet  2   No current facility-administered medications on file prior to visit.     Review of Systems  Gastrointestinal: Positive for anal bleeding and rectal pain.  All other systems reviewed and  are negative.      Objective:   Physical Exam Filed Vitals:   09/25/12 1623  BP: 118/80  Pulse: 62  Temp: 97.6 F (36.4 C)  Resp: 8  General: well developed well nourished man resting comfortably in a chair in NAD.  HENT: [place]/AT, EOMI CV: deferred Pulm: nl WOB Abd: deferred Neuro: nl gait  Procedures: ANOSCOPY Indication - rectal bleeding/hematochezia Consent - after full explanation verbal consent obtained Patient positioned in the right lateral decubitus position Visual exam performed: no external hemorrhoid Digital exam performed: NST, palpable internal hemorrhoid 5 o'clock, not tender Anoscopy- anoscope carefully introduced. Mucosal swabbing above the anoscope performed: heme negative                   With indirect illumination full exam conducted revealing - large internal hemorrhoid 5 o'clock position, no stigmata of bleeding  Patient tolerated the procedure well.      Assessment:    I internviewed patient and performed  anoscopy. Agree with treatment plan as documented by Mr. Lucretia Roers, MSIII

## 2012-09-25 NOTE — Patient Instructions (Addendum)
Bleeding from internal hemorrhoid - seen on anoscopy. There was no blood above the anoscope - thus no colon lesions as source of bleeding. For flare of bleeding or pain use the Anusol HC suppository twice a day. To prevent flare - do not strain at the commode.   Hemorrhoids Hemorrhoids are enlarged (dilated) veins around the rectum. There are 2 types of hemorrhoids, and the type of hemorrhoid is determined by its location. Internal hemorrhoids occur in the veins just inside the rectum.They are usually not painful, but they may bleed.However, they may poke through to the outside and become irritated and painful. External hemorrhoids involve the veins outside the anus and can be felt as a painful swelling or hard lump near the anus.They are often itchy and may crack and bleed. Sometimes clots will form in the veins. This makes them swollen and painful. These are called thrombosed hemorrhoids. CAUSES Causes of hemorrhoids include:  Pregnancy. This increases the pressure in the hemorrhoidal veins.  Constipation.  Straining to have a bowel movement.  Obesity.  Heavy lifting or other activity that caused you to strain. TREATMENT Most of the time hemorrhoids improve in 1 to 2 weeks. However, if symptoms do not seem to be getting better or if you have a lot of rectal bleeding, your caregiver may perform a procedure to help make the hemorrhoids get smaller or remove them completely.Possible treatments include:  Rubber band ligation. A rubber band is placed at the base of the hemorrhoid to cut off the circulation.  Sclerotherapy. A chemical is injected to shrink the hemorrhoid.  Infrared light therapy. Tools are used to burn the hemorrhoid.  Hemorrhoidectomy. This is surgical removal of the hemorrhoid. HOME CARE INSTRUCTIONS   Increase fiber in your diet. Ask your caregiver about using fiber supplements.  Drink enough water and fluids to keep your urine clear or pale yellow.  Exercise  regularly.  Go to the bathroom when you have the urge to have a bowel movement. Do not wait.  Avoid straining to have bowel movements.  Keep the anal area dry and clean.  Only take over-the-counter or prescription medicines for pain, discomfort, or fever as directed by your caregiver. If your hemorrhoids are thrombosed:  Take warm sitz baths for 20 to 30 minutes, 3 to 4 times per day.  If the hemorrhoids are very tender and swollen, place ice packs on the area as tolerated. Using ice packs between sitz baths may be helpful. Fill a plastic bag with ice. Place a towel between the bag of ice and your skin.  Medicated creams and suppositories may be used or applied as directed.  Do not use a donut-shaped pillow or sit on the toilet for long periods. This increases blood pooling and pain. SEEK MEDICAL CARE IF:   You have increasing pain and swelling that is not controlled with your medicine.  You have uncontrolled bleeding.  You have difficulty or you are unable to have a bowel movement.  You have pain or inflammation outside the area of the hemorrhoids.  You have chills or an oral temperature above 102 F (38.9 C). MAKE SURE YOU:   Understand these instructions.  Will watch your condition.  Will get help right away if you are not doing well or get worse. Document Released: 06/17/2000 Document Revised: 09/12/2011 Document Reviewed: 05/31/2010 [facility] Patient Information 2013 [place], [place].

## 2012-09-25 NOTE — Assessment & Plan Note (Signed)
Assessment: pt has an internal hemorrhoid that is not actively bleeding at this time. Plan: - cortisone suppositories to be used at times of discomfort and bleeding to shrink the varicosity - will have pt contact us if bleeding becomes more frequent/brisk or if the pain/discomfort becomes intolerable

## 2012-10-12 ENCOUNTER — Other Ambulatory Visit: Payer: Self-pay | Admitting: Internal Medicine

## 2012-10-12 MED ORDER — ZOLPIDEM TARTRATE 10 MG PO TABS: 10.0000 mg | ORAL_TABLET | Freq: Every evening | ORAL | Status: DC | PRN

## 2012-10-15 ENCOUNTER — Telehealth: Payer: Self-pay | Admitting: Internal Medicine

## 2012-10-15 NOTE — Telephone Encounter (Signed)
Message copied by Newell Coral on Mon Oct 15, 2012  5:01 PM ------      Message from: Jacques Navy      Created: Fri Oct 12, 2012  4:44 PM       Mr. Maxfield wants to be scheduled for an annual CPX. Thanks ------

## 2012-10-15 NOTE — Telephone Encounter (Signed)
Lvmom for pt to call back for cpe

## 2012-10-16 NOTE — Telephone Encounter (Signed)
Pt has an appt for CPE 01/01/2013.

## 2012-10-24 ENCOUNTER — Ambulatory Visit (INDEPENDENT_AMBULATORY_CARE_PROVIDER_SITE_OTHER): Payer: Medicare Other | Admitting: Internal Medicine

## 2012-10-24 ENCOUNTER — Encounter: Payer: Self-pay | Admitting: Internal Medicine

## 2012-10-24 VITALS — BP 112/62 | HR 70 | Temp 98.1°F | Wt 173.4 lb

## 2012-10-24 DIAGNOSIS — K648 Other hemorrhoids: Secondary | ICD-10-CM

## 2012-10-24 MED ORDER — HYDROCORTISONE ACE-PRAMOXINE 2.5-1 % RE CREA: TOPICAL_CREAM | Freq: Three times a day (TID) | RECTAL | Status: DC

## 2012-10-24 NOTE — Assessment & Plan Note (Signed)
Diagnosed by primary care physician via anoscopy 09/25/2012 Continued problems with bleeding and prolapse despite cortisone suppository therapy Change to scheduled dosing and refer to GI to consider banding

## 2012-10-24 NOTE — Patient Instructions (Signed)
It was good to see you today. We have reviewed your prior records including labs and tests today Change hydrocortisone suppository to different hemorrhoid rectal cream -use as directed nightly and as needed for irritated bowel movements Your prescription(s) have been submitted to your pharmacy. Please take as directed and contact our office if you believe you are having problem(s) with the medication(s). we'll make referral to Gastroenterology to consider banding for your hemorrhoid treatment. Our office will contact you regarding appointment(s) once made. followup as needed if continued problems or pain

## 2012-10-24 NOTE — Progress Notes (Signed)
  Subjective:    Patient ID: Alex Meyers, male    DOB: 1941/06/24, 72 y.o.   MRN: [id number]  HPI  Complains of rectal bleeding History of same, office visit with primary March 25 for same reviewed Treated with cortisone suppositories for internal hemorrhoid -symptoms not significantly changed  Past Medical History  Diagnosis Date  . Diabetes mellitus   . Coronary artery disease     s//p DES LAD and Dx1 01/2009  . Multiple thyroid nodules   . Personal history of colonic polyps 08/09/2002    ADENOMATOUS POLYP  . Hyperlipidemia     pt denies  . Hypertension     pt. denies   Review of Systems  Gastrointestinal: Positive for blood in stool. Negative for abdominal pain, diarrhea, constipation and rectal pain.       Objective:   Physical Exam BP 112/62  Pulse 70  Temp(Src) 98.1 F (36.7 C) (Oral)  Wt 173 lb 6.4 oz (78.654 kg)  BMI 23.51 kg/m2  SpO2 96% Wt Readings from Last 3 Encounters:  10/24/12 173 lb 6.4 oz (78.654 kg)  09/25/12 176 lb 12.8 oz (80.196 kg)  11/09/11 176 lb (79.833 kg)   Constitutional: he appears well-developed and well-nourished. No distress.   Rectal: defer today Psychiatric: he has a normal mood and affect. behavior is normal. Judgment and thought content normal.  Lab Results  Component Value Date   WBC 4.8 11/26/2009   HGB 14.0 11/26/2009   HCT 40.0 11/26/2009   PLT 149.0* 11/26/2009   GLUCOSE 173* 12/27/2010   CHOL 116 12/27/2010   TRIG 43.0 12/27/2010   HDL 54.50 12/27/2010   LDLCALC 53 12/27/2010   ALT 30 12/27/2010   AST 27 12/27/2010   NA 138 12/27/2010   K 3.9 12/27/2010   CL 106 12/27/2010   CREATININE 0.9 12/27/2010   BUN 17 12/27/2010   CO2 25 12/27/2010   TSH 2.21 12/27/2010   PSA 1.07 12/27/2010   INR 1.2 ratio* 01/21/2009   HGBA1C 7.0* 11/09/2011        Assessment & Plan:   See problem list. Medications and labs reviewed today.

## 2012-10-31 ENCOUNTER — Other Ambulatory Visit: Payer: Self-pay | Admitting: Cardiovascular Disease

## 2012-11-02 ENCOUNTER — Telehealth: Payer: Self-pay

## 2012-11-02 DIAGNOSIS — Z202 Contact with and (suspected) exposure to infections with a predominantly sexual mode of transmission: Secondary | ICD-10-CM

## 2012-11-02 NOTE — Telephone Encounter (Signed)
Phone call from pt requesting a referral to Dr Ninetta Lights. He states he spoke to [facility] today at Dr [employer] office and she let him know he will need a referral. Please advise.

## 2012-11-02 NOTE — Telephone Encounter (Signed)
I will be happt to make a referral but need more information - Dr Ninetta Lights with infectious disease? For what dx does he need to see Dr Ninetta Lights? Once i have this info clarified, i will place order - thanks

## 2012-11-02 NOTE — Telephone Encounter (Signed)
Phone call back to pt. He states he was seen by a urologist and this urologist cleared him in Feb of having any signs or past infection of HPV. His ex partner eventually had to have a hysterectomy he states due abnormal PAPS because of HPV. He is requesting a 2nd opinion so he can have a additional doctors letter clearing him of present of past HPV infection

## 2012-11-03 NOTE — Telephone Encounter (Signed)
Refer done as requested 

## 2012-11-05 NOTE — Telephone Encounter (Signed)
Pt advised and will expect a call from [facility] with appt info 

## 2012-11-13 ENCOUNTER — Ambulatory Visit (INDEPENDENT_AMBULATORY_CARE_PROVIDER_SITE_OTHER): Payer: Medicare Other | Admitting: Cardiovascular Disease

## 2012-11-13 ENCOUNTER — Encounter: Payer: Self-pay | Admitting: Cardiovascular Disease

## 2012-11-13 VITALS — BP 120/68 | HR 70 | Ht 72.0 in | Wt 176.0 lb

## 2012-11-13 DIAGNOSIS — I2510 Atherosclerotic heart disease of native coronary artery without angina pectoris: Secondary | ICD-10-CM

## 2012-11-13 NOTE — Progress Notes (Signed)
HPI:   72 year old gentleman presenting for followup evaluation. The patient has coronary artery disease. He underwent stenting of the LAD and diagonal branches in 2000 and for treatment of exertional angina and an abnormal nuclear scan. He presents today for followup. His last treadmill study was in 2012 when he exercised for 15 minutes according to the Bruce protocol achieving a work load of 17.2 METS without angina or significant ST changes. Lipids have been excellent with last LDL in 2012 of 53.  The patient is doing well from a symptomatic perspective. He is running 20 miles per week. He recently ran a 10 mile race and 93 minutes. He has no exertional chest pain or tightness. He denies dyspnea, edema, palpitations, lightheadedness, or syncope. A friend of his recently died while jogging and he is very concerned about his risk of MI and cardiac death.  Outpatient Encounter Prescriptions as of 11/13/2012  Medication Sig Dispense Refill  . aspirin 81 MG tablet Take 1 tablet (81 mg total) by mouth daily.  30 tablet  0  . clopidogrel (PLAVIX) 75 MG tablet TAKE 1 TABLET EVERY DAY  30 tablet  1  . hydrocortisone-pramoxine (ANALPRAM-HC) 2.5-1 % rectal cream Place rectally 3 (three) times daily.  30 g  0  . pioglitazone (ACTOS) 30 MG tablet Take 45 mg by mouth 2 (two) times daily.      . rosuvastatin (CRESTOR) 20 MG tablet Take 1 tablet (20 mg total) by mouth daily.  30 tablet  12  . saxagliptin HCl (ONGLYZA) 5 MG TABS tablet Take 1 tablet (5 mg total) by mouth daily.  30 tablet  0  . sildenafil (VIAGRA) 100 MG tablet       . zolpidem (AMBIEN) 10 MG tablet Take 1 tablet (10 mg total) by mouth at bedtime as needed for sleep.  30 tablet  5  . [DISCONTINUED] clopidogrel (PLAVIX) 75 MG tablet TAKE 1 TABLET EVERY DAY  30 tablet  1  . [DISCONTINUED] clopidogrel (PLAVIX) 75 MG tablet TAKE 1 TABLET EVERY DAY  30 tablet  1  . [DISCONTINUED] hydrocortisone (ANUSOL-HC) 25 MG suppository Place 1 suppository (25  mg total) rectally 2 (two) times daily.  12 suppository  3   No facility-administered encounter medications on file as of 11/13/2012.    No Known Allergies  Past Medical History  Diagnosis Date  . Diabetes mellitus   . Coronary artery disease     s//p DES LAD and Dx1 01/2009  . Multiple thyroid nodules   . Personal history of colonic polyps 08/09/2002    ADENOMATOUS POLYP  . Hyperlipidemia     pt denies  . Hypertension     pt. denies    ROS: Negative except as per HPI  BP 120/68  Pulse 70  Ht 6' (1.829 m)  Wt 79.833 kg (176 lb)  BMI 23.86 kg/m2  SpO2 97%  PHYSICAL EXAM: Pt is alert and oriented, physically fit gentleman in NAD HEENT: normal Neck: JVP - normal, carotids 2+= without bruits Lungs: CTA bilaterally CV: RRR without murmur or gallop Abd: soft, NT, Positive BS, no hepatomegaly Ext: no C/C/E, distal pulses intact and equal Skin: warm/dry no rash  EKG:  Normal sinus rhythm 70 beats per minute, cannot rule out age-indeterminate septal infarction.  ASSESSMENT AND PLAN: 1. Coronary artery disease, native vessel. The patient is stable without anginal symptoms. He tolerated dual antiplatelet therapy with aspirin and Plavix. He several years out from stenting of the LAD and diagonal. Will need  to consider discontinuation of Plavix. Because he is involved in fairly vigorous exercise, will repeat a treadmill study to exclude ischemic changes.  2. Hyperlipidemia. Patient is on Crestor 20 mg daily. He is due for lipids and LFTs.  Tonny Bollman 11/13/2012 10:08 AM

## 2012-11-13 NOTE — Patient Instructions (Addendum)
Your physician has requested that you have an exercise tolerance test. For further information please visit [email or web address] Please also follow instruction sheet, as given.  Your physician wants you to follow-up in: 1 YEAR with Dr Excell Seltzer.  You will receive a reminder letter in the mail two months in advance. If you don't receive a letter, please call our office to schedule the follow-up appointment.  Your physician recommends that you continue on your current medications as directed. Please refer to the Current Medication list given to you today.

## 2012-11-23 ENCOUNTER — Ambulatory Visit (INDEPENDENT_AMBULATORY_CARE_PROVIDER_SITE_OTHER): Payer: Medicare Other | Admitting: Cardiovascular Disease

## 2012-11-23 DIAGNOSIS — I2510 Atherosclerotic heart disease of native coronary artery without angina pectoris: Secondary | ICD-10-CM

## 2012-11-23 NOTE — Progress Notes (Signed)
Exercise Treadmill Test  Pre-Exercise Testing Evaluation Rhythm: normal sinus  Rate: 69     Test  Exercise Tolerance Test Ordering MD: Tonny Bollman, MD  Interpreting MD: Tonny Bollman, MD  Unique Test No: 1  Treadmill:  1  Indication for ETT: known ASHD  Contraindication to ETT: No   Stress Modality: exercise - treadmill  Cardiac Imaging Performed: non   Protocol: standard Bruce - maximal  Max BP:  216/77  Max MPHR (bpm):  151 85% MPR (bpm): 128  MPHR obtained (bpm):  139 % MPHR obtained:  92  Reached 85% MPHR (min:sec):  12:24 Total Exercise Time (min-sec):  16:01  Workload in METS:  18.7 Borg Scale: 17  Reason ETT Terminated:  desired heart rate attained    ST Segment Analysis At Rest: normal ST segments - no evidence of significant ST depression With Exercise: no evidence of significant ST depression  Other Information Arrhythmia:  No Angina during ETT:  absent (0) Quality of ETT:  diagnostic  ETT Interpretation:  normal - no evidence of ischemia by ST analysis  Comments: Outstanding exercise tolerance. No chest pain, significant arrhythmia, or significant ST changes with exertion to level 6 of the Bruce protocol.  Recommendations: Keep up the good work!

## 2012-11-27 ENCOUNTER — Ambulatory Visit (INDEPENDENT_AMBULATORY_CARE_PROVIDER_SITE_OTHER): Payer: Medicare Other | Admitting: Internal Medicine

## 2012-11-27 ENCOUNTER — Encounter: Payer: Self-pay | Admitting: Internal Medicine

## 2012-11-27 VITALS — BP 115/72 | HR 73 | Temp 98.1°F | Ht 72.0 in | Wt 173.0 lb

## 2012-11-27 DIAGNOSIS — Z202 Contact with and (suspected) exposure to infections with a predominantly sexual mode of transmission: Secondary | ICD-10-CM

## 2012-11-27 DIAGNOSIS — Z20828 Contact with and (suspected) exposure to other viral communicable diseases: Secondary | ICD-10-CM

## 2012-11-27 NOTE — Progress Notes (Signed)
[facility] CLIN[facility] NOTE  RFV: initial visit for HPV Subjective:    Patient ID: Alex Meyers, male    DOB: June 04, 1941, 72 y.o.   MRN: [id number]  HPI Alex Meyers is a 72 yo Male who presents to clinic for evaluation of HPV exposure from 2006-2012. HE reports that in 2006, he was in relationship with male x 6 yrs. Started to noticed at 13yr mark. She said she had "bad pap", then had protected sex for awhile until her gynecologist stated that her pap was normal, Then resumed unprotected sex. Then had 2 more episodes of "bad pap". They eventually broke up in sep 2012. Gynecologist told his GF due to cancer to uterus. Given her history she may have acquired HPV from former spouse(who had several extramarital affairs) or him (the patient). Still are friends.  He is anxious since he is concerned of being an hpv carrier or giving hpv to someone else. He recently went to [facility] for evaluation for HPV with Alex Meyers.  He went to see urologist 3-4 months ago to see if he can verify if he is an hpv. Where he did urethra swab where he was "negative" hpv carrier.   Previously married, until April 2001. During his relationship between 2006-2012 they would break up and during those times, he would have other male partners.  Hx of STI : 30 yr ago had an std but unsure what he had  Soc: business, bankruptcy, Conservation officer, historic buildings since 39s. A year in [place]. [place]. Divorced. 3 daughters. ( 2 daughters in [place] and one in [place]).  Review of Systems     Objective:   Physical Exam BP 115/72  Pulse 73  Temp(Src) 98.1 F (36.7 C) (Oral)  Ht 6' (1.829 m)  Wt 173 lb (78.472 kg)  BMI 23.46 kg/m2 No physical exam for this visit       Assessment & Plan:   45 min spent with patient with greater than 50% spent on counseling of hpv prevention and disease transmission. There are no reliable blood test to do serology testing. Based upon his history of sexual partners as well as the partner in questions with hpv  related uterine cancer, it is difficult to ascertain who had HPV prior to being intimately involved. Often men can tell that they have genital warts but for women it can be more challenging. He denies having any genital warts and recently screened by his urologist for current symptoms.   Unfortunately, unable to provide any more information for him other than he should continue to have protected sex with new partners. May consider disclosing to his new partners that one of his previous partners had HPV.    [email or web address]

## 2012-12-01 ENCOUNTER — Other Ambulatory Visit: Payer: Self-pay | Admitting: Internal Medicine

## 2012-12-12 ENCOUNTER — Encounter: Payer: Self-pay | Admitting: Internal Medicine

## 2012-12-12 ENCOUNTER — Ambulatory Visit (INDEPENDENT_AMBULATORY_CARE_PROVIDER_SITE_OTHER): Payer: Medicare Other | Admitting: Internal Medicine

## 2012-12-12 VITALS — BP 128/70 | HR 65 | Temp 97.4°F | Wt 174.0 lb

## 2012-12-12 DIAGNOSIS — K648 Other hemorrhoids: Secondary | ICD-10-CM

## 2012-12-12 DIAGNOSIS — J029 Acute pharyngitis, unspecified: Secondary | ICD-10-CM

## 2012-12-12 NOTE — Assessment & Plan Note (Signed)
Continued intermittent bleeding with minimal discomfort. Flares after hard exertion, e.g. Running a marathon. Analpram helps. No real pain. He is on anti-platelet agent  Plan Continue good prevention: bulk laxative, sitz baths  Con't Analpram  For persistent annoyance and bleeding - see Dr. Rhea Belton for diagnosis (flex sig ?) and possible banding.

## 2012-12-12 NOTE — Progress Notes (Signed)
  Subjective:    Patient ID: Alex Meyers, male    DOB: 1941/04/01, 72 y.o.   MRN: [id number]  HPI Alex Meyers present for a 3 days h/o sore throat. No fever, no odynophagia.   He has a question about his internal hemorrhoids. Had anoscopy. Was treated with topical agent with some help. He had recurrent bleeding, saw Alex Meyers and had another round of Anapram. He now has mild intermittent bleeding and is asking about referral for banding.   PMH, FamHx and SocHx reviewed for any changes and relevance. Current Outpatient Prescriptions on File Prior to Visit  Medication Sig Dispense Refill  . aspirin 81 MG tablet Take 1 tablet (81 mg total) by mouth daily.  30 tablet  0  . Canagliflozin (INVOKANA) 100 MG TABS Take 2 tablets by mouth daily.      . clopidogrel (PLAVIX) 75 MG tablet TAKE 1 TABLET EVERY DAY  30 tablet  1  . hydrocortisone-pramoxine (ANALPRAM-HC) 2.5-1 % rectal cream PLACE RECTALLY 3 (THREE) TIMES DAILY.  30 g  0  . pioglitazone (ACTOS) 30 MG tablet Take 45 mg by mouth 2 (two) times daily.      . rosuvastatin (CRESTOR) 20 MG tablet Take 1 tablet (20 mg total) by mouth daily.  30 tablet  12  . saxagliptin HCl (ONGLYZA) 5 MG TABS tablet Take 1 tablet (5 mg total) by mouth daily.  30 tablet  0  . sildenafil (VIAGRA) 100 MG tablet       . zolpidem (AMBIEN) 10 MG tablet Take 1 tablet (10 mg total) by mouth at bedtime as needed for sleep.  30 tablet  5   No current facility-administered medications on file prior to visit.      Review of Systems System review is negative for any constitutional, cardiac, pulmonary, GI or neuro symptoms or complaints other than as described in the HPI.     Objective:   Physical Exam Filed Vitals:   12/12/12 1653  BP: 128/70  Pulse: 65  Temp: 97.4 F (36.3 C)   Gen'l - athletic man in no distress HEENT - throat clear - w/o exudate or erythema. No thyromegaly or tenderness Derm - minor subcutaneous hemorrhage dorsum of right hand        Assessment & Plan:  Sore throat - no evidence of bacterial infection  Plan Gargle of choice

## 2012-12-20 ENCOUNTER — Telehealth: Payer: Self-pay | Admitting: *Deleted

## 2012-12-20 NOTE — Telephone Encounter (Signed)
Message copied by Florene Glen on Thu Dec 20, 2012 11:35 AM ------      Message from: Beverley Fiedler      Created: Wed Dec 12, 2012 10:05 PM       Pt may want to be seen for hemorrhoidal bleeding??      Per Dr. Debby Bud.  If so, clinic visit ------

## 2012-12-20 NOTE — Telephone Encounter (Signed)
Pt never called in for an appt.

## 2013-01-01 ENCOUNTER — Other Ambulatory Visit (INDEPENDENT_AMBULATORY_CARE_PROVIDER_SITE_OTHER): Payer: Medicare Other

## 2013-01-01 ENCOUNTER — Encounter: Payer: Self-pay | Admitting: Internal Medicine

## 2013-01-01 ENCOUNTER — Ambulatory Visit (INDEPENDENT_AMBULATORY_CARE_PROVIDER_SITE_OTHER): Payer: Medicare Other | Admitting: Internal Medicine

## 2013-01-01 VITALS — BP 110/60 | HR 69 | Temp 97.3°F | Resp 12 | Ht 72.0 in | Wt 174.4 lb

## 2013-01-01 DIAGNOSIS — Z0000 Encounter for general adult medical examination without abnormal findings: Secondary | ICD-10-CM

## 2013-01-01 DIAGNOSIS — E041 Nontoxic single thyroid nodule: Secondary | ICD-10-CM

## 2013-01-01 DIAGNOSIS — E119 Type 2 diabetes mellitus without complications: Secondary | ICD-10-CM

## 2013-01-01 DIAGNOSIS — I2510 Atherosclerotic heart disease of native coronary artery without angina pectoris: Secondary | ICD-10-CM

## 2013-01-01 DIAGNOSIS — E785 Hyperlipidemia, unspecified: Secondary | ICD-10-CM

## 2013-01-01 DIAGNOSIS — I10 Essential (primary) hypertension: Secondary | ICD-10-CM

## 2013-01-01 DIAGNOSIS — Z23 Encounter for immunization: Secondary | ICD-10-CM

## 2013-01-01 DIAGNOSIS — K648 Other hemorrhoids: Secondary | ICD-10-CM

## 2013-01-01 LAB — LIPID PANEL
Cholesterol: 145 mg/dL (ref 0–200)
LDL Cholesterol: 50 mg/dL (ref 0–99)
Triglycerides: 67 mg/dL (ref 0.0–149.0)

## 2013-01-01 LAB — COMPREHENSIVE METABOLIC PANEL
AST: 21 U/L (ref 0–37)
Alkaline Phosphatase: 46 U/L (ref 39–117)
BUN: 21 mg/dL (ref 6–23)
Creatinine, Ser: 1.2 mg/dL (ref 0.4–1.5)
Total Bilirubin: 0.7 mg/dL (ref 0.3–1.2)

## 2013-01-01 LAB — HEPATIC FUNCTION PANEL
ALT: 16 U/L (ref 0–53)
Alkaline Phosphatase: 46 U/L (ref 39–117)
Bilirubin, Direct: 0.2 mg/dL (ref 0.0–0.3)
Total Protein: 6.9 g/dL (ref 6.0–8.3)

## 2013-01-01 LAB — T4, FREE: Free T4: 0.79 ng/dL (ref 0.60–1.60)

## 2013-01-01 NOTE — Patient Instructions (Addendum)
Thanks for coming in to see me. You are in very good shape no matter what your age.  Labs today and the results will be available on MyChart, please sign up.  Immunizations today: tetanus and pneumonia vaccine, return in 3 weeks for shingles vaccine.   A request for an appointment with Dr. Rhea Belton has been entered.  You should return in 1 year for routine exam or sooner as needed.

## 2013-01-01 NOTE — Progress Notes (Signed)
Subjective:    Patient ID: Alex Meyers, male    DOB: March 06, 1941, 72 y.o.   MRN: [id number]  HPI Alex Meyers presents for a general medical wellness exam. He is feeling well and doing well.  The patient is here for annual Medicare wellness examination and management of other chronic and acute problems.   The risk factors are reflected in the social history.  The roster of all physicians providing medical care to patient - is listed in the Snapshot section of the chart.  Activities of daily living:  The patient is 100% inedpendent in all ADLs: dressing, toileting, feeding as well as independent mobility  Home safety : The patient has smoke detectors in the home. Falls - fell while running, no LOC. They wear seatbelts. No firearms at home. There is no violence in the home.   There is no risks for hepatitis, STDs or HIV. There is no history of blood transfusion. They have no travel history to infectious disease endemic areas of the world.  The patient has seen their dentist in the last six month. They have seen their eye doctor in the last year. They deny any hearing difficulty and have not had audiologic testing in the last year.    They do not  have excessive sun exposure. Discussed the need for sun protection: hats, long sleeves and use of sunscreen if there is significant sun exposure.   Diet: the importance of a healthy diet is discussed. They do have a healthy diet.  The patient has a regular exercise program: running/tennis/light weight work , 60-90 duration, 4 per week.  The benefits of regular aerobic exercise were discussed.  Depression screen: there are no signs or vegative symptoms of depression- irritability, change in appetite, anhedonia, sadness/tearfullness.  Cognitive assessment: the patient manages all their financial and personal affairs and is actively engaged.   The following portions of the patient's history were reviewed and updated as appropriate: allergies,  current medications, past family history, past medical history,  past surgical history, past social history  and problem list.  Vision, hearing, body mass index were assessed and reviewed.   During the course of the visit the patient was educated and counseled about appropriate screening and preventive services including : fall prevention , diabetes screening, nutrition counseling, colorectal cancer screening, and recommended immunizations.  Past Medical History  Diagnosis Date  . Diabetes mellitus   . Coronary artery disease     s//p DES LAD and Dx1 01/2009  . Multiple thyroid nodules   . Personal history of colonic polyps 08/09/2002    ADENOMATOUS POLYP  . Hyperlipidemia     pt denies  . Hypertension     pt. denies   Past Surgical History  Procedure Laterality Date  . Colonoscopy  2004    per Dr. Melchor Amour, several polyps  . Coronary stent placement  01/22/2009    right side  . Wrist surgery      right side   Family History  Problem Relation Age of Onset  . Heart failure Mother   . Coronary artery disease Mother   . Cancer Father 66    bladder   . Cancer Sister     breast   History   Social History  . Marital Status: Divorced    Spouse Name: N/A    Number of Children: 3  . Years of Education: N/A   Occupational History  . LAWYER    Social History Main Topics  . Smoking status:  Never Smoker   . Smokeless tobacco: Never Used  . Alcohol Use: Yes     Comment: 1/2 drinks per day  . Drug Use: No  . Sexually Active: Not on file   Other Topics Concern  . Not on file   Social History Narrative   Alex Meyers, Law school - [employer] . Married '78 the patient is divorced after 22 years of marriage. Has 3 daughters. He is an active attorney, formerly with [employer] but he has recently ('12) gone out on his own. Serially monogamous, does not always practice safe sex.        Review of Systems Constitutional:  Negative for fever, chills, activity change and  unexpected weight change.  HEENT:  Negative for hearing loss, ear pain, congestion, neck stiffness and postnasal drip. Negative for sore throat or swallowing problems. Negative for dental complaints.   Eyes: Negative for vision loss or change in visual acuity.  Respiratory: Negative for chest tightness and wheezing. Negative for DOE.   Cardiovascular: Negative for chest pain or palpitations. No decreased exercise tolerance Gastrointestinal: No change in bowel habit. No bloating or gas. No reflux or indigestion Genitourinary: Negative for urgency, frequency, flank pain and difficulty urinating.  Musculoskeletal: Negative for myalgias, back pain, arthralgias and gait problem.  Neurological: Negative for dizziness, tremors, weakness and headaches.  Hematological: Negative for adenopathy.  Psychiatric/Behavioral: Negative for behavioral problems and dysphoric mood.       Objective:   Physical Exam Filed Vitals:   01/01/13 1441  BP: 110/60  Pulse: 69  Temp: 97.3 F (36.3 C)  Resp: 12   Wt Readings from Last 3 Encounters:  01/01/13 174 lb 6.4 oz (79.107 kg)  12/12/12 174 lb (78.926 kg)  11/27/12 173 lb (78.472 kg)   Gen'l: Well nourished well developed white male in no acute distress  HEENT: Head: Normocephalic and atraumatic. Right Ear: External ear normal. EAC with cerumen/TM obscurred. Left Ear: External ear normal.  EAC/TM nl. Nose: Nose normal. Mouth/Throat: Oropharynx is clear and moist. Dentition - native, in good repair. No buccal or palatal lesions. Posterior pharynx clear. Eyes: Conjunctivae and sclera clear. EOM intact. Pupils are equal, round, and reactive to light. Right eye exhibits no discharge. Left eye exhibits no discharge. Neck: Normal range of motion. Neck supple. No JVD present. No tracheal deviation present. No thyromegaly present.  Cardiovascular: Normal rate, regular rhythm, no gallop, no friction rub, no murmur heard.      Quiet precordium. 2+ radial and DP pulses  . No carotid bruits Pulmonary/Chest: Effort normal. No respiratory distress or increased WOB, no wheezes, no rales. No chest wall deformity or CVAT. Abdomen: Soft. Bowel sounds are normal in all quadrants. He exhibits no distension, no tenderness, no rebound or guarding, No heptosplenomegaly  Genitourinary:  deferred to GU Musculoskeletal: Normal range of motion. He exhibits no edema and no tenderness.       Small and large joints without redness, synovial thickening or deformity. Full range of motion preserved about all small, median and large joints.  Lymphadenopathy:    He has no cervical or supraclavicular adenopathy.  Neurological: He is alert and oriented to person, place, and time. CN II-XII intact. DTRs 2+ and symmetrical biceps, radial and patellar tendons. Cerebellar function normal with no tremor, rigidity, normal gait and station.  Skin: Skin is warm and dry. No rash noted. No erythema.  Psychiatric: He has a normal mood and affect. His behavior is normal. Thought content normal.  Assessment & Plan:

## 2013-01-02 ENCOUNTER — Telehealth: Payer: Self-pay

## 2013-01-02 NOTE — Assessment & Plan Note (Signed)
Good life-style practice with exercise and appropriate diet.  Lab Results  Component Value Date   HGBA1C 7.2* 01/01/2013   Close to goal of 7% or less  Plan Management per Dr. Leslie Dales

## 2013-01-02 NOTE — Assessment & Plan Note (Signed)
Interval history w/o major illness, surgery or injury. Physical exam, sans rectal, is normal. Lab results reviewed and normal. He is current with colorectal cancer screening and prostate screening per Urology. Immunizations are brought up to date, he will return in 2-3 weeks for shingles vaccine.  In summary  A very fit appearing man, looking younger than his stated age, who is medically stable.

## 2013-01-02 NOTE — Assessment & Plan Note (Signed)
BP Readings from Last 3 Encounters:  01/01/13 110/60  12/12/12 128/70  11/27/12 115/72   Excellent BP levels.

## 2013-01-02 NOTE — Assessment & Plan Note (Signed)
Current with cardiology follow up: last echo May '13 - normal; last GXT May '14 normal  Plan  continue risk factor reduction  Follow up with Dr. Excell Seltzer as instructed

## 2013-01-02 NOTE — Assessment & Plan Note (Signed)
lab reveals LDL 52, HDL 80 - excellent control. Liver functions are normal.  Plan - no change in medication

## 2013-01-02 NOTE — Assessment & Plan Note (Signed)
Followed in [facility]. There has been no change in this nodule and no treatment has been required.  Follow up will be directed by his thyroid specialist.

## 2013-01-02 NOTE — Assessment & Plan Note (Signed)
Alex Meyers continues to have blood spotting with BM on occasion.  Plan  Refer to Dr. Rhea Belton for GI evaluation - possible banding procedure.

## 2013-01-02 NOTE — Telephone Encounter (Signed)
Patient's after visit summary from yesterdays office visit has been mailed since he forgot it yesterday.

## 2013-01-06 ENCOUNTER — Encounter: Payer: Self-pay | Admitting: Internal Medicine

## 2013-01-08 ENCOUNTER — Other Ambulatory Visit: Payer: Self-pay | Admitting: Cardiovascular Disease

## 2013-01-09 ENCOUNTER — Ambulatory Visit (INDEPENDENT_AMBULATORY_CARE_PROVIDER_SITE_OTHER): Payer: Medicare Other | Admitting: Internal Medicine

## 2013-01-09 ENCOUNTER — Encounter: Payer: Self-pay | Admitting: Internal Medicine

## 2013-01-09 VITALS — BP 114/79 | HR 71 | Temp 97.2°F | Wt 175.0 lb

## 2013-01-09 DIAGNOSIS — Z202 Contact with and (suspected) exposure to infections with a predominantly sexual mode of transmission: Secondary | ICD-10-CM

## 2013-01-09 DIAGNOSIS — Z20828 Contact with and (suspected) exposure to other viral communicable diseases: Secondary | ICD-10-CM
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
## 2013-01-17 ENCOUNTER — Other Ambulatory Visit: Payer: Self-pay | Admitting: Internal Medicine

## 2013-01-17 ENCOUNTER — Telehealth: Payer: Self-pay | Admitting: Internal Medicine

## 2013-01-17 NOTE — Telephone Encounter (Addendum)
lmom for pt to call me back; Dr Debby Bud ordered Analpram, but I do not know if it worked. Pt had internal hemorrhoids noted on 05/18/11 COLON.

## 2013-01-18 ENCOUNTER — Telehealth: Payer: Self-pay

## 2013-01-18 NOTE — Telephone Encounter (Signed)
Received a fax from [facility] [phone] stating patient's insurance will only pay for 15 tablets/30 days for Zolpidem 10 mg. Patient would like a PA started to try and received 30 tablets/month instead of 15. Authorization form has been filled out and submitted to insurance.

## 2013-01-23 ENCOUNTER — Ambulatory Visit: Payer: Medicare Other | Admitting: Internal Medicine

## 2013-01-23 ENCOUNTER — Encounter: Payer: Self-pay | Admitting: Internal Medicine

## 2013-01-23 NOTE — Telephone Encounter (Signed)
Pt never called back; he will see Dr Rhea Belton today.

## 2013-01-25 NOTE — Telephone Encounter (Signed)
Phone call from a representative at [facility] [phone] stating the fax for prior authorization for Zolpidem is not needed as long as it is ran for 1 tablet per day. This information was faxed to the pharmacy. Things got messed up when the pharmacy dispensed #15 then tried to get authorization for #90.

## 2013-01-31 ENCOUNTER — Encounter: Payer: Self-pay | Admitting: Internal Medicine

## 2013-02-05 ENCOUNTER — Encounter: Payer: Self-pay | Admitting: Internal Medicine

## 2013-02-05 ENCOUNTER — Ambulatory Visit (INDEPENDENT_AMBULATORY_CARE_PROVIDER_SITE_OTHER): Payer: Medicare Other | Admitting: Internal Medicine

## 2013-02-05 VITALS — BP 110/60 | HR 78 | Ht 72.0 in | Wt 172.0 lb

## 2013-02-05 DIAGNOSIS — Z860100 Personal history of colon polyps, unspecified: Secondary | ICD-10-CM

## 2013-02-05 DIAGNOSIS — K648 Other hemorrhoids: Secondary | ICD-10-CM

## 2013-02-05 NOTE — Progress Notes (Signed)
  Subjective:    Patient ID: Alex Meyers, male    DOB: [birth year], 72 y.o.   MRN: [id number]  HPI Mr. Zimny is a 72 year-old male with a past medical history of CAD (status post PCI July 2010) on clopidogrel, hypertension, hyperlipidemia, diabetes, internal hemorrhoids, left colon diverticulosis and adenomatous colon polyps who seen in follow-up. He has been seen by Dr. Debby Bud over the last several months for intermittent rectal bleeding though associated with internal hemorrhoids. He reports painless bright red blood per rectum occurring with most bowel movements. He reports he notices the blood more if he's had any straining at bowel movement. The blood is both on the toilet tissue and can occasionally drip into the toilet. It is always painless. He does not feel any perianal bulge or tenderness wiping. The bleeding is usually self-limited and stops by the end of his bowel movement. He did try Anusol HC suppositories without much benefit and was switched to Analpram also without significant improvement. He denies diarrhea or constipation. No melena. He is still very active and running on a regular basis. He is also continuing to work full time. No abdominal pain.  Review of Systems As per history of present illness, otherwise negative  Current Medications, Allergies, Past Medical History, Past Surgical History, Family History and Social History were reviewed in [employer] record.     Objective:   Physical Exam BP 110/60  Pulse 78  Ht 6' (1.829 m)  Wt 172 lb (78.019 kg)  BMI 23.32 kg/m2 Constitutional: Well-developed and well-nourished. No distress. HEENT: Normocephalic and atraumatic.  No scleral icterus. Abdominal: Soft, nontender, nondistended. Bowel sounds active throughout. There are no masses palpable. No hepatosplenomegaly. Neurological: Alert and oriented to person place and time. Psychiatric: Normal mood and affect. Behavior is normal.  Colonoscopy -  05/18/2011, exam to the cecum with good prep. 8 sessile polyps all less than 6 mm removed from cecum to rectosigmoid colon. Mild left colon diverticulosis and internal hemorrhoids. Pathology = 5 fragments of tubular adenoma, other fragments benign lymphoid polyp or benign colorectal mucosa. No high-grade dysplasia or malignancy  Anoscopy by Dr. Debby Bud, 09/25/2012 -- "large internal hemorrhoid 5 o'clock position, no stigmata of bleeding"     Assessment & Plan:  72 year-old male with a past medical history of CAD (status post PCI July 2010) on clopidogrel, hypertension, hyperlipidemia, diabetes, internal hemorrhoids, left colon diverticulosis and adenomatous colon polyps who seen in follow-up  1.  Internal hemorrhoids with bleeding -- the patient has a documented history of internal hemorrhoids both at colonoscopy in November 2012 and at anoscopy in March 2014.  His description of bleeding sounds very consistent with internal hemorrhoids, likely exacerbated by chronic clopidogrel use.  We have discussed internal hemorrhoids today as well as treatment options. I feel that he would be very appropriate for office banding with Dr. Leone Payor. We discussed how this would require 3 separate office visits with banding of each hemorrhoidal venous column over 3 sessions. Fortunately this procedure can be done without interrupting his antiplatelet therapy.  After discussion of the procedure he is agreeable to proceed. I will schedule this with Dr. Leone Payor.  He will call with any questions or concerns  2.  History of adenomatous colon polyps -- 5 colon polyps removed in November 2012, he is due for repeat screening/surveillance colonoscopy in November 2015

## 2013-02-06 NOTE — Progress Notes (Signed)
I agree - thanks for sending him

## 2013-02-11 ENCOUNTER — Telehealth: Payer: Self-pay | Admitting: Internal Medicine

## 2013-02-11 NOTE — Telephone Encounter (Signed)
We had set patient up for hemorrhoid ligation - he is on clopidogrel - would be best to hold that 5 days before and after procedure to reduce risk of bleeding.  I explained this to him and that will need to doublecheck with Dr. Excell Seltzer about holding clopidogrel - suspect no problem and then arrange for ligation .  I will let him know.

## 2013-02-12 NOTE — Telephone Encounter (Signed)
Spoke to patient and set up appointment for 03/14/13 at 8:30am, we discussed holding his Plavix and he verbalized understanding to do that 5 days prior to the banding.

## 2013-02-12 NOTE — Telephone Encounter (Signed)
Dr. Excell Seltzer is ok with holding clopidogrel  Plan will be to hold clopidogrel 5 days before hemorrhoid ligation and then at least 5 days after (will discuss with patient at [facility])  Patti to schedule the ligation and inform patient about when to stop clopidogrel

## 2013-02-13 ENCOUNTER — Encounter: Payer: Medicare Other | Admitting: Internal Medicine

## 2013-03-10 ENCOUNTER — Other Ambulatory Visit: Payer: Self-pay | Admitting: Cardiovascular Disease

## 2013-03-12 ENCOUNTER — Telehealth: Payer: Self-pay | Admitting: Internal Medicine

## 2013-03-12 NOTE — Telephone Encounter (Signed)
Alex Meyers he can reschedule for any open spot

## 2013-03-14 ENCOUNTER — Encounter: Payer: Medicare Other | Admitting: Internal Medicine

## 2013-04-08 ENCOUNTER — Encounter: Payer: Self-pay | Admitting: Internal Medicine

## 2013-04-08 ENCOUNTER — Ambulatory Visit (INDEPENDENT_AMBULATORY_CARE_PROVIDER_SITE_OTHER): Payer: Medicare Other | Admitting: Internal Medicine

## 2013-04-08 VITALS — BP 110/68 | HR 72 | Ht 72.0 in | Wt 173.4 lb

## 2013-04-08 DIAGNOSIS — K648 Other hemorrhoids: Secondary | ICD-10-CM

## 2013-04-08 NOTE — Patient Instructions (Addendum)
Remain off the clopidogrel until Monday October 13th when you may re-start it.  Call Dr. Leone Payor on his cell # [phone] mid day on Monday October 13th to report how your doing.   HEMORRHOID BANDING PROCEDURE    FOLLOW-UP CARE   1. The procedure you have had should have been relatively painless since the banding of the area involved does not have nerve endings and there is no pain sensation.  The rubber band cuts off the blood supply to the hemorrhoid and the band may fall off as soon as 48 hours after the banding (the band may occasionally be seen in the toilet bowl following a bowel movement). You may notice a temporary feeling of fullness in the rectum which should respond adequately to plain Tylenol or Motrin.  2. Following the banding, avoid strenuous exercise that evening and resume full activity the next day.  A sitz bath (soaking in a warm tub) or bidet is soothing, and can be useful for cleansing the area after bowel movements.     3. To avoid constipation, take two tablespoons of natural wheat bran, natural oat bran, flax, Benefiber or any over the counter fiber supplement and increase your water intake to 7-8 glasses daily.    4. Unless you have been prescribed anorectal medication, do not put anything inside your rectum for two weeks: No suppositories, enemas, fingers, etc.  5. Occasionally, you may have more bleeding than usual after the banding procedure.  This is often from the untreated hemorrhoids rather than the treated one.  Don't be concerned if there is a tablespoon or so of blood.  If there is more blood than this, lie flat with your bottom higher than your head and apply an ice pack to the area. If the bleeding does not stop within a half an hour or if you feel faint, call our office at [phone] or go to the emergency room.  6. Problems are not common; however, if there is a substantial amount of bleeding, severe pain, chills, fever or difficulty passing urine  (very rare) or other problems, you should call us at [phone] or report to the nearest emergency room.  7. Do not stay seated continuously for more than 2-3 hours for a day or two after the procedure.  Tighten your buttock muscles 10-15 times every two hours and take 10-15 deep breaths every 1-2 hours.  Do not spend more than a few minutes on the toilet if you cannot empty your bowel; instead re-visit the toilet at a later time.    We will decide when to do the next banding depending on how you do.  Do not use the Anal pram per Dr. Leone Payor for now.  I appreciate the opportunity to care for you.

## 2013-04-08 NOTE — Assessment & Plan Note (Addendum)
Ligated RP column today. To hold clopidogrel x 1 week and then call me with update and to plan timing of follow-up and next ligation - if needed. RP column clearly the most inflamed and largest. He does not have difficulties with defecation but does notice some problems around running - he sometimes forces defecation prior to runs which seems to predispose to flares.

## 2013-04-08 NOTE — Progress Notes (Signed)
Patient ID: Alex Meyers, male   DOB: 28-Jan-1941, 72 y.o.   MRN: [id number]   PROCEDURE NOTE: The patient presents with symptomatic (bleeding and prolapse) grade 2-3  hemorrhoids, requesting rubber band ligation of his/her hemorrhoidal disease.  All risks, benefits and alternative forms of therapy were described and informed consent was obtained. He takes clopidogrel due to CAD and prior coronary stent. It was held 5 days prior to today, cardiology advice given that this was acceptable.  In the Left Lateral Decubitus position anoscopic examination revealed grade 2 hemorrhoids in the RP and Grade 1 in LL>RA position(s).  The anorectum was pre-medicated with 0.125% NTG and 5% lidocaine. The decision was made to band the RP internal hemorrhoid, and the [facility] O'Regan System was used to perform band ligation without complication.  Digital anorectal examination was then performed to assure proper positioning of the band, and to adjust the banded tissue as required. I attempted to also band the LL column but he had discomfort on both attempts so I did not place a band.  The patient was discharged home without pain or other issues.  Dietary and behavioral recommendations were given and along with follow-up instructions.     The following adjunctive treatments were recommended:  None needed - advised to limit running Is to remain off clopidogrel until 04/15/13  The patient will call in 1 week for follow-up assessment and to determine timing of possible additional banding as required. Clopidogrel would need to be held again. No complications were encountered and the patient tolerated the procedure well.

## 2013-04-16 ENCOUNTER — Encounter: Payer: Self-pay | Admitting: Internal Medicine

## 2013-04-16 ENCOUNTER — Ambulatory Visit (INDEPENDENT_AMBULATORY_CARE_PROVIDER_SITE_OTHER): Payer: Self-pay | Admitting: Internal Medicine

## 2013-04-16 VITALS — BP 102/70 | HR 60 | Ht 72.0 in | Wt 177.2 lb

## 2013-04-16 DIAGNOSIS — K648 Other hemorrhoids: Secondary | ICD-10-CM

## 2013-04-16 NOTE — Patient Instructions (Signed)
You may restart your Plavix per Dr. Leone Payor.  Call us in 2 weeks and let us know how your doing.  I appreciate the opportunity to care for you.

## 2013-04-16 NOTE — Progress Notes (Signed)
  Subjective:    Patient ID: Alex Meyers, male    DOB: 1940-11-30, 72 y.o.   MRN: [id number]  HPI Patient returns after he had banding of his right posterior hemorrhoid column about a week ago. He had some bleeding in mild discomfort for a couple of days all minor and all resolved. Overall he thinks things are going well. He has not yet started back on his clopidogrel. He denies any significant constipation or straining, he is moving his bowels well without difficulty overall. Medications, allergies, past medical history, past surgical history, family history and social history are reviewed and updated in the EMR.  Review of Systems As per history of present illness    Objective:   Physical Exam He is in no acute distress   Assessment & Plan:   1. Internal hemorrhoids with Grade 2-3 prolapse and bleeding

## 2013-04-16 NOTE — Assessment & Plan Note (Signed)
Reports improvement. Restart clopidogrel. Call back in 2 weeks - we will decide about more banding then.

## 2013-04-26 ENCOUNTER — Ambulatory Visit (INDEPENDENT_AMBULATORY_CARE_PROVIDER_SITE_OTHER): Payer: Medicare Other | Admitting: Family Medicine

## 2013-04-26 ENCOUNTER — Encounter: Payer: Self-pay | Admitting: Family Medicine

## 2013-04-26 VITALS — BP 103/74 | Ht 72.0 in | Wt 175.0 lb

## 2013-04-26 DIAGNOSIS — S5001XA Contusion of right elbow, initial encounter: Secondary | ICD-10-CM

## 2013-04-26 DIAGNOSIS — S5000XA Contusion of unspecified elbow, initial encounter: Secondary | ICD-10-CM

## 2013-04-26 NOTE — Patient Instructions (Signed)
Straighten arm (not forcibly) 3 times an hour on average during waking hours. I would ICE 10-15 minutes a day (family pack frozen peas) for the next 5 days or so. PLEASE call if there are new issues or worsening issues. You can take one or two OTC aleve twice a day. I will see you next Friday at 8:30 here att he [facility]. Great to see you!

## 2013-04-30 ENCOUNTER — Telehealth: Payer: Self-pay | Admitting: Internal Medicine

## 2013-04-30 NOTE — Telephone Encounter (Signed)
Had another episode of rectal bleeding - desires repeat banding. We will arrange.

## 2013-05-01 NOTE — Telephone Encounter (Signed)
Patient is scheduled for hemorrhoid banding on 05/10/13.  He understands to hold plavix starting 05/05/13.

## 2013-05-01 NOTE — Progress Notes (Signed)
  Subjective:    Patient ID: Alex Meyers, male    DOB: 15-Aug-1940, 72 y.o.   MRN: [id number]  HPI Right elbow pain. He fell while running and landed on his elbow. Was seen at orthopedic surgeon's office where they did x-rays and said there was no fracture. He continues to have pain and swelling although he is able to extend his elbow little more than he could 2 days ago. No numbness in the hand.   Review of Systems Denies fever, sweats, chills. No erythema or warmth of the elbow on the right is noted.    Objective:   Physical Exam Vital signs are reviewed GENERAL: Well-developed male no acute distress ELBOW: Right. He lacks full extension by 30. Tender palpation of the radial head anteriorly. Distally he is neurovascularly intact. There is no erythema, mild swelling, no ecchymoses. ; Effusion seen. Increased operatic to the around the radial head and there is questionable small defect in the cortex the radial head.       Assessment & Plan:  #1. Elbow contusion. I'm not convinced he does not have a small radial head fracture. I think it would be most appropriate to put him in a body Helix elbow sleeve for compression, have him do extensive range of motion exercises and icing and see him back one week. He's in agreement with this plan.

## 2013-05-01 NOTE — Telephone Encounter (Signed)
He needs an appointment for hemorrhoid banding # 2 and needs to be off clopidogrel (Plavix) 5 days before. We already have clearance.

## 2013-05-03 ENCOUNTER — Telehealth: Payer: Self-pay | Admitting: Internal Medicine

## 2013-05-03 ENCOUNTER — Encounter: Payer: Self-pay | Admitting: Family Medicine

## 2013-05-03 ENCOUNTER — Ambulatory Visit (INDEPENDENT_AMBULATORY_CARE_PROVIDER_SITE_OTHER): Payer: Medicare Other | Admitting: Family Medicine

## 2013-05-03 VITALS — BP 103/70 | Ht 72.0 in | Wt 175.0 lb

## 2013-05-03 DIAGNOSIS — M25529 Pain in unspecified elbow: Secondary | ICD-10-CM

## 2013-05-03 DIAGNOSIS — M25521 Pain in right elbow: Secondary | ICD-10-CM

## 2013-05-03 NOTE — Telephone Encounter (Signed)
Patient needs to call me back

## 2013-05-03 NOTE — Telephone Encounter (Signed)
Patient is rescheduled for 05/16/13 1:30.  He is aware that he will hold his plavix starting 05/11/13

## 2013-05-03 NOTE — Telephone Encounter (Signed)
He has to check his court schedule

## 2013-05-10 ENCOUNTER — Encounter: Payer: Medicare Other | Admitting: Internal Medicine

## 2013-05-13 NOTE — Progress Notes (Signed)
  Subjective:    Patient ID: Alex Meyers, male    DOB: 1941/02/21, 72 y.o.   MRN: [id number]  HPI  F/u right elbow cpntusion. Much better. No new symptoms, little pain. No swelling or redness.  Review of Systems Pertinent review of systems: negative for fever or unusual weight change.     Objective:   Physical Exam  Vital signs reviewed. GENERAL: Well developed, well nourished, no acute distress ELBOW: right: extension to 170 degrees and supination  140 degrees. Mild ttp right over radial head (ventral approach). Distally NV intact.      Assessment & Plan:  Elbow contusion with suspicion for radial head fracture (occult). Has been compliant w exercises and well on way to healing. May f/u PRN.

## 2013-05-15 ENCOUNTER — Ambulatory Visit (INDEPENDENT_AMBULATORY_CARE_PROVIDER_SITE_OTHER): Payer: Medicare Other | Admitting: Sports Medicine

## 2013-05-15 ENCOUNTER — Encounter: Payer: Self-pay | Admitting: Sports Medicine

## 2013-05-15 ENCOUNTER — Ambulatory Visit
Admission: RE | Admit: 2013-05-15 | Discharge: 2013-05-15 | Disposition: A | Discharge status: Home / Self Care | Payer: Medicare Other | Source: Ambulatory Visit | Attending: Sports Medicine | Admitting: Sports Medicine

## 2013-05-15 VITALS — BP 105/79 | Ht 72.0 in | Wt 175.0 lb

## 2013-05-15 DIAGNOSIS — M25529 Pain in unspecified elbow: Secondary | ICD-10-CM

## 2013-05-15 DIAGNOSIS — S52133A Displaced fracture of neck of unspecified radius, initial encounter for closed fracture: Secondary | ICD-10-CM

## 2013-05-15 DIAGNOSIS — S52131A Displaced fracture of neck of right radius, initial encounter for closed fracture: Secondary | ICD-10-CM

## 2013-05-15 DIAGNOSIS — M25521 Pain in right elbow: Secondary | ICD-10-CM

## 2013-05-15 NOTE — Progress Notes (Signed)
  Subjective:    Patient ID: Alex Meyers, male    DOB: [birth year], 72 y.o.   MRN: [id number]  HPI Alex Meyers comes in today with persistent right elbow pain. He fell about 3 weeks ago onto the right elbow. Immediate pain and swelling. Initial x-rays done at a local orthopedist office were negative per the patient's report. He was then seen in our clinic by Dr. Jennette Kettle. An ultrasound was suspicious for a possible proximal radius. He was treated with a body heel is compression sleeve and had significant improvement in his symptoms at followup one week later. However, over the past couple of weeks he has had a couple of episodes of excruciating elbow pain. Each episode was associated with weight lifting. He localizes the pain to the lateral elbow with radiating discomfort into the proximal forearm. Denies problems with this elbow in the past. He is right-hand dominant.    Review of Systems     Objective:   Physical Exam Well-developed, well-nourished. No acute distress. Awake alert and oriented x3  Right elbow: Patient demonstrates approximately 30 extension lag with flexion to 130. Supination is limited to 50. Full pronation. There is tenderness to palpation directly over the radial head. No appreciable effusion. No soft tissue swelling. No tenderness to palpation over the lateral epicondyle or medial epicondyle. Good grip strength. Neurovascularly intact distally.  MSK ultrasound of the right elbow: Limited images of the lateral elbow were obtained. There is an area of cortical irregularity at the radial neck consistent with a nondisplaced fracture here. Small joint effusion is also seen. No cortical irregularity over the lateral epicondyle. Extensor mechanism is intact.       Assessment & Plan:  Right elbow pain secondary to nondisplaced radial neck fracture  X-rays of the right elbow were obtained to confirm the fracture which was suspected on ultrasound. X-ray shows a nondisplaced, impacted  radial neck fracture with slight extension to the lateral most aspect of the radial head. Patient is instructed to avoid heavy lifting or pushing for at least the next 3-4 weeks. Followup with me at the end of that time for repeat x-rays and repeat ultrasound. In the meantime, he can work on his elbow range of motion. Call with questions or concerns in the interim.

## 2013-05-16 ENCOUNTER — Encounter: Payer: Medicare Other | Admitting: Internal Medicine

## 2013-05-26 ENCOUNTER — Other Ambulatory Visit: Payer: Self-pay | Admitting: Cardiovascular Disease

## 2013-06-06 ENCOUNTER — Ambulatory Visit (INDEPENDENT_AMBULATORY_CARE_PROVIDER_SITE_OTHER): Payer: Medicare Other | Admitting: Sports Medicine

## 2013-06-06 ENCOUNTER — Encounter: Payer: Self-pay | Admitting: Sports Medicine

## 2013-06-06 VITALS — BP 108/60 | Ht 72.0 in | Wt 175.0 lb

## 2013-06-06 DIAGNOSIS — M25521 Pain in right elbow: Secondary | ICD-10-CM

## 2013-06-06 DIAGNOSIS — M25531 Pain in right wrist: Secondary | ICD-10-CM

## 2013-06-06 DIAGNOSIS — M25539 Pain in unspecified wrist: Secondary | ICD-10-CM

## 2013-06-06 DIAGNOSIS — M25529 Pain in unspecified elbow: Secondary | ICD-10-CM

## 2013-06-07 NOTE — Progress Notes (Signed)
   Subjective:    Patient ID: Alex Meyers, male    DOB: 1940-10-27, 72 y.o.   MRN: [id number]  HPI Patient comes in today for followup on a nondisplaced radial neck fracture of the right elbow. Overall, he is feeling better. However, he has been trying to do a little too much activity. He tried to play tennis a few days ago and had pain that lasted for a few days afterwards. Pain is still primarily in the elbow but he is complaining of right wrist pain as well. He does not recall any swelling in the right wrist at the time of his initial injury. Pain is localized along the radial aspect of the wrist. Does not appear to be interfering with his activities however.    Review of Systems     Objective:   Physical Exam Well-developed, no acute distress. Sitting comfortable in exam room  Right elbow: Patient has regained nearly full motion in the right elbow. There is no effusion and no soft tissue swelling. There is still tenderness to palpation over the fracture site.  Right wrist: Full range of motion. No soft tissue swelling. No obvious effusion. There is tenderness to palpation along the distal radius as well as in the anatomic snuff box. No tenderness along the ulnar aspect of the wrist. Neurovascularly intact distally.       Assessment & Plan:  1. Healing nondisplaced radial neck fracture, right elbow 2. Right wrist pain-rule out distal radius fracture/scaphoid fracture  I explained to Ayaan that he is not yet ready to pursue aggressive activities such as tennis. I would like to repaet an x-ray of his elbow to document the degree of healing. I would also like to get an x-ray of his right wrist including an AP, lateral, and scaphoid view although fracture here is unlikely. Patient will followup with me in 4 weeks.

## 2013-06-08 ENCOUNTER — Other Ambulatory Visit: Payer: Self-pay | Admitting: Cardiovascular Disease

## 2013-06-19 ENCOUNTER — Ambulatory Visit (INDEPENDENT_AMBULATORY_CARE_PROVIDER_SITE_OTHER): Payer: Medicare Other | Admitting: Internal Medicine

## 2013-06-19 ENCOUNTER — Encounter: Payer: Self-pay | Admitting: Internal Medicine

## 2013-06-19 VITALS — BP 120/70 | HR 72 | Ht 72.0 in | Wt 175.0 lb

## 2013-06-19 DIAGNOSIS — K648 Other hemorrhoids: Secondary | ICD-10-CM

## 2013-06-19 NOTE — Patient Instructions (Addendum)
HEMORRHOID BANDING PROCEDURE    FOLLOW-UP CARE   1. The procedure you have had should have been relatively painless since the banding of the area involved does not have nerve endings and there is no pain sensation.  The rubber band cuts off the blood supply to the hemorrhoid and the band may fall off as soon as 48 hours after the banding (the band may occasionally be seen in the toilet bowl following a bowel movement). You may notice a temporary feeling of fullness in the rectum which should respond adequately to plain Tylenol or Motrin.  2. Following the banding, avoid strenuous exercise that evening and resume full activity the next day.  A sitz bath (soaking in a warm tub) or bidet is soothing, and can be useful for cleansing the area after bowel movements.     3. To avoid constipation, take two tablespoons of natural wheat bran, natural oat bran, flax, Benefiber or any over the counter fiber supplement and increase your water intake to 7-8 glasses daily.    4. Unless you have been prescribed anorectal medication, do not put anything inside your rectum for two weeks: No suppositories, enemas, fingers, etc.  5. Occasionally, you may have more bleeding than usual after the banding procedure.  This is often from the untreated hemorrhoids rather than the treated one.  Don't be concerned if there is a tablespoon or so of blood.  If there is more blood than this, lie flat with your bottom higher than your head and apply an ice pack to the area. If the bleeding does not stop within a half an hour or if you feel faint, call our office at [phone] or go to the emergency room.  6. Problems are not common; however, if there is a substantial amount of bleeding, severe pain, chills, fever or difficulty passing urine (very rare) or other problems, you should call us at [phone] or report to the nearest emergency room.  7. Do not stay seated continuously for more than 2-3 hours for a day or two  after the procedure.  Tighten your buttock muscles 10-15 times every two hours and take 10-15 deep breaths every 1-2 hours.  Do not spend more than a few minutes on the toilet if you cannot empty your bowel; instead re-visit the toilet at a later time.    Restart your plavix on December 24th and call us in 2 weeks with an update.   I appreciate the opportunity to care for you.

## 2013-06-19 NOTE — Assessment & Plan Note (Signed)
LL column ligated Stay off clopidogrel until 12/24 He is to contact us with an update in 2 weeks re: next steps, repeat ligation if any

## 2013-06-19 NOTE — Progress Notes (Signed)
Patient ID: Alex Meyers, male   DOB: 1941/03/22, 72 y.o.   MRN: [id number]         PROCEDURE NOTE: The patient presents with symptomatic grade 2-3  hemorrhoids, requesting rubber band ligation of his/her hemorrhoidal disease.  All risks, benefits and alternative forms of therapy were described and informed consent was obtained. He previously underwent banding of the right posterior hemorrhoid pile. He achieved improvement in his symptoms with much less rectal bleeding but has had some mild less frequent rectal bleeding and desires repeat treatment. He has held his clopidogrel 5 days prior to this procedure. Previous communication with cardiology has indicated this is acceptable.  In the Left Lateral Decubitus position anoscopic examination revealed grade 2 hemorrhoids in the left lateral and right posterior positions.Grade 1 internal hemorrhoid in the right anterior position   The anorectum was pre-medicated with 0.125% nitroglycerin and 5% lidocaine topical The decision was made to band the left lateral internal hemorrhoid, and the [facility] O'Regan System was used to perform band ligation without complication.  Digital anorectal examination was then performed to assure proper positioning of the band, and to adjust the banded tissue as required.  The patient was discharged home without pain or other issues.  Dietary and behavioral recommendations were given and along with follow-up instructions.     The patient will contact me in 2 weeks to discuss followup and possible additional banding as required.  He is to continue to hold clopidogrel and resume it in one week. He is to contact me at that time if he is having bleeding problems. No complications were encountered and the patient tolerated the procedure well.  CC: Illene Regulus, MD and Beverley Fiedler.D.

## 2013-07-28 ENCOUNTER — Other Ambulatory Visit: Payer: Self-pay | Admitting: Internal Medicine

## 2013-07-30 NOTE — Telephone Encounter (Signed)
Zolpidem has been called to pharmacy  

## 2013-08-30 ENCOUNTER — Telehealth: Payer: Self-pay | Admitting: Internal Medicine

## 2013-08-30 NOTE — Telephone Encounter (Signed)
Patient came into the office c/o severe constipation.  He actually had a BM while he was here waiting to speak with me and no longer needs [place].  I did have a talk with him that he needs to start Miralax 1-3 times a day to prevent constipation.  He is aware that he also needs to increase the amount of water he drinks in his diet.

## 2013-09-02 ENCOUNTER — Telehealth: Payer: Self-pay | Admitting: Internal Medicine

## 2013-09-02 NOTE — Telephone Encounter (Signed)
Patient left me a message desiring repeat hemorrhoid banding Please call him and arrange this - hold clopidogrel 5 days before please

## 2013-09-03 NOTE — Telephone Encounter (Signed)
Spoke to patient and put him on for banding appointment 09/12/13 at 9:30AM.  Per Dr. Carlean Purl informed to hold his generic Plavix 5 days prior to procedure, he verbalized understanding.  We got clearance for this prior to his first banding.

## 2013-09-12 ENCOUNTER — Encounter: Payer: Self-pay | Admitting: Internal Medicine

## 2013-09-12 ENCOUNTER — Ambulatory Visit (INDEPENDENT_AMBULATORY_CARE_PROVIDER_SITE_OTHER): Payer: Medicare Other | Admitting: Internal Medicine

## 2013-09-12 VITALS — BP 110/70 | HR 72

## 2013-09-12 DIAGNOSIS — K648 Other hemorrhoids: Secondary | ICD-10-CM

## 2013-09-12 NOTE — Patient Instructions (Signed)
HEMORRHOID BANDING PROCEDURE    FOLLOW-UP CARE   1. The procedure you have had should have been relatively painless since the banding of the area involved does not have nerve endings and there is no pain sensation.  The rubber band cuts off the blood supply to the hemorrhoid and the band may fall off as soon as 48 hours after the banding (the band may occasionally be seen in the toilet bowl following a bowel movement). You may notice a temporary feeling of fullness in the rectum which should respond adequately to plain Tylenol or Motrin.  2. Following the banding, avoid strenuous exercise that evening and resume full activity the next day.  A sitz bath (soaking in a warm tub) or bidet is soothing, and can be useful for cleansing the area after bowel movements.     3. To avoid constipation, take two tablespoons of natural wheat bran, natural oat bran, flax, Benefiber or any over the counter fiber supplement and increase your water intake to 7-8 glasses daily.    4. Unless you have been prescribed anorectal medication, do not put anything inside your rectum for two weeks: No suppositories, enemas, fingers, etc.  5. Occasionally, you may have more bleeding than usual after the banding procedure.  This is often from the untreated hemorrhoids rather than the treated one.  Don't be concerned if there is a tablespoon or so of blood.  If there is more blood than this, lie flat with your bottom higher than your head and apply an ice pack to the area. If the bleeding does not stop within a half an hour or if you feel faint, call our office at [phone] or go to the emergency room.  6. Problems are not common; however, if there is a substantial amount of bleeding, severe pain, chills, fever or difficulty passing urine (very rare) or other problems, you should call us at [phone] or report to the nearest emergency room.  7. Do not stay seated continuously for more than 2-3 hours for a day or two  after the procedure.  Tighten your buttock muscles 10-15 times every two hours and take 10-15 deep breaths every 1-2 hours.  Do not spend more than a few minutes on the toilet if you cannot empty your bowel; instead re-visit the toilet at a later time.    Stay off your Plavix, re-start it March 19th.   Take your Miralax as needed and come back and see [place] as needed.   I appreciate the opportunity to care for you.

## 2013-09-12 NOTE — Progress Notes (Signed)
Patient ID: Alex Meyers, male   DOB: 1941/05/19, 73 y.o.   MRN: [id number]       PROCEDURE NOTE: The patient presents with symptomatic grade 2-3 hemorrhoids, requesting rubber band ligation of his/her hemorrhoidal disease.  All risks, benefits and alternative forms of therapy were described and informed consent was obtained. His clopidogrel was held 5 days before. He has slight rectal bleeding occasionally, s/p LL and RP banding. Has had an urgent constipation issue last month that resolved. He was frustrated about inability to access me/other help  by walking in without an appointment.  The decision was made to band the RA internal hemorrhoid, and the [place] was used to perform band ligation without complication.  Digital anorectal examination was then performed to assure proper positioning of the band, and to adjust the banded tissue as required.  The patient was discharged home without pain or other issues.  Dietary and behavioral recommendations were given and along with follow-up instructions.     The following adjunctive treatments were recommended:  MiraLax prn constipation  The patient will return in as needed for  follow-up and possible additional banding as required. No complications were encountered and the patient tolerated the procedure well. He may resume clopidogrel in 1 week.  Internal hemorrhoids with Grade 2-3 prolapse and bleeding RA ligated w/o dfficulty Stay off clopidogrel - restart 3/19 Continue MiraLax F/u prn    Cc: Zenovia Jarred, MD

## 2013-09-12 NOTE — Assessment & Plan Note (Addendum)
RA ligated w/o dfficulty Stay off clopidogrel - restart 3/19 Continue MiraLax F/u prn

## 2013-11-15 ENCOUNTER — Ambulatory Visit (INDEPENDENT_AMBULATORY_CARE_PROVIDER_SITE_OTHER): Payer: Medicare Other | Admitting: Cardiovascular Disease

## 2013-11-15 ENCOUNTER — Encounter: Payer: Self-pay | Admitting: Cardiovascular Disease

## 2013-11-15 VITALS — BP 124/70 | HR 68 | Wt 176.0 lb

## 2013-11-15 DIAGNOSIS — I2510 Atherosclerotic heart disease of native coronary artery without angina pectoris: Secondary | ICD-10-CM

## 2013-11-15 DIAGNOSIS — I10 Essential (primary) hypertension: Secondary | ICD-10-CM

## 2013-11-15 DIAGNOSIS — E785 Hyperlipidemia, unspecified: Secondary | ICD-10-CM

## 2013-11-15 NOTE — Progress Notes (Signed)
HPI:   73 year old gentleman presenting for followup evaluation. The patient has coronary artery disease and he underwent stenting of the LAD and diagonal branches in 2000 and for treatment of exertional angina and an abnormal nuclear scan. He presents today for followup.   His last treadmill study one year ago demonstrated excellent exercise tolerance with no chest pain or EKG changes at high level exertion. He was able to exercise 16 minutes according to the Bruce protocol with a work load of 82.5 metabolic equivalents.  Last lipids 01/01/2013: Lipid Panel     Component Value Date/Time   CHOL 145 01/01/2013 1638   TRIG 67.0 01/01/2013 1638   HDL 82.00 01/01/2013 1638   CHOLHDL 2 01/01/2013 1638   VLDL 13.4 01/01/2013 1638   LDLCALC 50 01/01/2013 1638   The patient has had no chest pain or pressure with exertion. He denies dyspnea, orthopnea, or PND. He describes brief episodes of lightheadedness and near syncope. These last for just a few seconds. There is no clear postural component. He has no associated palpitations. No other complaints today.     Outpatient Encounter Prescriptions as of 11/15/2013  Medication Sig  . ACZONE 5 % topical gel   . aspirin 81 MG tablet Take 1 tablet (81 mg total) by mouth daily.  . Canagliflozin (INVOKANA) 300 MG TABS Take 1 tablet by mouth daily.  . clopidogrel (PLAVIX) 75 MG tablet TAKE 1 TABLET EVERY DAY  . NON FORMULARY Sleep med prn  . pioglitazone (ACTOS) 30 MG tablet Take 45 mg by mouth 2 (two) times daily.  . Polyethylene Glycol 3350 (MIRALAX PO) Take by mouth as needed (for constipation).  . rosuvastatin (CRESTOR) 20 MG tablet Take 1 tablet (20 mg total) by mouth daily.  . saxagliptin HCl (ONGLYZA) 5 MG TABS tablet Take 1 tablet (5 mg total) by mouth daily.  . sildenafil (VIAGRA) 100 MG tablet   . [DISCONTINUED] zolpidem (AMBIEN) 10 MG tablet TAKE 1 TABLET AT BEDTIME AS NEEDED    No Known Allergies  Past Medical History  Diagnosis Date  .  Diabetes mellitus   . Coronary artery disease     s//p DES LAD and Dx1 01/2009  . Multiple thyroid nodules   . Personal history of colonic polyps 08/09/2002    ADENOMATOUS POLYP  . Hyperlipidemia     pt denies  . Hypertension     pt. denies  . Hx of colonic polyps 08/2010    Dr. Hilarie Fredrickson  . Internal hemorrhoids with Grade 2-3 prolapse and bleeding 09/25/2012    Diagnosed by Dr. Linda Hedges via anoscopy     ROS: Negative except as per HPI  BP 124/70  Pulse 68  Wt 79.833 kg (176 lb)  PHYSICAL EXAM: Pt is alert and oriented, physically fit gentleman in NAD HEENT: normal Neck: JVP - normal, carotids 2+= without bruits Lungs: CTA bilaterally CV: RRR without murmur or gallop Abd: soft, NT, Positive BS, no hepatomegaly Ext: no C/C/E, distal pulses intact and equal Skin: warm/dry no rash  EKG:  Normal sinus rhythm 68 beats per minute, left axis deviation.  ASSESSMENT AND PLAN: 1. Coronary artery disease, native vessel. The patient is stable without anginal symptoms. He is tolerating long-term dual antiplatelet therapy with aspirin and Plavix. No changes were made to his medical regimen today.  2. Hyperlipidemia. Lipids as above with an excellent HDL of 82 and LDL of 50  3. Pre-syncope. Very brief episodes noted. There is no postural component. If symptoms progress  he will call back to set up a 24-hour Holter monitor. I think the likelihood of arrhythmia is low. This could potentially be related to episodes of hypotension. I asked him to push fluids and judiciously liberalize salt.  Sherren Mocha 11/15/2013 8:57 AM

## 2013-11-15 NOTE — Patient Instructions (Signed)
Your physician has requested that you have an exercise tolerance test in 1 YEAR with Dr Burt Knack.  You will receive a reminder letter in the mail two months in advance. If you don't receive a letter, please call our office to schedule the follow-up appointment.  Your physician recommends that you continue on your current medications as directed. Please refer to the Current Medication list given to you today.

## 2014-01-13 ENCOUNTER — Other Ambulatory Visit: Payer: Self-pay | Admitting: Cardiovascular Disease

## 2014-03-03 ENCOUNTER — Other Ambulatory Visit: Payer: Self-pay | Admitting: *Deleted

## 2014-04-08 ENCOUNTER — Encounter: Payer: Self-pay | Admitting: Internal Medicine

## 2014-05-14 LAB — HM DIABETES EYE EXAM

## 2014-06-26 ENCOUNTER — Emergency Department (HOSPITAL_COMMUNITY)
Admission: EM | Admit: 2014-06-26 | Discharge: 2014-06-26 | Disposition: A | Discharge status: Home / Self Care | Payer: Medicare Other | Attending: Emergency Medicine | Admitting: Emergency Medicine

## 2014-06-26 ENCOUNTER — Emergency Department (HOSPITAL_COMMUNITY): Payer: Medicare Other

## 2014-06-26 ENCOUNTER — Encounter (HOSPITAL_COMMUNITY): Payer: Self-pay | Admitting: Emergency Medicine

## 2014-06-26 DIAGNOSIS — Z7902 Long term (current) use of antithrombotics/antiplatelets: Secondary | ICD-10-CM | POA: Insufficient documentation

## 2014-06-26 DIAGNOSIS — R109 Unspecified abdominal pain: Secondary | ICD-10-CM

## 2014-06-26 DIAGNOSIS — E785 Hyperlipidemia, unspecified: Secondary | ICD-10-CM | POA: Insufficient documentation

## 2014-06-26 DIAGNOSIS — Z86010 Personal history of colonic polyps: Secondary | ICD-10-CM | POA: Diagnosis not present

## 2014-06-26 DIAGNOSIS — K5900 Constipation, unspecified: Secondary | ICD-10-CM

## 2014-06-26 DIAGNOSIS — E119 Type 2 diabetes mellitus without complications: Secondary | ICD-10-CM | POA: Diagnosis not present

## 2014-06-26 DIAGNOSIS — I2510 Atherosclerotic heart disease of native coronary artery without angina pectoris: Secondary | ICD-10-CM | POA: Diagnosis not present

## 2014-06-26 DIAGNOSIS — Z7982 Long term (current) use of aspirin: Secondary | ICD-10-CM | POA: Diagnosis not present

## 2014-06-26 DIAGNOSIS — I10 Essential (primary) hypertension: Secondary | ICD-10-CM | POA: Diagnosis not present

## 2014-06-26 DIAGNOSIS — Z79899 Other long term (current) drug therapy: Secondary | ICD-10-CM | POA: Insufficient documentation

## 2014-06-26 LAB — CBC WITH DIFFERENTIAL/PLATELET
Basophils Absolute: 0 10*3/uL (ref 0.0–0.1)
Basophils Relative: 0 % (ref 0–1)
EOS PCT: 0 % (ref 0–5)
Eosinophils Absolute: 0 10*3/uL (ref 0.0–0.7)
HCT: 43.8 % (ref 39.0–52.0)
HEMOGLOBIN: 14.3 g/dL (ref 13.0–17.0)
LYMPHS ABS: 0.9 10*3/uL (ref 0.7–4.0)
LYMPHS PCT: 9 % — AB (ref 12–46)
MCH: 29.4 pg (ref 26.0–34.0)
MCHC: 32.6 g/dL (ref 30.0–36.0)
MCV: 89.9 fL (ref 78.0–100.0)
MONO ABS: 0.5 10*3/uL (ref 0.1–1.0)
Monocytes Relative: 5 % (ref 3–12)
Neutro Abs: 9.1 10*3/uL — ABNORMAL HIGH (ref 1.7–7.7)
Neutrophils Relative %: 86 % — ABNORMAL HIGH (ref 43–77)
PLATELETS: 135 10*3/uL — AB (ref 150–400)
RBC: 4.87 MIL/uL (ref 4.22–5.81)
RDW: 14.7 % (ref 11.5–15.5)
WBC: 10.5 10*3/uL (ref 4.0–10.5)

## 2014-06-26 LAB — BASIC METABOLIC PANEL
Anion gap: 9 (ref 5–15)
BUN: 15 mg/dL (ref 6–23)
CALCIUM: 9.2 mg/dL (ref 8.4–10.5)
CO2: 24 mmol/L (ref 19–32)
Chloride: 104 mEq/L (ref 96–112)
Creatinine, Ser: 1.1 mg/dL (ref 0.50–1.35)
GFR calc Af Amer: 76 mL/min — ABNORMAL LOW (ref >90)
GFR calc non Af Amer: 66 mL/min — ABNORMAL LOW (ref >90)
Glucose, Bld: 150 mg/dL — ABNORMAL HIGH (ref 70–99)
Potassium: 3.8 mmol/L (ref 3.5–5.1)
SODIUM: 137 mmol/L (ref 135–145)

## 2014-06-26 LAB — URINALYSIS, ROUTINE W REFLEX MICROSCOPIC
BILIRUBIN URINE: NEGATIVE
Glucose, UA: >1000 mg/dL — AB
Ketones, ur: NEGATIVE mg/dL
Leukocytes, UA: NEGATIVE
Nitrite: NEGATIVE
Protein, ur: 100 mg/dL — AB
Specific Gravity, Urine: 1.033 — ABNORMAL HIGH (ref 1.005–1.030)
UROBILINOGEN UA: 0.2 mg/dL (ref 0.0–1.0)
pH: 5 (ref 5.0–8.0)

## 2014-06-26 LAB — URINE MICROSCOPIC-ADD ON

## 2014-06-26 NOTE — ED Notes (Signed)
Pt ambulated to restroom and was able to have a bowel movement. Pt states that he feels "much better". MD made aware

## 2014-06-26 NOTE — ED Notes (Signed)
Pt remains in radiology 

## 2014-06-26 NOTE — ED Notes (Signed)
Pt c/o abd pain and rectal pain; pt sts thinks he is constipated; pt sts hx of similar in past

## 2014-06-26 NOTE — ED Provider Notes (Signed)
CSN: [id number]     Arrival date & time 06/26/14  1244 History   First MD Initiated Contact with Patient 06/26/14 1349     Chief Complaint  Patient presents with  . Constipation     (Consider location/radiation/quality/duration/timing/severity/associated sxs/prior Treatment) Patient is a 73 y.o. male presenting with constipation. The history is provided by the patient.  Constipation Severity:  Mild Time since last bowel movement:  1 day Timing:  Constant Progression:  Unchanged Chronicity:  New Stool description:  None produced Unusual stool frequency:  Daily Relieved by:  Nothing Worsened by:  Nothing tried Ineffective treatments:  None tried Associated symptoms: abdominal pain   Associated symptoms: no back pain, no diarrhea, no dysuria, no fever, no nausea and no vomiting   Associated symptoms comment:  Dec uop Abdominal pain:    Location:  Suprapubic   Quality:  Dull   Severity:  Mild   Past Medical History  Diagnosis Date  . Diabetes mellitus   . Coronary artery disease     s//p DES LAD and Dx1 01/2009  . Multiple thyroid nodules   . Personal history of colonic polyps 08/09/2002    ADENOMATOUS POLYP  . Hyperlipidemia     pt denies  . Hypertension     pt. denies  . Hx of colonic polyps 08/2010    Dr. Hilarie Fredrickson  . Internal hemorrhoids with Grade 2-3 prolapse and bleeding 09/25/2012    Diagnosed by Dr. Linda Hedges via anoscopy    Past Surgical History  Procedure Laterality Date  . Colonoscopy  multiple  . Coronary stent placement  01/22/2009    right side  . Wrist surgery      right side   Family History  Problem Relation Age of Onset  . Heart failure Mother   . Coronary artery disease Mother   . Bladder Cancer Father 77  . Breast cancer Daughter   . Colon cancer Neg Hx    History  Substance Use Topics  . Smoking status: Never Smoker   . Smokeless tobacco: Never Used  . Alcohol Use: Yes     Comment: 7. drinks per week    Review of Systems  Constitutional:  Negative for fever, activity change, appetite change and fatigue.  HENT: Negative for congestion, facial swelling, rhinorrhea and trouble swallowing.   Eyes: Negative for photophobia and pain.  Respiratory: Negative for cough, chest tightness and shortness of breath.   Cardiovascular: Negative for chest pain and leg swelling.  Gastrointestinal: Positive for abdominal pain and constipation. Negative for nausea, vomiting and diarrhea.  Endocrine: Negative for polydipsia and polyuria.  Genitourinary: Positive for difficulty urinating. Negative for dysuria, urgency and decreased urine volume.  Musculoskeletal: Negative for back pain and gait problem.  Skin: Negative for color change, rash and wound.  Allergic/Immunologic: Negative for immunocompromised state.  Neurological: Negative for dizziness, facial asymmetry, speech difficulty, weakness, numbness and headaches.  Psychiatric/Behavioral: Negative for confusion, decreased concentration and agitation.      Allergies  Review of patient's allergies indicates no known allergies.  Home Medications   Prior to Admission medications   Medication Sig Start Date End Date Taking? Authorizing Provider  ACZONE 5 % topical gel Apply 1 application topically daily as needed.  03/29/13  Yes Historical Provider, MD  aspirin 81 MG tablet Take 1 tablet (81 mg total) by mouth daily. 12/28/10  Yes Blane Ohara, MD  Canagliflozin [facility]) 300 MG TABS Take 1 tablet by mouth daily.   Yes Historical Provider, MD  clopidogrel (PLAVIX) 75 MG tablet TAKE 1 TABLET EVERY DAY 10/31/12  Yes Blane Ohara, MD  metFORMIN (GLUCOPHAGE-XR) 500 MG 24 hr tablet Take 1 tablet by mouth at bedtime. 06/06/14  Yes Historical Provider, MD  NON FORMULARY Take 2 tablets by mouth at bedtime. Sleep med prn   Yes Historical Provider, MD  pioglitazone (ACTOS) 45 MG tablet Take 45 mg by mouth daily. 06/06/14  Yes Historical Provider, MD  Polyethylene Glycol 3350 (MIRALAX PO) Take by  mouth as needed (for constipation).   Yes Historical Provider, MD  rosuvastatin (CRESTOR) 20 MG tablet Take 1 tablet (20 mg total) by mouth daily. 04/22/11  Yes Blane Ohara, MD  saxagliptin HCl (ONGLYZA) 5 MG TABS tablet Take 1 tablet (5 mg total) by mouth daily. 03/26/12  Yes Neena Rhymes, MD  sildenafil (VIAGRA) 100 MG tablet Take 100 mg by mouth as needed.  09/13/12  Yes Neena Rhymes, MD  clopidogrel (PLAVIX) 75 MG tablet TAKE 1 TABLET BY MOUTH EVERY DAY Patient not taking: Reported on 06/26/2014 01/13/14   Blane Ohara, MD   BP 144/80 mmHg  Pulse 64  SpO2 100% Physical Exam  Constitutional: He is oriented to person, place, and time. He appears well-developed and well-nourished. No distress.  HENT:  Head: Normocephalic and atraumatic.  Mouth/Throat: No oropharyngeal exudate.  Eyes: Pupils are equal, round, and reactive to light.  Neck: Normal range of motion. Neck supple.  Cardiovascular: Normal rate, regular rhythm and normal heart sounds.  Exam reveals no gallop and no friction rub.   No murmur heard. Pulmonary/Chest: Effort normal and breath sounds normal. No respiratory distress. He has no wheezes. He has no rales.  Abdominal: Soft. Bowel sounds are normal. He exhibits no distension and no mass. There is tenderness in the suprapubic area. There is no rebound and no guarding.  Musculoskeletal: Normal range of motion. He exhibits no edema or tenderness.  Neurological: He is alert and oriented to person, place, and time.  Skin: Skin is warm and dry.  Psychiatric: He has a normal mood and affect.    ED Course  Procedures (including critical care time) Labs Review Labs Reviewed  URINALYSIS, ROUTINE W REFLEX MICROSCOPIC - Abnormal; Notable for the following:    APPearance TURBID (*)    Specific Gravity, Urine 1.033 (*)    Glucose, UA >1000 (*)    Hgb urine dipstick LARGE (*)    Protein, ur 100 (*)    All other components within normal limits  CBC WITH DIFFERENTIAL  - Abnormal; Notable for the following:    Platelets 135 (*)    Neutrophils Relative % 86 (*)    Neutro Abs 9.1 (*)    Lymphocytes Relative 9 (*)    All other components within normal limits  BASIC METABOLIC PANEL - Abnormal; Notable for the following:    Glucose, Bld 150 (*)    GFR calc non Af Amer 66 (*)    GFR calc Af Amer 76 (*)    All other components within normal limits  URINE MICROSCOPIC-ADD ON - Abnormal; Notable for the following:    Bacteria, UA MANY (*)    All other components within normal limits  URINE CULTURE    Imaging Review Dg Abd Acute W/chest  06/26/2014   CLINICAL DATA:  Constipated for 24 hr  EXAM: ACUTE ABDOMEN SERIES (ABDOMEN 2 VIEW & CHEST 1 VIEW)  COMPARISON:  None.  FINDINGS: Large amount of stool in the descending and sigmoid colon.  There is no evidence of dilated bowel loops or free intraperitoneal air. No radiopaque calculi or other significant radiographic abnormality is seen. Heart size and mediastinal contours are within normal limits. Both lungs are clear.  IMPRESSION: Large amount of stool in the descending and sigmoid colon. No bowel obstruction.   Electronically Signed   By: Kathreen Devoid   On: 06/26/2014 13:56     EKG Interpretation None      MDM   Final diagnoses:  Constipation, unspecified constipation type    Pt is a 73 y.o. male with Pmhx as above who presents with constipation and dull suprapubic pain. No n/v, fever, chills. She had dec urine outpt today.  On PE, VSS, pt in NAD.  He has mild suprapubic tenderness. I have offered rectal exma, pt asked to urinate first and then was able to have large bowel movement. Exam after BM benign, suprapubic tenderness resolved. UA not infected,  CBC, BMP unremarkable.   Will dc home with inc miralax dosing and have requested close outpt PCP f/u.    [place] evaluation in the Emergency Department is complete. It has been determined that no acute conditions requiring further emergency  intervention are present at this time. The patient/guardian have been advised of the diagnosis and plan. We have discussed signs and symptoms that warrant return to the ED, such as changes or worsening in symptoms, worsening pain, fever, inability to tolerate PO.       Ernestina Patches, MD 06/26/14 2032

## 2014-06-26 NOTE — Discharge Instructions (Signed)

## 2014-06-26 NOTE — ED Notes (Signed)
Pt refused to let me get a last set of vitals, " I am dressed and ready to go"

## 2014-06-26 NOTE — ED Notes (Signed)
MD at bedside. 

## 2014-06-26 NOTE — ED Notes (Signed)
PT states, " I took this white powder at home.  I don't know the name of it is but, I took two doses. It was about 1100"

## 2014-06-28 LAB — URINE CULTURE
Colony Count: NO GROWTH
Culture: NO GROWTH

## 2014-07-01 ENCOUNTER — Other Ambulatory Visit: Payer: Self-pay | Admitting: Cardiovascular Disease

## 2014-08-25 ENCOUNTER — Telehealth: Payer: Self-pay | Admitting: Internal Medicine

## 2014-08-25 NOTE — Telephone Encounter (Signed)
Alex Meyers was a patient of Dr. Linda Hedges.  He is requesting to see you.  Is that okay?

## 2014-08-25 NOTE — Telephone Encounter (Signed)
yes

## 2014-09-01 ENCOUNTER — Ambulatory Visit (INDEPENDENT_AMBULATORY_CARE_PROVIDER_SITE_OTHER): Payer: Medicare Other | Admitting: Internal Medicine

## 2014-09-01 ENCOUNTER — Encounter: Payer: Self-pay | Admitting: Internal Medicine

## 2014-09-01 ENCOUNTER — Other Ambulatory Visit (INDEPENDENT_AMBULATORY_CARE_PROVIDER_SITE_OTHER): Payer: Medicare Other

## 2014-09-01 VITALS — BP 110/64 | HR 62 | Temp 98.7°F | Resp 16 | Ht 72.0 in | Wt 174.0 lb

## 2014-09-01 DIAGNOSIS — E785 Hyperlipidemia, unspecified: Secondary | ICD-10-CM | POA: Diagnosis not present

## 2014-09-01 DIAGNOSIS — Z0000 Encounter for general adult medical examination without abnormal findings: Secondary | ICD-10-CM

## 2014-09-01 DIAGNOSIS — I10 Essential (primary) hypertension: Secondary | ICD-10-CM

## 2014-09-01 DIAGNOSIS — E041 Nontoxic single thyroid nodule: Secondary | ICD-10-CM

## 2014-09-01 DIAGNOSIS — E118 Type 2 diabetes mellitus with unspecified complications: Secondary | ICD-10-CM

## 2014-09-01 DIAGNOSIS — N400 Enlarged prostate without lower urinary tract symptoms: Secondary | ICD-10-CM

## 2014-09-01 DIAGNOSIS — I2510 Atherosclerotic heart disease of native coronary artery without angina pectoris: Secondary | ICD-10-CM | POA: Diagnosis not present

## 2014-09-01 DIAGNOSIS — Z23 Encounter for immunization: Secondary | ICD-10-CM

## 2014-09-01 LAB — CBC WITH DIFFERENTIAL/PLATELET
Basophils Absolute: 0 10*3/uL (ref 0.0–0.1)
Basophils Relative: 0.4 % (ref 0.0–3.0)
Eosinophils Absolute: 0.1 10*3/uL (ref 0.0–0.7)
Eosinophils Relative: 1.6 % (ref 0.0–5.0)
HEMATOCRIT: 42.9 % (ref 39.0–52.0)
Hemoglobin: 14.7 g/dL (ref 13.0–17.0)
LYMPHS ABS: 1.4 10*3/uL (ref 0.7–4.0)
Lymphocytes Relative: 28.6 % (ref 12.0–46.0)
MCHC: 34.2 g/dL (ref 30.0–36.0)
MCV: 86.8 fl (ref 78.0–100.0)
MONOS PCT: 9.9 % (ref 3.0–12.0)
Monocytes Absolute: 0.5 10*3/uL (ref 0.1–1.0)
Neutro Abs: 2.9 10*3/uL (ref 1.4–7.7)
Neutrophils Relative %: 59.5 % (ref 43.0–77.0)
PLATELETS: 154 10*3/uL (ref 150.0–400.0)
RBC: 4.95 Mil/uL (ref 4.22–5.81)
RDW: 14.9 % (ref 11.5–15.5)
WBC: 4.9 10*3/uL (ref 4.0–10.5)

## 2014-09-01 LAB — TSH: TSH: 2.43 u[IU]/mL (ref 0.35–4.50)

## 2014-09-01 LAB — URINALYSIS, ROUTINE W REFLEX MICROSCOPIC
BILIRUBIN URINE: NEGATIVE
Hgb urine dipstick: NEGATIVE
Ketones, ur: NEGATIVE
LEUKOCYTES UA: NEGATIVE
NITRITE: NEGATIVE
PH: 5.5 (ref 5.0–8.0)
RBC / HPF: NONE SEEN (ref 0–?)
SPECIFIC GRAVITY, URINE: 1.025 (ref 1.000–1.030)
TOTAL PROTEIN, URINE-UPE24: NEGATIVE
Urine Glucose: >=1000 — AB
Urobilinogen, UA: 0.2 (ref 0.0–1.0)

## 2014-09-01 LAB — HEMOGLOBIN A1C: HEMOGLOBIN A1C: 7.3 % — AB (ref 4.6–6.5)

## 2014-09-01 LAB — FECAL OCCULT BLOOD, GUAIAC: Fecal Occult Blood: NEGATIVE

## 2014-09-01 LAB — MICROALBUMIN / CREATININE URINE RATIO
Creatinine,U: 132.9 mg/dL
MICROALB/CREAT RATIO: 0.7 mg/g (ref 0.0–30.0)
Microalb, Ur: 0.9 mg/dL (ref 0.0–1.9)

## 2014-09-01 LAB — PSA: PSA: 0.49 ng/mL (ref 0.10–4.00)

## 2014-09-01 NOTE — Progress Notes (Signed)
Pre visit review using our clinic review tool, if applicable. No additional management support is needed unless otherwise documented below in the visit note. 

## 2014-09-01 NOTE — Assessment & Plan Note (Signed)
No s/s suggestive of this today

## 2014-09-01 NOTE — Assessment & Plan Note (Signed)

## 2014-09-01 NOTE — Assessment & Plan Note (Signed)
He is doing well on crestor Will recheck his FLP today

## 2014-09-01 NOTE — Assessment & Plan Note (Signed)
His blood sugars have been well controlled Will monitor his A1C and renal function today He is not willing to start an ACEI or an ARB today

## 2014-09-01 NOTE — Progress Notes (Signed)
Subjective:    Patient ID: Alex Meyers, male    DOB: January 20, 1941, 74 y.o.   MRN: [id number]  Hypertension Pertinent negatives include no blurred vision, chest pain, neck pain, palpitations or shortness of breath.  Hyperlipidemia Pertinent negatives include no chest pain, myalgias or shortness of breath.  Diabetes He presents for his follow-up diabetic visit. He has type 2 diabetes mellitus. The initial diagnosis of diabetes was made 5 years ago. His disease course has been stable. There are no hypoglycemic associated symptoms. Associated symptoms include polyuria. Pertinent negatives for diabetes include no blurred vision, no chest pain, no fatigue, no foot paresthesias, no foot ulcerations, no polydipsia, no polyphagia, no visual change, no weakness and no weight loss. There are no hypoglycemic complications. Symptoms are stable. Diabetic complications include heart disease. Current diabetic treatment includes oral agent (triple therapy). He is compliant with treatment all of the time. He is following a generally healthy diet. Meal planning includes avoidance of concentrated sweets. He participates in exercise daily. There is no change in his home blood glucose trend. An ACE inhibitor/angiotensin II receptor blocker is not being taken. He does not see a podiatrist.Eye exam is current.      Review of Systems  Constitutional: Negative.  Negative for fever, chills, weight loss, diaphoresis, appetite change and fatigue.  HENT: Negative.   Eyes: Negative.  Negative for blurred vision.  Respiratory: Negative.  Negative for cough, choking, chest tightness, shortness of breath and stridor.   Cardiovascular: Negative.  Negative for chest pain, palpitations and leg swelling.  Gastrointestinal: Negative.  Negative for nausea, vomiting, abdominal pain, diarrhea, constipation, blood in stool and rectal pain.  Endocrine: Positive for polyuria. Negative for polydipsia and polyphagia.  Genitourinary:  Negative.   Musculoskeletal: Negative.  Negative for myalgias, back pain, arthralgias and neck pain.  Skin: Negative.  Negative for rash.  Allergic/Immunologic: Negative.   Neurological: Negative.  Negative for weakness.  Hematological: Negative.  Negative for adenopathy. Does not bruise/bleed easily.  Psychiatric/Behavioral: Negative.        Objective:   Physical Exam  Constitutional: He is oriented to person, place, and time. He appears well-developed and well-nourished. No distress.  HENT:  Head: Normocephalic and atraumatic.  Mouth/Throat: Oropharynx is clear and moist. No oropharyngeal exudate.  Eyes: Conjunctivae are normal. Right eye exhibits no discharge. Left eye exhibits no discharge. No scleral icterus.  Neck: Normal range of motion. Neck supple. No JVD present. No tracheal deviation present. No thyromegaly present.  Cardiovascular: Normal rate, regular rhythm, normal heart sounds and intact distal pulses.  Exam reveals no gallop and no friction rub.   No murmur heard. Pulmonary/Chest: Effort normal and breath sounds normal. No stridor. No respiratory distress. He has no wheezes. He has no rales. He exhibits no tenderness.  Abdominal: Soft. Bowel sounds are normal. He exhibits no distension and no mass. There is no tenderness. There is no rebound and no guarding. Hernia confirmed negative in the right inguinal area and confirmed negative in the left inguinal area.  Genitourinary: Testes normal and penis normal. Rectal exam shows external hemorrhoid and internal hemorrhoid. Rectal exam shows no fissure, no mass, no tenderness and anal tone normal. Guaiac negative stool. Prostate is enlarged (1+ smooth symm BPH). Prostate is not tender. Right testis shows no mass, no swelling and no tenderness. Right testis is descended. Left testis shows no mass, no swelling and no tenderness. Left testis is descended. Circumcised. No penile erythema or penile tenderness. No discharge found.  Musculoskeletal: Normal range of motion. He exhibits no edema or tenderness.  Lymphadenopathy:    He has no cervical adenopathy.       Right: No inguinal adenopathy present.       Left: No inguinal adenopathy present.  Neurological: He is oriented to person, place, and time.  Skin: Skin is warm and dry. No rash noted. He is not diaphoretic. No erythema. No pallor.  Psychiatric: He has a normal mood and affect. His behavior is normal. Judgment and thought content normal.  Vitals reviewed.     Lab Results  Component Value Date   WBC 10.5 06/26/2014   HGB 14.3 06/26/2014   HCT 43.8 06/26/2014   PLT 135* 06/26/2014   GLUCOSE 150* 06/26/2014   CHOL 145 01/01/2013   TRIG 67.0 01/01/2013   HDL 82.00 01/01/2013   LDLCALC 50 01/01/2013   ALT 16 01/01/2013   ALT 16 01/01/2013   AST 21 01/01/2013   AST 21 01/01/2013   NA 137 06/26/2014   K 3.8 06/26/2014   CL 104 06/26/2014   CREATININE 1.10 06/26/2014   BUN 15 06/26/2014   CO2 24 06/26/2014   TSH 1.25 01/01/2013   PSA 1.07 12/27/2010   INR 1.2 ratio* 01/21/2009   HGBA1C 7.2* 01/01/2013   MICROALBUR 0.5 01/01/2013      Assessment & Plan:

## 2014-09-01 NOTE — Assessment & Plan Note (Signed)
His BP is well controlled and he occasionally complains of low blood pressure I wold like to start an ACIE or ARB for renal protection but he is concerned about low BP so will not start this at this time Will check his labs today to screen for secondary causes of HTN

## 2014-09-01 NOTE — Patient Instructions (Signed)

## 2014-09-02 ENCOUNTER — Encounter: Payer: Self-pay | Admitting: Internal Medicine

## 2014-09-02 LAB — COMPREHENSIVE METABOLIC PANEL
ALT: 17 U/L (ref 0–53)
AST: 20 U/L (ref 0–37)
Albumin: 4.2 g/dL (ref 3.5–5.2)
Alkaline Phosphatase: 50 U/L (ref 39–117)
BILIRUBIN TOTAL: 0.8 mg/dL (ref 0.2–1.2)
BUN: 18 mg/dL (ref 6–23)
CO2: 23 mEq/L (ref 19–32)
Calcium: 9.4 mg/dL (ref 8.4–10.5)
Chloride: 109 mEq/L (ref 96–112)
Creatinine, Ser: 1.12 mg/dL (ref 0.40–1.50)
GFR: 68.56 mL/min (ref >60.00)
GLUCOSE: 139 mg/dL — AB (ref 70–99)
Potassium: 4.1 mEq/L (ref 3.5–5.1)
Sodium: 144 mEq/L (ref 135–145)
TOTAL PROTEIN: 6.6 g/dL (ref 6.0–8.3)

## 2014-09-02 LAB — LIPID PANEL
CHOL/HDL RATIO: 2
Cholesterol: 150 mg/dL (ref 0–200)
HDL: 74.9 mg/dL (ref >39.00)
LDL Cholesterol: 59 mg/dL (ref 0–99)
NonHDL: 75.1
TRIGLYCERIDES: 79 mg/dL (ref 0.0–149.0)
VLDL: 15.8 mg/dL (ref 0.0–40.0)

## 2014-10-06 ENCOUNTER — Encounter: Payer: Self-pay | Admitting: Internal Medicine

## 2014-11-18 ENCOUNTER — Encounter: Payer: Self-pay | Admitting: *Deleted

## 2014-11-21 ENCOUNTER — Encounter: Payer: Medicare Other | Admitting: Physician Assistant

## 2014-11-21 ENCOUNTER — Other Ambulatory Visit (HOSPITAL_COMMUNITY): Payer: Self-pay | Admitting: Cardiovascular Disease

## 2014-11-21 ENCOUNTER — Encounter (INDEPENDENT_AMBULATORY_CARE_PROVIDER_SITE_OTHER): Payer: Medicare Other

## 2014-11-21 DIAGNOSIS — I2510 Atherosclerotic heart disease of native coronary artery without angina pectoris: Secondary | ICD-10-CM | POA: Diagnosis not present

## 2014-11-21 LAB — EXERCISE TOLERANCE TEST
CHL CUP MPHR: 149 {beats}/min
CHL CUP STRESS STAGE 1 SBP: 131 mmHg
CHL CUP STRESS STAGE 10 HR: 117 {beats}/min
CHL CUP STRESS STAGE 10 SBP: 217 mmHg
CHL CUP STRESS STAGE 11 SBP: 141 mmHg
CHL CUP STRESS STAGE 2 GRADE: 0 %
CHL CUP STRESS STAGE 2 HR: 72 {beats}/min
CHL CUP STRESS STAGE 3 GRADE: 0.1 %
CHL CUP STRESS STAGE 4 GRADE: 10 %
CHL CUP STRESS STAGE 4 SPEED: 1.7 mph
CHL CUP STRESS STAGE 5 HR: 90 {beats}/min
CHL CUP STRESS STAGE 6 SBP: 171 mmHg
CHL CUP STRESS STAGE 7 DBP: 79 mmHg
CHL CUP STRESS STAGE 7 HR: 127 {beats}/min
CHL CUP STRESS STAGE 7 SBP: 210 mmHg
CHL CUP STRESS STAGE 8 HR: 134 {beats}/min
CHL CUP STRESS STAGE 8 SBP: 210 mmHg
CHL CUP STRESS STAGE 8 SPEED: 5 mph
CHL CUP STRESS STAGE 9 HR: 137 {beats}/min
CHL RATE OF PERCEIVED EXERTION: 15
CSEPEW: 18.7 METS
CSEPPHR: 137 {beats}/min
CSEPPMHR: 91 %
Exercise duration (min): 16 min
Percent HR: 91 %
Rest HR: 57 {beats}/min
Stage 1 DBP: 80 mmHg
Stage 1 Grade: 0 %
Stage 1 HR: 64 {beats}/min
Stage 1 Speed: 0 mph
Stage 10 DBP: 83 mmHg
Stage 10 Grade: 0 %
Stage 10 Speed: 1.5 mph
Stage 11 DBP: 78 mmHg
Stage 11 Grade: 0 %
Stage 11 HR: 72 {beats}/min
Stage 11 Speed: 0 mph
Stage 2 Speed: 1 mph
Stage 3 HR: 71 {beats}/min
Stage 3 Speed: 1 mph
Stage 4 DBP: 63 mmHg
Stage 4 HR: 82 {beats}/min
Stage 4 SBP: 158 mmHg
Stage 5 DBP: 55 mmHg
Stage 5 Grade: 12 %
Stage 5 SBP: 161 mmHg
Stage 5 Speed: 2.5 mph
Stage 6 DBP: 61 mmHg
Stage 6 Grade: 14 %
Stage 6 HR: 101 {beats}/min
Stage 6 Speed: 3.4 mph
Stage 7 Grade: 16 %
Stage 7 Speed: 4.2 mph
Stage 8 DBP: 81 mmHg
Stage 8 Grade: 18 %
Stage 9 Grade: 20 %
Stage 9 Speed: 5.5 mph

## 2014-12-12 ENCOUNTER — Ambulatory Visit (INDEPENDENT_AMBULATORY_CARE_PROVIDER_SITE_OTHER): Payer: Medicare Other | Admitting: Internal Medicine

## 2014-12-12 ENCOUNTER — Telehealth: Payer: Self-pay

## 2014-12-12 ENCOUNTER — Encounter: Payer: Self-pay | Admitting: Internal Medicine

## 2014-12-12 VITALS — BP 110/70 | HR 80 | Ht 72.0 in | Wt 167.0 lb

## 2014-12-12 DIAGNOSIS — Z86010 Personal history of colonic polyps: Secondary | ICD-10-CM

## 2014-12-12 DIAGNOSIS — K5900 Constipation, unspecified: Secondary | ICD-10-CM | POA: Diagnosis not present

## 2014-12-12 DIAGNOSIS — D6959 Other secondary thrombocytopenia: Secondary | ICD-10-CM | POA: Diagnosis not present

## 2014-12-12 DIAGNOSIS — T45515A Adverse effect of anticoagulants, initial encounter: Secondary | ICD-10-CM

## 2014-12-12 DIAGNOSIS — Z1211 Encounter for screening for malignant neoplasm of colon: Secondary | ICD-10-CM | POA: Diagnosis not present

## 2014-12-12 DIAGNOSIS — Z7902 Long term (current) use of antithrombotics/antiplatelets: Secondary | ICD-10-CM

## 2014-12-12 NOTE — Telephone Encounter (Signed)
 [facility] [address] [place] [address]  12/12/2014   RE: Alex Meyers DOB: [birth year] MRN: [id number]   Dear Dr. Sherren Mocha,    We have scheduled the above patient for an endoscopic procedure. Our records show that he is on anticoagulation therapy.   Please advise as to how long the patient may come off his therapy of Plavix prior to the colonoscopy procedure, which is scheduled for 12/22/14.  Please fax back/ or route the completed form to [facility] [place], [place] at [phone].   Sincerely,    Dr Zenovia Jarred

## 2014-12-12 NOTE — Progress Notes (Signed)
Subjective:    Patient ID: Alex Meyers, male    DOB: Apr 02, 1941, 74 y.o.   MRN: [id number]  HPI Brack Shaddock is a 74 year old male with a past medical history of adenomatous colon polyps, internal hemorrhoids status post banding by Dr. Carlean Purl, CAD with PCI in July 2010 on Plavix, hypertension, hyperlipidemia and diabetes who seen for follow-up. He had a surveillance colonoscopy performed on 05/18/2011 where 8 polyps removed. Mild left-sided diverticulosis and internal hemorrhoids were seen. Pathology revealed 5 tumor adenomas and other polyps were benign lymphoid polyp. He returns today stating he is doing well. He has been dealing with constipation over the last several months and was using over-the-counter stool softener 1 tablet at night. He stopped this over the last several months. He was seen in the ER on 06/26/2014 with acute constipation but was able to have a bowel movement and felt better. Overall he reports he is doing well. He continues to exercise by running. Continues to work. Denies abdominal pain. No nausea or vomiting. No dysphagia or odynophagia. No further rectal bleeding whatsoever after hemorrhoidal banding. Occasionally after passing a large or hard stool he will have acute pain which resolves in short order. He had recent stress test with Dr. Burt Knack which he reports was normal  Review of Systems As per history of present illness, otherwise negative  Current Medications, Allergies, Past Medical History, Past Surgical History, Family History and Social History were reviewed in [facility] record.     Objective:   Physical Exam BP 110/70 mmHg  Pulse 80  Ht 6' (1.829 m)  Wt 167 lb (75.751 kg)  BMI 22.64 kg/m2 Constitutional: Well-developed and well-nourished. No distress. HEENT: Normocephalic and atraumatic. Oropharynx is clear and moist. No oropharyngeal exudate. Conjunctivae are normal.  No scleral icterus. Neck: Neck supple. Trachea  midline. Cardiovascular: Normal rate, regular rhythm and intact distal pulses. No M/R/G Pulmonary/chest: Effort normal and breath sounds normal. No wheezing, rales or rhonchi. Abdominal: Soft, nontender, nondistended. Bowel sounds active throughout. There are no masses palpable.  Extremities: no clubbing, cyanosis, or edema Lymphadenopathy: No cervical adenopathy noted. Neurological: Alert and oriented to person place and time. Skin: Skin is warm and dry. No rashes noted. Psychiatric: Normal mood and affect. Behavior is normal.  CBC    Component Value Date/Time   WBC 4.9 09/01/2014 1054   RBC 4.95 09/01/2014 1054   HGB 14.7 09/01/2014 1054   HCT 42.9 09/01/2014 1054   PLT 154.0 09/01/2014 1054   MCV 86.8 09/01/2014 1054   MCH 29.4 06/26/2014 1423   MCHC 34.2 09/01/2014 1054   RDW 14.9 09/01/2014 1054   LYMPHSABS 1.4 09/01/2014 1054   MONOABS 0.5 09/01/2014 1054   EOSABS 0.1 09/01/2014 1054   BASOSABS 0.0 09/01/2014 1054   CMP     Component Value Date/Time   NA 144 09/01/2014 1054   K 4.1 09/01/2014 1054   CL 109 09/01/2014 1054   CO2 23 09/01/2014 1054   GLUCOSE 139* 09/01/2014 1054   BUN 18 09/01/2014 1054   CREATININE 1.12 09/01/2014 1054   CALCIUM 9.4 09/01/2014 1054   PROT 6.6 09/01/2014 1054   ALBUMIN 4.2 09/01/2014 1054   AST 20 09/01/2014 1054   ALT 17 09/01/2014 1054   ALKPHOS 50 09/01/2014 1054   BILITOT 0.8 09/01/2014 1054   GFRNONAA 66* 06/26/2014 1423   GFRAA 76* 06/26/2014 1423       Assessment & Plan:   74 year old male with a past medical history  of adenomatous colon polyps, internal hemorrhoids status post banding by Dr. Carlean Purl, CAD with PCI in July 2010 on Plavix, hypertension, hyperlipidemia and diabetes who seen for follow-up  1. History of colon polyps on Plavix -- surveillance colonoscopy recommended. We discussed the risks, benefits and alternatives and he is agreeable to proceed. Hold Plavix 5 days before procedure - will instruct when and  how to resume after procedure. Risks and benefits of procedure including bleeding, perforation, infection, missed lesions, medication reactions and possible hospitalization or surgery if complications occur explained. Additional rare but real risk of cardiovascular event such as heart attack or ischemia/infarct of other organs off Plavix explained and need to seek urgent help if this occurs. Will communicate by phone or EMR with patient's prescribing provider that to confirm holding Plavix is reasonable in this case.   2. Constipation -- resume Colace 100-200 mg daily at bedtime. Call if this is not sufficient to control constipation

## 2014-12-12 NOTE — Patient Instructions (Addendum)
  We are sitting you up for a pre-visit with the nurse 12/17/14 at 8:30AM to discuss your colonoscopy instructions.  The colonoscopy is on the books for 12/22/14 at 11:30AM, arrive at 10:30AM.  You will be contaced by our office prior to your procedure for directions on holding your Plavix.  If you do not hear from our office 1 week prior to your scheduled procedure, please call [phone] to discuss.  Take colase over the counter100mg  , take 1-2 tablets every night.   I appreciate the opportunity to care for you.

## 2014-12-13 NOTE — Telephone Encounter (Signed)
Pt at low-risk of holding plavix 5 days prior to endoscopy and can resume at discretion of Dr Hilarie Fredrickson post-procedure. thx

## 2014-12-14 NOTE — Telephone Encounter (Signed)
Please let pt know Dr. Burt Knack ok holding Plavix 5 days before colonoscopy. I believe he is coming for previsit but was seen this past week in the office

## 2014-12-15 NOTE — Telephone Encounter (Signed)
Note added for previsit on 12/17/14

## 2014-12-17 ENCOUNTER — Ambulatory Visit (AMBULATORY_SURGERY_CENTER): Payer: Self-pay | Admitting: *Deleted

## 2014-12-17 VITALS — Ht 72.0 in | Wt 169.0 lb

## 2014-12-17 DIAGNOSIS — Z86010 Personal history of colonic polyps: Secondary | ICD-10-CM

## 2014-12-17 MED ORDER — NA SULFATE-K SULFATE-MG SULF 17.5-3.13-1.6 GM/177ML PO SOLN: ORAL | Status: DC

## 2014-12-17 NOTE — Progress Notes (Signed)
Patient denies any allergies to eggs or soy. Patient denies any problems with anesthesia/sedation. Patient denies any oxygen use at home and does not take any diet/weight loss medications. EMMI education assisgned to patient on colonoscopy, this was explained and instructions given to patient. 

## 2014-12-21 ENCOUNTER — Telehealth: Payer: Self-pay | Admitting: Gastroenterology

## 2014-12-21 NOTE — Telephone Encounter (Signed)
HE called today, forgot to hold his plavix for 5 days (for colonoscopy with Dr. Hilarie Fredrickson tomorrow). I explained it was safest to reschedule.  Mariann Barter.

## 2014-12-22 ENCOUNTER — Encounter: Payer: Medicare Other | Admitting: Internal Medicine

## 2014-12-22 NOTE — Telephone Encounter (Signed)
I have rescheduled patient to 7/7 @ 11:00

## 2014-12-22 NOTE — Telephone Encounter (Signed)
Pt needs to be rescheduled Thanks JMP

## 2014-12-22 NOTE — Telephone Encounter (Signed)
Spoke with pt; told him new instructions were mailed.  Reminded him to go off Plavix on 01-03-15

## 2014-12-29 ENCOUNTER — Other Ambulatory Visit: Payer: Self-pay | Admitting: Cardiovascular Disease

## 2014-12-31 ENCOUNTER — Ambulatory Visit (INDEPENDENT_AMBULATORY_CARE_PROVIDER_SITE_OTHER): Payer: Medicare Other | Admitting: Sports Medicine

## 2014-12-31 ENCOUNTER — Encounter: Payer: Self-pay | Admitting: Sports Medicine

## 2014-12-31 VITALS — BP 122/62

## 2014-12-31 DIAGNOSIS — S5001XA Contusion of right elbow, initial encounter: Secondary | ICD-10-CM | POA: Insufficient documentation

## 2014-12-31 DIAGNOSIS — M25521 Pain in right elbow: Secondary | ICD-10-CM | POA: Diagnosis not present

## 2014-12-31 NOTE — Progress Notes (Signed)
Patient ID: Alex Meyers, male   DOB: 08-Aug-1940, 74 y.o.   MRN: [id number]  Patient runs 6 miles usually 4-5 days a week This morning after running 3 miles he hit an irregular piece of sidewalk He fell on both outstretched hands and onto his right elbow and knee Skin shows a number of abrasions but his right elbow swelled up and is painful to try to extend  I had seen him in the past for a similar injury which turned out to be a small radial head fracture  Physical examination No acute distress BP 122/62 mmHg  Patient has a significantly swollen right elbow over the posterior and lateral part of the elbow There are multiple abrasions over the elbowsand over the hands and right lateral leg He has fairly normal supination and pronation of the elbow with pain in the last 20 of supination He is unable to extend the right elbow by about 10-15 No tenderness over the distal humerus except at the olecranon area No tenderness over the medial elbow There is mild tenderness over the lateral elbow in the area of the radial head  Ultrasound examination There are 2 hematomas on the distal posterior humerus There is a large hematoma in the olecranon bursa There appears to be a small bony irregularity and probable fracture at the tip of the olecranon The radial head has a slight irregularity with swelling possibly suggestive of a radial head fracture The elbow joint itself does not show significant effusion

## 2014-12-31 NOTE — Assessment & Plan Note (Signed)
We cleansed and placed ointment over the area of injury Telfa padding of abrasions Gauze padding followed by Coban wrap Keep this wrapped intermittently for the next several days  X-rays of the elbow are pending  He will only do moderate activity and if this becomes painful we will have him use a sling

## 2015-01-01 ENCOUNTER — Ambulatory Visit
Admission: RE | Admit: 2015-01-01 | Discharge: 2015-01-01 | Disposition: A | Discharge status: Home / Self Care | Payer: Medicare Other | Source: Ambulatory Visit | Attending: Sports Medicine | Admitting: Sports Medicine

## 2015-01-01 DIAGNOSIS — M25521 Pain in right elbow: Secondary | ICD-10-CM

## 2015-01-08 ENCOUNTER — Encounter: Payer: Self-pay | Admitting: Internal Medicine

## 2015-01-08 ENCOUNTER — Ambulatory Visit (AMBULATORY_SURGERY_CENTER): Payer: Medicare Other | Admitting: Internal Medicine

## 2015-01-08 VITALS — BP 115/69 | HR 51 | Temp 96.2°F | Resp 12 | Ht 72.0 in | Wt 169.0 lb

## 2015-01-08 DIAGNOSIS — Z860100 Personal history of colon polyps, unspecified: Secondary | ICD-10-CM

## 2015-01-08 DIAGNOSIS — D124 Benign neoplasm of descending colon: Secondary | ICD-10-CM

## 2015-01-08 DIAGNOSIS — D125 Benign neoplasm of sigmoid colon: Secondary | ICD-10-CM | POA: Diagnosis not present

## 2015-01-08 LAB — GLUCOSE, CAPILLARY
GLUCOSE-CAPILLARY: 93 mg/dL (ref 65–99)
Glucose-Capillary: 101 mg/dL — ABNORMAL HIGH (ref 65–99)

## 2015-01-08 MED ORDER — SODIUM CHLORIDE 0.9 % IV SOLN
500.0000 mL | INTRAVENOUS | Status: DC
Start: 2015-01-08 — End: 2015-01-08

## 2015-01-08 NOTE — Patient Instructions (Signed)
YOU HAD AN ENDOSCOPIC PROCEDURE TODAY AT [facility]:   Refer to the procedure report that was given to you for any specific questions about what was found during the examination.  If the procedure report does not answer your questions, please call your gastroenterologist to clarify.  If you requested that your care partner not be given the details of your procedure findings, then the procedure report has been included in a sealed envelope for you to review at your convenience later.  YOU SHOULD EXPECT: Some feelings of bloating in the abdomen. Passage of more gas than usual.  Walking can help get rid of the air that was put into your GI tract during the procedure and reduce the bloating. If you had a lower endoscopy (such as a colonoscopy or flexible sigmoidoscopy) you may notice spotting of blood in your stool or on the toilet paper. If you underwent a bowel prep for your procedure, you may not have a normal bowel movement for a few days.  Please Note:  You might notice some irritation and congestion in your nose or some drainage.  This is from the oxygen used during your procedure.  There is no need for concern and it should clear up in a day or so.  SYMPTOMS TO REPORT IMMEDIATELY:   Following lower endoscopy (colonoscopy or flexible sigmoidoscopy):  Excessive amounts of blood in the stool  Significant tenderness or worsening of abdominal pains  Swelling of the abdomen that is new, acute  Fever of 100F or higher  For urgent or emergent issues, a gastroenterologist can be reached at any hour by calling [phone].   DIET: Your first meal following the procedure should be a small meal and then it is ok to progress to your normal diet. Heavy or fried foods are harder to digest and may make you feel nauseous or bloated.  Likewise, meals heavy in dairy and vegetables can increase bloating.  Drink plenty of fluids but you should avoid alcoholic beverages for 24  hours.  ACTIVITY:  You should plan to take it easy for the rest of today and you should NOT DRIVE or use heavy machinery until tomorrow (because of the sedation medicines used during the test).    FOLLOW UP: Our staff will call the number listed on your records the next business day following your procedure to check on you and address any questions or concerns that you may have regarding the information given to you following your procedure. If we do not reach you, we will leave a message.  However, if you are feeling well and you are not experiencing any problems, there is no need to return our call.  We will assume that you have returned to your regular daily activities without incident.  If any biopsies were taken you will be contacted by phone or by letter within the next 1-3 weeks.  Please call us at [phone] if you have not heard about the biopsies in 3 weeks.    SIGNATURES/CONFIDENTIALITY: You and/or your care partner have signed paperwork which will be entered into your electronic medical record.  These signatures attest to the fact that that the information above on your After Visit Summary has been reviewed and is understood.  Full responsibility of the confidentiality of this discharge information lies with you and/or your care-partner.  Polyps, diverticulosis, high fiber diet-handouts given  Repeat colonoscopy will be determined by pathology.

## 2015-01-08 NOTE — Progress Notes (Signed)
Called to room to assist during endoscopic procedure.  Patient ID and intended procedure confirmed with present staff. Received instructions for my participation in the procedure from the performing physician.  

## 2015-01-08 NOTE — Progress Notes (Signed)
[place] with Dr. Hilarie Fredrickson that pt. Should start plavix back on 01/09/15.

## 2015-01-08 NOTE — Op Note (Signed)
[facility]  [address] [place], [id number]   COLONOSCOPY PROCEDURE REPORT  PATIENT: Alex Meyers, Alex Meyers  MR#: [id number] BIRTHDATE: 1941/04/22 , 74  yrs. old GENDER: male ENDOSCOPIST: Jerene Bears, MD PROCEDURE DATE:  01/08/2015 PROCEDURE:   Colonoscopy, surveillance , Colonoscopy with cold biopsy polypectomy, and Colonoscopy with snare polypectomy First Screening Colonoscopy - Avg.  risk and is 50 yrs.  old or older - No.  Prior Negative Screening - Now for repeat screening. N/A  History of Adenoma - Now for follow-up colonoscopy & has been > or = to 3 yrs.  Yes hx of adenoma.  Has been 3 or more years since last colonoscopy.  Polyps removed today? Yes ASA CLASS:   Class III INDICATIONS:Surveillance due to prior colonic neoplasia and [place] Colon Adenoma (last colonoscopy Nov 2012). MEDICATIONS: Monitored anesthesia care and Propofol 250 mg IV  DESCRIPTION OF PROCEDURE:   After the risks benefits and alternatives of the procedure were thoroughly explained, informed consent was obtained.  The digital rectal exam revealed no rectal mass.   The LB [id number]  endoscope was introduced through the anus and advanced to the cecum, which was identified by both the appendix and ileocecal valve. No adverse events experienced. The quality of the prep was good.  (Suprep was used)  The instrument was then slowly withdrawn as the colon was fully examined. Estimated blood loss is zero unless otherwise noted in this procedure report.   COLON FINDINGS: Three sessile polyps ranging between 3-79mm in size were found in the descending colon and sigmoid colon. Polypectomies were performed with a cold snare (1) and with cold forceps (2).  The resection was complete, the polyp tissue was completely retrieved and sent to histology. There was mild diverticulosis noted in the descending colon and sigmoid colon. Retroflexed views revealed small internal hemorrhoids. The time to cecum  = 4.9 Withdrawal time = 18.6   The scope was withdrawn and the procedure completed. COMPLICATIONS: There were no immediate complications.  ENDOSCOPIC IMPRESSION: 1.   Three sessile polyps ranging between 3-60mm in size were found in the descending colon and sigmoid colon; polypectomies were performed with a cold snare and with cold forceps 2.   Mild diverticulosis was noted in the descending colon and sigmoid colon  RECOMMENDATIONS: 1.  Await pathology results 2.  High fiber diet 3.  Timing of repeat colonoscopy will be determined by pathology findings. 4.  You will receive a letter within 1-2 weeks with the results of your biopsy as well as final recommendations.  Please call my office if you have not received a letter after 3 weeks.  eSigned:  Jerene Bears, MD 01/08/2015 11:11 AM cc: Janith Lima, MD and The Patient

## 2015-01-08 NOTE — Progress Notes (Signed)
Patient awakening,vss,report to rn 

## 2015-01-09 ENCOUNTER — Telehealth: Payer: Self-pay | Admitting: *Deleted

## 2015-01-09 NOTE — Telephone Encounter (Signed)
No answer, left message to call if questions or concerns. 

## 2015-01-14 ENCOUNTER — Encounter: Payer: Self-pay | Admitting: Internal Medicine

## 2015-01-14 LAB — HM COLONOSCOPY

## 2015-01-28 ENCOUNTER — Other Ambulatory Visit: Payer: Self-pay | Admitting: Cardiovascular Disease

## 2015-02-24 LAB — HEMOGLOBIN A1C: HEMOGLOBIN A1C: 7 % — AB (ref 4.0–6.0)

## 2015-02-28 ENCOUNTER — Other Ambulatory Visit: Payer: Self-pay | Admitting: Cardiovascular Disease

## 2015-03-02 ENCOUNTER — Ambulatory Visit: Payer: Medicare Other | Admitting: Internal Medicine

## 2015-03-05 ENCOUNTER — Encounter: Payer: Self-pay | Admitting: Internal Medicine

## 2015-03-05 ENCOUNTER — Ambulatory Visit (INDEPENDENT_AMBULATORY_CARE_PROVIDER_SITE_OTHER): Payer: Medicare Other | Admitting: Internal Medicine

## 2015-03-05 VITALS — BP 98/62 | HR 68 | Temp 97.4°F | Resp 16 | Ht 72.0 in | Wt 163.0 lb

## 2015-03-05 DIAGNOSIS — Z23 Encounter for immunization: Secondary | ICD-10-CM | POA: Diagnosis not present

## 2015-03-05 DIAGNOSIS — H9319 Tinnitus, unspecified ear: Secondary | ICD-10-CM | POA: Diagnosis not present

## 2015-03-05 DIAGNOSIS — I10 Essential (primary) hypertension: Secondary | ICD-10-CM | POA: Diagnosis not present

## 2015-03-05 DIAGNOSIS — E118 Type 2 diabetes mellitus with unspecified complications: Secondary | ICD-10-CM

## 2015-03-05 DIAGNOSIS — E785 Hyperlipidemia, unspecified: Secondary | ICD-10-CM | POA: Diagnosis not present

## 2015-03-05 NOTE — Progress Notes (Signed)
Pre visit review using our clinic review tool, if applicable. No additional management support is needed unless otherwise documented below in the visit note. 

## 2015-03-05 NOTE — Patient Instructions (Signed)
Tinnitus °Sounds you hear in your ears and coming from within the ear is called tinnitus. This can be a symptom of many ear disorders. It is often associated with hearing loss.  °Tinnitus can be seen with: °· Infections. °· Ear blockages such as wax buildup. °· Meniere's disease. °· Ear damage. °· Inherited. °· Occupational causes. °While irritating, it is not usually a threat to health. When the cause of the tinnitus is wax, infection in the middle ear, or foreign body it is easily treated. Hearing loss will usually be reversible.  °TREATMENT  °When treating the underlying cause does not get rid of tinnitus, it may be necessary to get rid of the unwanted sound by covering it up with more pleasant background noises. This may include music, the radio etc. There are tinnitus maskers which can be worn which produce background noise to cover up the tinnitus. °Avoid all medications which tend to make tinnitus worse such as alcohol, caffeine, aspirin, and nicotine. There are many soothing background tapes such as rain, ocean, thunderstorms, etc. These soothing sounds help with sleeping or resting. °Keep all follow-up appointments and referrals. This is important to identify the cause of the problem. It also helps avoid complications, impaired hearing, disability, or chronic pain. °Document Released: 06/20/2005 Document Revised: 09/12/2011 Document Reviewed: 02/06/2008 °ExitCare® Patient Information ©2015 [employer]. This information is not intended to replace advice given to you by your health care provider. Make sure you discuss any questions you have with your health care provider. ° °

## 2015-03-06 NOTE — Assessment & Plan Note (Signed)
He has achieved his LDL goal and is doing well on the statin 

## 2015-03-06 NOTE — Assessment & Plan Note (Addendum)
His blood sugars are well-controlled, I recommend that he continue his current regimen.

## 2015-03-06 NOTE — Assessment & Plan Note (Signed)
His blood pressure is well-controlled, his blood pressure is too low to start an antihypertensive.

## 2015-03-06 NOTE — Progress Notes (Signed)
Subjective:  Patient ID: Alex Meyers, male    DOB: 12-Feb-1941  Age: 73 y.o. MRN: [id number]  CC: Hyperlipidemia and Diabetes   HPI Alex Meyers presents for follow-up on hypercholesterolemia and diabetes. He is also concerned that his blood pressure is a little low. He has never had high blood pressure and over the last year his systolic has been about 741 -115. He complains of fatigue but no lightheadedness, dizziness, shortness of breath, chest pain, syncope, or near-syncope. He complains that Onglyza and Crestor too expensive but he is willing to continue paying for them. In addition, he complains that he hears roaches in his ears all the time. He recently saw an ENT doctor and had wax removed. The ENT doctor told him that his hearing and ears were otherwise normal.  Outpatient Prescriptions Prior to Visit  Medication Sig Dispense Refill  . ACZONE 5 % topical gel Apply 1 application topically daily as needed.     Marland Kitchen aspirin 81 MG tablet Take 1 tablet (81 mg total) by mouth daily. 30 tablet 0  . Canagliflozin (INVOKANA) 300 MG TABS Take 1 tablet by mouth daily. Taking 1/2 tablet    . clopidogrel (PLAVIX) 75 MG tablet TAKE 1 TABLET BY MOUTH EVERY DAY 30 tablet 0  . DiphenhydrAMINE HCl, Sleep, (ZZZQUIL PO) Take 2 tablets by mouth at bedtime.    . metFORMIN (GLUCOPHAGE-XR) 500 MG 24 hr tablet Take 1 tablet by mouth at bedtime.  12  . pioglitazone (ACTOS) 45 MG tablet Take 45 mg by mouth daily.  11  . rosuvastatin (CRESTOR) 20 MG tablet Take 1 tablet (20 mg total) by mouth daily. 30 tablet 12  . saxagliptin HCl (ONGLYZA) 5 MG TABS tablet Take 1 tablet (5 mg total) by mouth daily. 30 tablet 0  . sildenafil (VIAGRA) 100 MG tablet Take 100 mg by mouth as needed.     . Polyethylene Glycol 3350 (MIRALAX PO) Take by mouth as needed (for constipation).     No facility-administered medications prior to visit.    ROS Review of Systems  Constitutional: Positive for fatigue. Negative for  fever, chills, diaphoresis, activity change, appetite change and unexpected weight change.  HENT: Positive for hearing loss and tinnitus. Negative for dental problem, nosebleeds, rhinorrhea, sinus pressure and trouble swallowing.   Eyes: Negative.   Respiratory: Negative.  Negative for cough, choking, chest tightness, shortness of breath and stridor.   Cardiovascular: Negative.  Negative for palpitations and leg swelling.  Gastrointestinal: Negative.  Negative for nausea, vomiting, abdominal pain, diarrhea, constipation and blood in stool.  Endocrine: Negative.   Genitourinary: Negative.   Musculoskeletal: Negative.  Negative for myalgias, back pain, arthralgias and neck stiffness.  Skin: Negative.  Negative for rash.  Allergic/Immunologic: Negative.   Neurological: Negative.  Negative for dizziness, tremors, syncope, speech difficulty, weakness, light-headedness, numbness and headaches.  Hematological: Negative.   Psychiatric/Behavioral: Negative.     Objective:  BP 98/62 mmHg  Pulse 68  Temp(Src) 97.4 F (36.3 C) (Oral)  Resp 16  Ht 6' (1.829 m)  Wt 163 lb (73.936 kg)  BMI 22.10 kg/m2  SpO2 96%  BP Readings from Last 3 Encounters:  03/05/15 98/62  01/08/15 115/69  12/31/14 122/62    Wt Readings from Last 3 Encounters:  03/05/15 163 lb (73.936 kg)  01/08/15 169 lb (76.658 kg)  12/17/14 169 lb (76.658 kg)    Physical Exam  Constitutional: He is oriented to person, place, and time. He appears well-developed and well-nourished.  No distress.  HENT:  Head: Normocephalic and atraumatic.  Right Ear: Hearing, tympanic membrane, external ear and ear canal normal.  Left Ear: Hearing, tympanic membrane, external ear and ear canal normal.  Mouth/Throat: Oropharynx is clear and moist. No oropharyngeal exudate.  Eyes: Conjunctivae are normal. Right eye exhibits no discharge. Left eye exhibits no discharge. No scleral icterus.  Neck: Normal range of motion. Neck supple. No JVD  present. No tracheal deviation present. No thyromegaly present.  Cardiovascular: Normal rate, regular rhythm, normal heart sounds and intact distal pulses.  Exam reveals no gallop and no friction rub.   No murmur heard. Pulmonary/Chest: Effort normal and breath sounds normal. No stridor. No respiratory distress. He has no wheezes. He has no rales. He exhibits no tenderness.  Abdominal: Soft. Bowel sounds are normal. He exhibits no distension and no mass. There is no tenderness. There is no rebound and no guarding.  Musculoskeletal: Normal range of motion. He exhibits no edema or tenderness.  Lymphadenopathy:    He has no cervical adenopathy.  Neurological: He is oriented to person, place, and time.  Skin: Skin is warm and dry. No rash noted. He is not diaphoretic. No erythema. No pallor.  Psychiatric: He has a normal mood and affect. His behavior is normal. Judgment and thought content normal.  Vitals reviewed.   Lab Results  Component Value Date   WBC 4.9 09/01/2014   HGB 14.7 09/01/2014   HCT 42.9 09/01/2014   PLT 154.0 09/01/2014   GLUCOSE 139* 09/01/2014   CHOL 150 09/01/2014   TRIG 79.0 09/01/2014   HDL 74.90 09/01/2014   LDLCALC 59 09/01/2014   ALT 17 09/01/2014   AST 20 09/01/2014   NA 144 09/01/2014   K 4.1 09/01/2014   CL 109 09/01/2014   CREATININE 1.12 09/01/2014   BUN 18 09/01/2014   CO2 23 09/01/2014   TSH 2.43 09/01/2014   PSA 0.49 09/01/2014   INR 1.2 ratio* 01/21/2009   HGBA1C 7.0* 02/24/2015   MICROALBUR 0.9 09/01/2014    Dg Elbow 2 Views Right  01/01/2015   CLINICAL DATA:  [place] yesterday with pain and swelling over the olecranon  EXAM: RIGHT ELBOW - 2 VIEW  COMPARISON:  Right elbow films of 05/15/2013  FINDINGS: The patient did have a prior right radial head and neck fracture in 2014. On the current study there is a large right elbow joint effusion and there is soft tissue swelling over the olecranon. The presence of the effusion is very suggestive of an  occult fracture probably involving the right radial head and/or neck. No displacement is seen however.  IMPRESSION: Large right elbow joint effusion most consistent with an occult fracture presumably of the right radial head and neck.   Electronically Signed   By: Ivar Drape M.D.   On: 01/01/2015 10:37    Assessment & Plan:   Devaun was seen today for hyperlipidemia and diabetes.  Diagnoses and all orders for this visit:  Tinnitus aurium, unspecified laterality- I will refer to audiology for formal hearing evaluation and to consider treatment options for tinnitus. -     Ambulatory referral to Audiology  Need for prophylactic vaccination and inoculation against influenza -     Flu vaccine HIGH DOSE PF (Fluzone High dose)   I have discontinued Mr. Smoak's Polyethylene Glycol 3350 (MIRALAX PO). I am also having him maintain his aspirin, rosuvastatin, saxagliptin HCl, sildenafil, ACZONE, canagliflozin, pioglitazone, metFORMIN, (DiphenhydrAMINE HCl, Sleep, (ZZZQUIL PO)), and clopidogrel.  No orders of  the defined types were placed in this encounter.     Follow-up: Return in about 4 months (around 07/05/2015).  Scarlette Calico, MD

## 2015-03-31 ENCOUNTER — Other Ambulatory Visit: Payer: Self-pay | Admitting: Cardiovascular Disease

## 2015-04-30 ENCOUNTER — Other Ambulatory Visit: Payer: Self-pay | Admitting: Cardiovascular Disease

## 2015-05-05 ENCOUNTER — Other Ambulatory Visit: Payer: Self-pay | Admitting: Otolaryngology

## 2015-05-05 DIAGNOSIS — H9311 Tinnitus, right ear: Secondary | ICD-10-CM

## 2015-05-05 DIAGNOSIS — H905 Unspecified sensorineural hearing loss: Secondary | ICD-10-CM

## 2015-05-08 LAB — HM DIABETES EYE EXAM

## 2015-05-12 ENCOUNTER — Encounter: Payer: Self-pay | Admitting: Internal Medicine

## 2015-05-20 ENCOUNTER — Inpatient Hospital Stay: Admission: RE | Admit: 2015-05-20 | Payer: Medicare Other | Source: Ambulatory Visit

## 2015-06-04 ENCOUNTER — Ambulatory Visit
Admission: RE | Admit: 2015-06-04 | Discharge: 2015-06-04 | Disposition: A | Discharge status: Home / Self Care | Payer: Medicare Other | Source: Ambulatory Visit | Attending: Otolaryngology | Admitting: Otolaryngology

## 2015-06-04 DIAGNOSIS — H905 Unspecified sensorineural hearing loss: Secondary | ICD-10-CM

## 2015-06-04 DIAGNOSIS — H9311 Tinnitus, right ear: Secondary | ICD-10-CM

## 2015-06-04 MED ORDER — GADOBENATE DIMEGLUMINE 529 MG/ML IV SOLN: 15.0000 mL | Freq: Once | INTRAVENOUS | Status: DC | PRN

## 2015-06-07 ENCOUNTER — Other Ambulatory Visit: Payer: Self-pay | Admitting: Cardiovascular Disease

## 2015-06-08 NOTE — Telephone Encounter (Signed)
Alex Mocha, MD at 11/15/2013 8:57 AM  clopidogrel (PLAVIX) 75 MG tabletTAKE 1 TABLET EVERY DAY ASSESSMENT AND PLAN: 1. Coronary artery disease, native vessel. The patient is stable without anginal symptoms. He is tolerating long-term dual antiplatelet therapy with aspirin and Plavix.

## 2015-06-26 ENCOUNTER — Other Ambulatory Visit: Payer: Self-pay | Admitting: Cardiovascular Disease

## 2015-07-07 ENCOUNTER — Ambulatory Visit (INDEPENDENT_AMBULATORY_CARE_PROVIDER_SITE_OTHER): Payer: Medicare Other | Admitting: Internal Medicine

## 2015-07-07 ENCOUNTER — Encounter: Payer: Self-pay | Admitting: Internal Medicine

## 2015-07-07 VITALS — BP 98/60 | HR 63 | Temp 97.5°F | Resp 16 | Ht 72.0 in | Wt 164.0 lb

## 2015-07-07 DIAGNOSIS — E118 Type 2 diabetes mellitus with unspecified complications: Secondary | ICD-10-CM

## 2015-07-07 DIAGNOSIS — I10 Essential (primary) hypertension: Secondary | ICD-10-CM

## 2015-07-07 DIAGNOSIS — M7700 Medial epicondylitis, unspecified elbow: Secondary | ICD-10-CM | POA: Insufficient documentation

## 2015-07-07 DIAGNOSIS — E11628 Type 2 diabetes mellitus with other skin complications: Secondary | ICD-10-CM

## 2015-07-07 DIAGNOSIS — I2510 Atherosclerotic heart disease of native coronary artery without angina pectoris: Secondary | ICD-10-CM

## 2015-07-07 DIAGNOSIS — E785 Hyperlipidemia, unspecified: Secondary | ICD-10-CM | POA: Diagnosis not present

## 2015-07-07 DIAGNOSIS — M7702 Medial epicondylitis, left elbow: Secondary | ICD-10-CM

## 2015-07-07 DIAGNOSIS — L84 Corns and callosities: Secondary | ICD-10-CM

## 2015-07-07 MED ORDER — DICLOFENAC SODIUM 2 % TD SOLN: 1.0000 | Freq: Three times a day (TID) | TRANSDERMAL | Status: DC | PRN

## 2015-07-07 NOTE — Progress Notes (Signed)
Pre visit review using our clinic review tool, if applicable. No additional management support is needed unless otherwise documented below in the visit note. 

## 2015-07-07 NOTE — Progress Notes (Signed)
Subjective:  Patient ID: Alex Meyers, male    DOB: 21-Apr-1941  Age: 75 y.o. MRN: [id number]  CC: Hyperlipidemia; Hypertension; and Elbow Pain   HPI Alex Meyers presents for f/up with complaints.  Outpatient Prescriptions Prior to Visit  Medication Sig Dispense Refill  . ACZONE 5 % topical gel Apply 1 application topically daily as needed.     Marland Kitchen aspirin 81 MG tablet Take 1 tablet (81 mg total) by mouth daily. 30 tablet 0  . Canagliflozin (INVOKANA) 300 MG TABS Take 1 tablet by mouth daily. Taking 1/2 tablet    . clopidogrel (PLAVIX) 75 MG tablet TAKE 1 TABLET BY MOUTH EVERY DAY 30 tablet 0  . DiphenhydrAMINE HCl, Sleep, (ZZZQUIL PO) Take 2 tablets by mouth at bedtime.    . metFORMIN (GLUCOPHAGE-XR) 500 MG 24 hr tablet Take 1 tablet by mouth at bedtime.  12  . pioglitazone (ACTOS) 45 MG tablet Take 45 mg by mouth daily.  11  . rosuvastatin (CRESTOR) 20 MG tablet Take 1 tablet (20 mg total) by mouth daily. 30 tablet 12  . saxagliptin HCl (ONGLYZA) 5 MG TABS tablet Take 1 tablet (5 mg total) by mouth daily. 30 tablet 0  . sildenafil (VIAGRA) 100 MG tablet Take 100 mg by mouth as needed.      No facility-administered medications prior to visit.    ROS Review of Systems  Constitutional: Negative for fever, chills, diaphoresis, appetite change and fatigue.  HENT: Negative.   Eyes: Negative.   Respiratory: Negative.  Negative for cough, choking, chest tightness, shortness of breath and stridor.   Cardiovascular: Negative.  Negative for chest pain and leg swelling.  Gastrointestinal: Negative.  Negative for nausea, vomiting, abdominal pain, diarrhea and constipation.  Endocrine: Negative.  Negative for polydipsia, polyphagia and polyuria.  Genitourinary: Negative.   Musculoskeletal: Positive for arthralgias. Negative for back pain.       1- right 3rd toe pain for several weeks 2- left medial elbow pain for 4 months, no hx of injury  Skin: Negative.  Negative for color change  and pallor.  Allergic/Immunologic: Negative.   Neurological: Negative.  Negative for dizziness and light-headedness.  Hematological: Negative.  Negative for adenopathy. Does not bruise/bleed easily.  Psychiatric/Behavioral: Negative.     Objective:  BP 98/60 mmHg  Pulse 63  Temp(Src) 97.5 F (36.4 C) (Oral)  Resp 16  Ht 6' (1.829 m)  Wt 164 lb (74.39 kg)  BMI 22.24 kg/m2  SpO2 95%  BP Readings from Last 3 Encounters:  07/07/15 98/60  03/05/15 98/62  01/08/15 115/69    Wt Readings from Last 3 Encounters:  07/07/15 164 lb (74.39 kg)  06/04/15 165 lb (74.844 kg)  03/05/15 163 lb (73.936 kg)    Physical Exam  Constitutional: He is oriented to person, place, and time. He appears well-developed and well-nourished. No distress.  HENT:  Head: Normocephalic and atraumatic.  Mouth/Throat: Oropharynx is clear and moist. No oropharyngeal exudate.  Eyes: Conjunctivae are normal. Right eye exhibits no discharge. Left eye exhibits no discharge. No scleral icterus.  Neck: Normal range of motion. Neck supple. No JVD present. No tracheal deviation present. No thyromegaly present.  Cardiovascular: Normal rate, regular rhythm, normal heart sounds and intact distal pulses.  Exam reveals no gallop and no friction rub.   No murmur heard. Pulmonary/Chest: Effort normal and breath sounds normal. No stridor. No respiratory distress. He has no wheezes. He has no rales. He exhibits no tenderness.  Abdominal: Soft. Bowel sounds  are normal. He exhibits no distension and no mass. There is no tenderness. There is no rebound and no guarding.  Musculoskeletal: Normal range of motion. He exhibits no edema or tenderness.       Left elbow: He exhibits normal range of motion, no swelling, no effusion, no deformity and no laceration. No radial head, no lateral epicondyle and no olecranon process tenderness noted.       Right foot: There is deformity. There is normal range of motion, no tenderness, no bony  tenderness, no swelling, normal capillary refill, no crepitus and no laceration.       Feet:  Lymphadenopathy:    He has no cervical adenopathy.  Neurological: He is oriented to person, place, and time.  Skin: Skin is warm and dry. No rash noted. He is not diaphoretic. No erythema. No pallor.  Vitals reviewed.   Lab Results  Component Value Date   WBC 4.9 09/01/2014   HGB 14.7 09/01/2014   HCT 42.9 09/01/2014   PLT 154.0 09/01/2014   GLUCOSE 139* 09/01/2014   CHOL 150 09/01/2014   TRIG 79.0 09/01/2014   HDL 74.90 09/01/2014   LDLCALC 59 09/01/2014   ALT 17 09/01/2014   AST 20 09/01/2014   NA 144 09/01/2014   K 4.1 09/01/2014   CL 109 09/01/2014   CREATININE 1.12 09/01/2014   BUN 18 09/01/2014   CO2 23 09/01/2014   TSH 2.43 09/01/2014   PSA 0.49 09/01/2014   INR 1.2 ratio* 01/21/2009   HGBA1C 7.0* 02/24/2015   MICROALBUR 0.9 09/01/2014    Mr Brain W Wo Contrast  06/04/2015  CLINICAL DATA:  Right-sided sensorineural hearing loss and tinnitus. Creatinine was obtained on site at [facility] at [address] Results: Creatinine 1.1 mg/dL. EXAM: MRI HEAD WITHOUT AND WITH CONTRAST TECHNIQUE: Multiplanar, multiecho pulse sequences of the brain and surrounding structures were obtained without and with intravenous contrast. CONTRAST:  15 mL MultiHance COMPARISON:  None. FINDINGS: There is no evidence of acute infarct, intracranial hemorrhage, mass, midline shift, or extra-axial fluid collection. Ventricles and sulci are normal for age. Small foci of T2 hyperintensity in the subcortical and deep cerebral white matter bilaterally are nonspecific but compatible with mild chronic small vessel ischemic disease. No abnormal enhancement is identified. Orbits are unremarkable. There is trace right maxillary sinus fluid. The mastoid air cells are clear. Major intracranial vascular flow voids are preserved. Dedicated imaging through the internal auditory canals demonstrates a normal  course of cranial nerves VII and VIII without evidence of mass or abnormal enhancement. Inner ear structures demonstrate normal signal bilaterally. No mass is seen within the cerebellopontine angles. IMPRESSION: 1. Unremarkable appearance of the internal auditory canals. No evidence of vestibular schwannoma. 2. Mild chronic small vessel ischemic disease. Electronically Signed   By: Logan Bores M.D.   On: 06/04/2015 11:07    Assessment & Plan:   Eliyohu was seen today for hyperlipidemia, hypertension and elbow pain.  Diagnoses and all orders for this visit:  Atherosclerosis of native coronary artery without angina pectoris, unspecified whether native or transplanted heart -     Lipid panel; Future  Essential hypertension -     Basic metabolic panel; Future -     Urinalysis, Routine w reflex microscopic (not at [facility]); Future  Diabetes mellitus with complication (HCC) -     Lipid panel; Future -     Basic metabolic panel; Future -     Microalbumin / creatinine urine ratio; Future  Hyperlipidemia  with target LDL less than 70 -     Lipid panel; Future  Medial epicondylitis of elbow, left -     Diclofenac Sodium (PENNSAID) 2 % SOLN; Place 1 Act onto the skin 3 (three) times daily as needed.  Type 2 diabetes mellitus with pressure callus (HCC) -     Ambulatory referral to Podiatry  Corn or callus -     Ambulatory referral to Podiatry  I am having Mr. Baril start on Diclofenac Sodium. I am also having him maintain his aspirin, rosuvastatin, saxagliptin HCl, sildenafil, ACZONE, canagliflozin, pioglitazone, metFORMIN, (DiphenhydrAMINE HCl, Sleep, (ZZZQUIL PO)), and clopidogrel.  Meds ordered this encounter  Medications  . Diclofenac Sodium (PENNSAID) 2 % SOLN    Sig: Place 1 Act onto the skin 3 (three) times daily as needed.    Dispense:  112 g    Refill:  5     Follow-up: Return in about 6 weeks (around 08/18/2015).  Scarlette Calico, MD

## 2015-07-07 NOTE — Patient Instructions (Signed)
Medial Epicondylitis With Rehab Medial epicondylitis involves inflammation and pain around the inner (medial) portion of the elbow. This pain is caused by inflammation of the tendons in the forearm that flex (bring down) the wrist. Medial epicondylitis is also called golfer's elbow, because it is common among golfers. However, it may occur in any individual who flexes the wrist regularly. If medial epicondylitis is left untreated, it may become a chronic problem. SYMPTOMS   Pain, tenderness, or inflammation over the inner (medial) side of the elbow.  Pain or weakness with gripping activities.  Pain that increases with wrist twisting motions (using a screwdriver, playing golf, bowling). CAUSES  Medial epicondylitis is caused by inflammation of the tendons that flex the wrist. Causes of injury may include:  Chronic, repetitive stress and strain to the tendons that run from the wrist and forearm to the elbow.  Sudden strain on the forearm, including wrist snap when serving balls with racquet sports, or throwing a baseball. RISK INCREASES WITH:  Sports or occupations that require repetitive and/or strenuous forearm and wrist movements (pitching a baseball, golfing, carpentry).  Poor wrist and forearm strength and flexibility.  Failure to warm up properly before activity.  Resuming activity before healing, rehabilitation, and conditioning are complete. PREVENTION   Warm up and stretch properly before activity.  Maintain physical fitness:  Strength, flexibility, and endurance.  Cardiovascular fitness.  Wear and use properly fitted equipment.  Learn and use proper technique and have a coach correct improper technique.  Wear a tennis elbow (counterforce) brace. PROGNOSIS  The course of this condition depends on the degree of the injury. If treated properly, acute cases (symptoms lasting less than 4 weeks) are often resolved in 2 to 6 weeks. Chronic (longer lasting cases) often resolve  in 3 to 6 months, but may require physical therapy. RELATED COMPLICATIONS   Frequently recurring symptoms, resulting in a chronic problem. Properly treating the problem the first time decreases frequency of recurrence.  Chronic inflammation, scarring, and partial tendon tear, requiring surgery.  Delayed healing or resolution of symptoms. TREATMENT  Treatment first involves the use of ice and medicine, to reduce pain and inflammation. Strengthening and stretching exercises may reduce discomfort, if performed regularly. These exercises may be performed at home, if the condition is an acute injury. Chronic cases may require a referral to a physical therapist for evaluation and treatment. Your caregiver may advise a corticosteroid injection to help reduce inflammation. Rarely, surgery is needed. MEDICATION  If pain medicine is needed, nonsteroidal anti-inflammatory medicines (aspirin and ibuprofen), or other minor pain relievers (acetaminophen), are often advised.  Do not take pain medicine for 7 days before surgery.  Prescription pain relievers may be given, if your caregiver thinks they are needed. Use only as directed and only as much as you need.  Corticosteroid injections may be recommended. These injections should be reserved only for the most severe cases, because they can only be given a certain number of times. HEAT AND COLD  Cold treatment (icing) should be applied for 10 to 15 minutes every 2 to 3 hours for inflammation and pain, and immediately after activity that aggravates your symptoms. Use ice packs or an ice massage.  Heat treatment may be used before performing stretching and strengthening activities prescribed by your caregiver, physical therapist, or athletic trainer. Use a heat pack or a warm water soak. SEEK MEDICAL CARE IF: Symptoms get worse or do not improve in 2 weeks, despite treatment. EXERCISES  RANGE OF MOTION (ROM) AND   STRETCHING EXERCISES - Epicondylitis, Medial  (Golfer's Elbow) These exercises may help you when beginning to rehabilitate your injury. Your symptoms may go away with or without further involvement from your physician, physical therapist or athletic trainer. While completing these exercises, remember:   Restoring tissue flexibility helps normal motion to return to the joints. This allows healthier, less painful movement and activity.  An effective stretch should be held for at least 30 seconds.  A stretch should never be painful. You should only feel a gentle lengthening or release in the stretched tissue. RANGE OF MOTION - Wrist Flexion, Active-Assisted  Extend your right / left elbow with your fingers pointing down.*  Gently pull the back of your hand towards you, until you feel a gentle stretch on the top of your forearm.  Hold this position for __________ seconds. Repeat __________ times. Complete this exercise __________ times per day.  *If directed by your physician, physical therapist or athletic trainer, complete this stretch with your elbow bent, rather than extended. RANGE OF MOTION - Wrist Extension, Active-Assisted  Extend your right / left elbow and turn your palm upwards.*  Gently pull your palm and fingertips back, so your wrist extends and your fingers point more toward the ground.  You should feel a gentle stretch on the inside of your forearm.  Hold this position for __________ seconds. Repeat __________ times. Complete this exercise __________ times per day. *If directed by your physician, physical therapist or athletic trainer, complete this stretch with your elbow bent, rather than extended. STRETCH - Wrist Extension   Place your right / left fingertips on a tabletop leaving your elbow slightly bent. Your fingers should point backwards.  Gently press your fingers and palm down onto the table, by straightening your elbow. You should feel a stretch on the inside of your forearm.  Hold this position for  __________ seconds. Repeat __________ times. Complete this stretch __________ times per day.  STRENGTHENING EXERCISES - Epicondylitis, Medial (Golfer's Elbow) These exercises may help you when beginning to rehabilitate your injury. They may resolve your symptoms with or without further involvement from your physician, physical therapist or athletic trainer. While completing these exercises, remember:   Muscles can gain both the endurance and the strength needed for everyday activities through controlled exercises.  Complete these exercises as instructed by your physician, physical therapist or athletic trainer. Increase the resistance and repetitions only as guided.  You may experience muscle soreness or fatigue, but the pain or discomfort you are trying to eliminate should never worsen during these exercises. If this pain does get worse, stop and make sure you are following the directions exactly. If the pain is still present after adjustments, discontinue the exercise until you can discuss the trouble with your caregiver. STRENGTH - Wrist Flexors  Sit with your right / left forearm palm-up, and fully supported on a table or countertop. Your elbow should be resting below the height of your shoulder. Allow your wrist to extend over the edge of the surface.  Loosely holding a __________ weight, or a piece of rubber exercise band or tubing, slowly curl your hand up toward your forearm.  Hold this position for __________ seconds. Slowly lower the wrist back to the starting position in a controlled manner. Repeat __________ times. Complete this exercise __________ times per day.  STRENGTH - Wrist Extensors  Sit with your right / left forearm palm-down and fully supported. Your elbow should be resting below the height of your shoulder. Allow your   wrist to extend over the edge of the surface.  Loosely holding a __________ weight, or a piece of rubber exercise band or tubing, slowly curl your hand up  toward your forearm.  Hold this position for __________ seconds. Slowly lower the wrist back to the starting position in a controlled manner. Repeat __________ times. Complete this exercise __________ times per day.  STRENGTH - Ulnar Deviators  Stand with a ____________________ weight in your right / left hand, or sit while holding a rubber exercise band or tubing, with your healthy arm supported on a table or countertop.  Move your wrist so that your pinkie travels toward your forearm and your thumb moves away from your forearm.  Hold this position for __________ seconds and then slowly lower the wrist back to the starting position. Repeat __________ times. Complete this exercise __________ times per day STRENGTH - Grip   Grasp a tennis ball, a dense sponge, or a large, rolled sock in your hand.  Squeeze as hard as you can, without increasing any pain.  Hold this position for __________ seconds. Release your grip slowly. Repeat __________ times. Complete this exercise __________ times per day.  STRENGTH - Forearm Supinators   Sit with your right / left forearm supported on a table, keeping your elbow below shoulder height. Rest your hand over the edge, palm down.  Gently grip a hammer or a soup ladle.  Without moving your elbow, slowly turn your palm and hand upward to a "thumbs-up" position.  Hold this position for __________ seconds. Slowly return to the starting position. Repeat __________ times. Complete this exercise __________ times per day.  STRENGTH - Forearm Pronators  Sit with your right / left forearm supported on a table, keeping your elbow below shoulder height. Rest your hand over the edge, palm up.  Gently grip a hammer or a soup ladle.  Without moving your elbow, slowly turn your palm and hand upward to a "thumbs-up" position.  Hold this position for __________ seconds. Slowly return to the starting position. Repeat __________ times. Complete this exercise  __________ times per day.    This information is not intended to replace advice given to you by your health care provider. Make sure you discuss any questions you have with your health care provider.   Document Released: 06/20/2005 Document Revised: 07/11/2014 Document Reviewed: 10/02/2008 Elsevier Interactive Patient Education 2016 [employer]  

## 2015-07-23 ENCOUNTER — Other Ambulatory Visit: Payer: Self-pay | Admitting: Cardiovascular Disease

## 2015-07-30 ENCOUNTER — Encounter: Payer: Self-pay | Admitting: Podiatry

## 2015-07-30 ENCOUNTER — Ambulatory Visit (INDEPENDENT_AMBULATORY_CARE_PROVIDER_SITE_OTHER): Payer: Medicare Other | Admitting: Podiatry

## 2015-07-30 ENCOUNTER — Ambulatory Visit (INDEPENDENT_AMBULATORY_CARE_PROVIDER_SITE_OTHER): Payer: Medicare Other

## 2015-07-30 VITALS — BP 111/66 | HR 64 | Resp 12

## 2015-07-30 DIAGNOSIS — M722 Plantar fascial fibromatosis: Secondary | ICD-10-CM

## 2015-07-30 DIAGNOSIS — E119 Type 2 diabetes mellitus without complications: Secondary | ICD-10-CM

## 2015-07-30 MED ORDER — MELOXICAM 15 MG PO TABS: 15.0000 mg | ORAL_TABLET | Freq: Every day | ORAL | Status: DC

## 2015-07-30 NOTE — Progress Notes (Signed)
   Subjective:    Patient ID: Alex Meyers, male    DOB: 1940-10-15, 75 y.o.   MRN: [id number]  HPI he presents today as an avid runner running approximately 20 miles per week. He states that recently has started to develop some pain along the dorsal lateral aspect of the right foot with some numbness and burning to the plantar aspect of the ball of the right foot. He denies any trauma but states that the tighter shoe gear worst the pain. He still nothing other than shoe gear change to help alleviate his symptoms.    Review of Systems  Cardiovascular: Positive for leg swelling.       Objective:   Physical Exam: Vital signs are stable he is alert and oriented 3 no apparent distress. I have reviewed his past A history medications allergies surgery social history and review of systems. Pulses are strongly palpable bilateral. Neurologic sensorium is intact percent was the monofilament. Deep tendon reflexes are intact bilateral and muscle strength is 5 over 5 dorsiflexion plantar flexors and inverters and everters all digits musculature is intact. Orthopedic evaluation demonstrates all joints distal to the ankle for range of motion without crepitation. He has pain on palpation of the fourth and fifth met cuboid articulation as well as distal to that area. He also has pain on palpation medially continue tubercle of the right heel. Radiographs confirm soft tissue increase in density plantar fascial cannula insertion site no fractures to the fourth or fifth metatarsal. Cutaneous evaluationof a well-hydrated cutis no erythema edema cellulitis drainage or odor.        Assessment & Plan:  Assessment: Plantar fasciitis with lateral compensatory syndrome being the primary complication at this point.  Plan: I injected his right heel today from a plantar fascial brace and a night splint. Start him on a Medrol Dosepak and I will follow-up with him in 1 month.

## 2015-07-31 ENCOUNTER — Other Ambulatory Visit: Payer: Self-pay | Admitting: Cardiovascular Disease

## 2015-08-12 ENCOUNTER — Other Ambulatory Visit: Payer: Self-pay | Admitting: Cardiovascular Disease

## 2015-08-14 ENCOUNTER — Other Ambulatory Visit: Payer: Self-pay | Admitting: Cardiovascular Disease

## 2015-08-14 ENCOUNTER — Telehealth: Payer: Self-pay | Admitting: Cardiovascular Disease

## 2015-08-14 MED ORDER — CLOPIDOGREL BISULFATE 75 MG PO TABS
75.0000 mg | ORAL_TABLET | Freq: Every day | ORAL | Status: DC
Start: 2015-08-14 — End: 2016-01-11

## 2015-08-14 NOTE — Telephone Encounter (Signed)
Pt's Rx was sent to pt's pharmacy as requested. Confirmation received.  °

## 2015-08-14 NOTE — Telephone Encounter (Signed)
*  STAT* If patient is at the pharmacy, call can be transferred to refill team.  Pt stated Pharmacy required appt before refill- pt is sched for June 12 w/ Dr cooper- Pt stated is completely out of med and needs refill by this afternoon. [place]. Please advise    1. Which medications need to be refilled? (please list name of each medication and dose if known) Plavix 75 mg   2. Which pharmacy/location (including street and city if local pharmacy) is medication to be sent to? NEW- [facility]- [place]  3. Do they need a 30 day or 90 day supply? [place]

## 2015-08-27 ENCOUNTER — Encounter: Payer: Self-pay | Admitting: Podiatry

## 2015-08-27 ENCOUNTER — Ambulatory Visit (INDEPENDENT_AMBULATORY_CARE_PROVIDER_SITE_OTHER): Payer: Medicare Other | Admitting: Podiatry

## 2015-08-27 VITALS — BP 107/52 | HR 60 | Resp 16

## 2015-08-27 DIAGNOSIS — M722 Plantar fascial fibromatosis: Secondary | ICD-10-CM

## 2015-08-27 DIAGNOSIS — G5781 Other specified mononeuropathies of right lower limb: Secondary | ICD-10-CM

## 2015-08-27 DIAGNOSIS — G5761 Lesion of plantar nerve, right lower limb: Secondary | ICD-10-CM | POA: Diagnosis not present

## 2015-08-27 NOTE — Progress Notes (Signed)
He presents today for his second visit. He states that he seems to be more in the toes now than it does in the side of the foot or the metatarsals. He states that is starting to feel much better and he is able to run normally. But by the end of the run he says is uncomfortable because of the pain in the toes.  Objective: Vital signs are stable he is alert and oriented 3. Pulses are palpable. There is a palpable Mulder's click to the third interdigital space of the right foot demonstrating a neuroma. He also has pain on palpation medial aspect of the right heel. This is indicative of plantar fasciitis.  Assessment: Resolving plantar fasciitis with lateral compensatory syndrome initiating neuroma.  Plan: Injected the plantar fascial site today with Kenalog and local anesthetic and injected a neuroma with Kenalog and local anesthetic for the first time. He will continue the use of his plantar fascial and night splint. I will follow-up with him in 1 month.

## 2015-09-01 DIAGNOSIS — L814 Other melanin hyperpigmentation: Secondary | ICD-10-CM | POA: Diagnosis not present

## 2015-09-01 DIAGNOSIS — L821 Other seborrheic keratosis: Secondary | ICD-10-CM | POA: Diagnosis not present

## 2015-09-01 DIAGNOSIS — L853 Xerosis cutis: Secondary | ICD-10-CM | POA: Diagnosis not present

## 2015-09-01 DIAGNOSIS — D2272 Melanocytic nevi of left lower limb, including hip: Secondary | ICD-10-CM | POA: Diagnosis not present

## 2015-09-01 DIAGNOSIS — Z85828 Personal history of other malignant neoplasm of skin: Secondary | ICD-10-CM | POA: Diagnosis not present

## 2015-09-01 DIAGNOSIS — L738 Other specified follicular disorders: Secondary | ICD-10-CM | POA: Diagnosis not present

## 2015-09-01 DIAGNOSIS — D225 Melanocytic nevi of trunk: Secondary | ICD-10-CM | POA: Diagnosis not present

## 2015-09-01 DIAGNOSIS — D692 Other nonthrombocytopenic purpura: Secondary | ICD-10-CM | POA: Diagnosis not present

## 2015-09-01 DIAGNOSIS — D2261 Melanocytic nevi of right upper limb, including shoulder: Secondary | ICD-10-CM | POA: Diagnosis not present

## 2015-09-10 DIAGNOSIS — E042 Nontoxic multinodular goiter: Secondary | ICD-10-CM | POA: Diagnosis not present

## 2015-09-10 DIAGNOSIS — E7800 Pure hypercholesterolemia, unspecified: Secondary | ICD-10-CM | POA: Diagnosis not present

## 2015-09-10 DIAGNOSIS — E1165 Type 2 diabetes mellitus with hyperglycemia: Secondary | ICD-10-CM | POA: Diagnosis not present

## 2015-09-11 DIAGNOSIS — E119 Type 2 diabetes mellitus without complications: Secondary | ICD-10-CM | POA: Diagnosis not present

## 2015-09-11 DIAGNOSIS — H04123 Dry eye syndrome of bilateral lacrimal glands: Secondary | ICD-10-CM | POA: Diagnosis not present

## 2015-09-11 DIAGNOSIS — H10413 Chronic giant papillary conjunctivitis, bilateral: Secondary | ICD-10-CM | POA: Diagnosis not present

## 2015-09-11 DIAGNOSIS — H01024 Squamous blepharitis left upper eyelid: Secondary | ICD-10-CM | POA: Diagnosis not present

## 2015-09-11 DIAGNOSIS — H01022 Squamous blepharitis right lower eyelid: Secondary | ICD-10-CM | POA: Diagnosis not present

## 2015-09-11 DIAGNOSIS — H01021 Squamous blepharitis right upper eyelid: Secondary | ICD-10-CM | POA: Diagnosis not present

## 2015-09-11 DIAGNOSIS — H2513 Age-related nuclear cataract, bilateral: Secondary | ICD-10-CM | POA: Diagnosis not present

## 2015-09-11 DIAGNOSIS — H01025 Squamous blepharitis left lower eyelid: Secondary | ICD-10-CM | POA: Diagnosis not present

## 2015-09-14 DIAGNOSIS — I2510 Atherosclerotic heart disease of native coronary artery without angina pectoris: Secondary | ICD-10-CM | POA: Diagnosis not present

## 2015-09-14 DIAGNOSIS — E7800 Pure hypercholesterolemia, unspecified: Secondary | ICD-10-CM | POA: Diagnosis not present

## 2015-09-14 DIAGNOSIS — E042 Nontoxic multinodular goiter: Secondary | ICD-10-CM | POA: Diagnosis not present

## 2015-09-14 DIAGNOSIS — E1169 Type 2 diabetes mellitus with other specified complication: Secondary | ICD-10-CM | POA: Diagnosis not present

## 2015-09-14 DIAGNOSIS — E1165 Type 2 diabetes mellitus with hyperglycemia: Secondary | ICD-10-CM | POA: Diagnosis not present

## 2015-09-23 ENCOUNTER — Other Ambulatory Visit: Payer: Self-pay | Admitting: Podiatry

## 2015-09-24 NOTE — Telephone Encounter (Signed)
Pt is to be reevaluated by Dr. Milinda Pointer in less than 30 days.

## 2015-09-28 DIAGNOSIS — E1165 Type 2 diabetes mellitus with hyperglycemia: Secondary | ICD-10-CM | POA: Diagnosis not present

## 2015-09-28 DIAGNOSIS — E7800 Pure hypercholesterolemia, unspecified: Secondary | ICD-10-CM | POA: Diagnosis not present

## 2015-09-28 DIAGNOSIS — I2510 Atherosclerotic heart disease of native coronary artery without angina pectoris: Secondary | ICD-10-CM | POA: Diagnosis not present

## 2015-09-28 DIAGNOSIS — E042 Nontoxic multinodular goiter: Secondary | ICD-10-CM | POA: Diagnosis not present

## 2015-09-28 DIAGNOSIS — E1169 Type 2 diabetes mellitus with other specified complication: Secondary | ICD-10-CM | POA: Diagnosis not present

## 2015-10-08 ENCOUNTER — Encounter: Payer: Self-pay | Admitting: Podiatry

## 2015-10-08 ENCOUNTER — Ambulatory Visit (INDEPENDENT_AMBULATORY_CARE_PROVIDER_SITE_OTHER): Payer: Medicare Other | Admitting: Podiatry

## 2015-10-08 VITALS — BP 109/65 | HR 63 | Resp 12

## 2015-10-08 DIAGNOSIS — G5761 Lesion of plantar nerve, right lower limb: Secondary | ICD-10-CM

## 2015-10-08 DIAGNOSIS — G5781 Other specified mononeuropathies of right lower limb: Secondary | ICD-10-CM

## 2015-10-08 NOTE — Progress Notes (Signed)
He presents today for follow-up of his plantar fasciitis and neuroma right foot. He states that he was doing fine and the forefoot is approximately 85% improved. He states he has some burning in his toes on occasion.  Objective: Vital signs are stable he is alert and oriented 3. Pulses are strongly palpable. He still has pain on palpation to the third interdigital space of the right foot but no Mulder's click.  Assessment: Well-healing neuroma and plantar fasciitis.  Plan: Reinjected the neuroma today with dexamethasone and local anesthetic follow-up with him as needed.

## 2015-11-23 DIAGNOSIS — E7800 Pure hypercholesterolemia, unspecified: Secondary | ICD-10-CM | POA: Diagnosis not present

## 2015-11-23 DIAGNOSIS — E1165 Type 2 diabetes mellitus with hyperglycemia: Secondary | ICD-10-CM | POA: Diagnosis not present

## 2015-11-24 DIAGNOSIS — I2510 Atherosclerotic heart disease of native coronary artery without angina pectoris: Secondary | ICD-10-CM | POA: Diagnosis not present

## 2015-11-24 DIAGNOSIS — E1169 Type 2 diabetes mellitus with other specified complication: Secondary | ICD-10-CM | POA: Diagnosis not present

## 2015-11-24 DIAGNOSIS — E1165 Type 2 diabetes mellitus with hyperglycemia: Secondary | ICD-10-CM | POA: Diagnosis not present

## 2015-11-24 DIAGNOSIS — E042 Nontoxic multinodular goiter: Secondary | ICD-10-CM | POA: Diagnosis not present

## 2015-11-24 DIAGNOSIS — E7800 Pure hypercholesterolemia, unspecified: Secondary | ICD-10-CM | POA: Diagnosis not present

## 2015-11-30 ENCOUNTER — Other Ambulatory Visit: Payer: Self-pay | Admitting: Podiatry

## 2015-12-03 DIAGNOSIS — Z85828 Personal history of other malignant neoplasm of skin: Secondary | ICD-10-CM | POA: Diagnosis not present

## 2015-12-03 DIAGNOSIS — L738 Other specified follicular disorders: Secondary | ICD-10-CM | POA: Diagnosis not present

## 2015-12-03 DIAGNOSIS — D692 Other nonthrombocytopenic purpura: Secondary | ICD-10-CM | POA: Diagnosis not present

## 2015-12-14 ENCOUNTER — Ambulatory Visit: Payer: Medicare Other | Admitting: Cardiovascular Disease

## 2015-12-21 ENCOUNTER — Ambulatory Visit (INDEPENDENT_AMBULATORY_CARE_PROVIDER_SITE_OTHER): Payer: Medicare Other | Admitting: Cardiovascular Disease

## 2015-12-21 ENCOUNTER — Encounter (INDEPENDENT_AMBULATORY_CARE_PROVIDER_SITE_OTHER): Payer: Self-pay

## 2015-12-21 ENCOUNTER — Encounter: Payer: Self-pay | Admitting: Cardiovascular Disease

## 2015-12-21 VITALS — BP 112/60 | HR 66 | Ht 72.0 in | Wt 162.0 lb

## 2015-12-21 DIAGNOSIS — R9431 Abnormal electrocardiogram [ECG] [EKG]: Secondary | ICD-10-CM | POA: Diagnosis not present

## 2015-12-21 NOTE — Patient Instructions (Signed)
Medication Instructions:  Your physician recommends that you continue on your current medications as directed. Please refer to the Current Medication list given to you today.  Labwork: No new orders.   Testing/Procedures: Your physician has requested that you have a stress echocardiogram. For further information please visit [email or web address]. Please follow instruction sheet as given.  Follow-Up: Your physician wants you to follow-up in: 1 YEAR with Dr Cooper.  You will receive a reminder letter in the mail two months in advance. If you don't receive a letter, please call our office to schedule the follow-up appointment.   Any Other Special Instructions Will Be Listed Below (If Applicable).     If you need a refill on your cardiac medications before your next appointment, please call your pharmacy.   

## 2015-12-21 NOTE — Progress Notes (Signed)
Cardiology Office Note Date:  12/21/2015   ID:  Alex Meyers, DOB 13-Mar-1941, MRN [id number]  PCP:  Scarlette Calico, MD  Cardiologist:  Sherren Mocha, MD    Chief Complaint  Patient presents with  . Coronary Artery Disease    History of Present Illness: Alex Meyers is a 75 y.o. male who presents for followup evaluation. The patient has coronary artery disease and he underwent stenting of the LAD and diagonal branches in 2010 after presenting with exertional angina and an abnormal nuclear scan.   Today, he denies symptoms of palpitations, chest pain, shortness of breath, orthopnea, PND, lower extremity edema, dizziness, or syncope. He continues to exercise regularly without exertional symptoms.  Past Medical History  Diagnosis Date  . Diabetes mellitus   . Coronary artery disease     s//p DES LAD and Dx1 01/2009  . Multiple thyroid nodules   . Personal history of colonic polyps 08/09/2002    ADENOMATOUS POLYP  . Hyperlipidemia     pt denies  . Hypertension     pt. denies  . Hx of colonic polyps 08/2010    Dr. Hilarie Fredrickson  . Internal hemorrhoids with Grade 2-3 prolapse and bleeding 09/25/2012    Diagnosed by Dr. Linda Hedges via anoscopy   . Diverticulosis   . Tubular adenoma of colon     Past Surgical History  Procedure Laterality Date  . Colonoscopy  multiple  . Coronary stent placement  01/22/2009    right side  . Wrist surgery      right side    Current Outpatient Prescriptions  Medication Sig Dispense Refill  . ACZONE 5 % topical gel Apply 1 application topically daily as needed.     Marland Kitchen aspirin 81 MG tablet Take 1 tablet (81 mg total) by mouth daily. 30 tablet 0  . Canagliflozin (INVOKANA) 300 MG TABS Take 1 tablet by mouth daily. Taking 1/2 tablet    . clopidogrel (PLAVIX) 75 MG tablet Take 1 tablet (75 mg total) by mouth daily. 30 tablet 4  . meloxicam (MOBIC) 15 MG tablet TAKE 1 TABLET BY MOUTH EVERY DAY 30 tablet 0  . metFORMIN (GLUCOPHAGE-XR) 500 MG 24 hr tablet  Take 1 tablet by mouth at bedtime.  12  . pioglitazone (ACTOS) 45 MG tablet Take 45 mg by mouth daily.  11  . rosuvastatin (CRESTOR) 20 MG tablet Take 1 tablet (20 mg total) by mouth daily. 30 tablet 12  . saxagliptin HCl (ONGLYZA) 5 MG TABS tablet Take 1 tablet (5 mg total) by mouth daily. 30 tablet 0  . sildenafil (VIAGRA) 100 MG tablet Take 100 mg by mouth as needed.      No current facility-administered medications for this visit.    Allergies:   Review of patient's allergies indicates no known allergies.   Social History:  The patient  reports that he has never smoked. He has never used smokeless tobacco. He reports that he drinks about 4.2 oz of alcohol per week. He reports that he does not use illicit drugs.   Family History:  The patient's  family history includes Bladder Cancer (age of onset: 99) in his father; Breast cancer in his daughter; Coronary artery disease in his mother; Heart failure in his mother. There is no history of Colon cancer.   ROS:  Please see the history of present illness. All other systems are reviewed and negative.   PHYSICAL EXAM: VS:  BP 112/60 mmHg  Pulse 66  Ht 6' (1.829 m)  Wt 162 lb (73.483 kg)  BMI 21.97 kg/m2  SpO2 98% , BMI Body mass index is 21.97 kg/(m^2). GEN: Well nourished, well developed, in no acute distress HEENT: normal Neck: no JVD, no masses. No carotid bruits Cardiac: RRR without murmur or gallop                Respiratory:  clear to auscultation bilaterally, normal work of breathing GI: soft, nontender, nondistended, + BS MS: no deformity or atrophy Ext: no pretibial edema, pedal pulses 2+= bilaterally Skin: warm and dry, no rash Neuro:  Strength and sensation are intact Psych: euthymic mood, full affect  EKG:  EKG is ordered today. The ekg ordered today shows normal sinus rhythm 66 bpm, age-indeterminate anteroseptal infarct, otherwise within normal limits.  Recent Labs: No results found for requested labs within last 365  days.   Lipid Panel     Component Value Date/Time   CHOL 150 09/01/2014 1054   TRIG 79.0 09/01/2014 1054   HDL 74.90 09/01/2014 1054   CHOLHDL 2 09/01/2014 1054   VLDL 15.8 09/01/2014 1054   LDLCALC 59 09/01/2014 1054      Wt Readings from Last 3 Encounters:  12/21/15 162 lb (73.483 kg)  07/07/15 164 lb (74.39 kg)  06/04/15 165 lb (74.844 kg)     Cardiac Studies Reviewed: Stress Test 11-21-2014: Study Highlights     There was no ST segment deviation noted during stress.  Excellent exercise capacity. No chest pain. Normal BP response to exercise. No ST changes to suggest ischemia. Rare brief episode of SVT during exercise. Occasional PVCs during recovery. One couplet. FU with Dr. Sherren Mocha as planned.   Cardiac Cath 2010: CONCLUSION: 1. Coronary artery disease with 90% stenosis in the proximal left  anterior descending and 40% narrowing in the proximal and the mid  left anterior descending and 95% stenosis in the first large  diagonal branch in its distal portion, no major obstruction of the  circumflex and right coronary arteries, mild apical wall  hypokinesis with an estimated fraction of 50%. 2. Successful intracoronary vascular ultrasound-guided percutaneous  coronary intervention of the lesion in the proximal left anterior  descending using a XIENCE drug-eluting stent with improvement in  center narrowing from 90% to 0%. 3. Successful percutaneous coronary intervention of the lesion in the  diagonal branch of the left anterior descending using a XIENCE drug-  eluting stent with improvement in center narrowing from 95% to 0%.  DISPOSITION: The patient returned to post angio room for further observation. He should remain on Plavix at least a year.  ASSESSMENT AND PLAN: 1.  CAD, native vessel, with no angina. The patient's medications are reviewed and will be continued without changes. His EKG today shows a  significant change, now with an age-indeterminate anteroseptal infarct with Q waves extending into lead V3. I think a stress echocardiogram is indicated to make sure the patient has not had an interval event and also to assess for significant ischemia. He does have a history of LAD and diagonal stenting in the past.  2. Hyperlipidemia: He continues on Crestor 20 mg daily.  3. Type 2 diabetes: Followed by his primary care physician. Treated with oral hypoglycemics.  Current medicines are reviewed with the patient today.  The patient does not have concerns regarding medicines.  Labs/ tests ordered today include:   Orders Placed This Encounter  Procedures  . EKG 12-Lead  . ECHO STRESS TEST    Disposition:   FU one year as  long as stress echo is low-risk.  Deatra James, MD  12/21/2015 5:31 PM    [address], [place], [place]  [address] Phone: [phone]; Fax: [phone]

## 2016-01-06 ENCOUNTER — Telehealth: Payer: Self-pay | Admitting: Cardiovascular Disease

## 2016-01-06 NOTE — Telephone Encounter (Signed)
New Message  Pt call requesting to speak with Rn about scheduling a meeting with Dr. Burt Knack. Pt states he wants his daughter to listen in on the sit down meeting. Please call back to discuss

## 2016-01-06 NOTE — Telephone Encounter (Signed)
I spoke with the pt and he would like to arrange a meeting with Dr Burt Knack on 01/22/16 for his daughter to meet Dr Burt Knack and discuss the results of his stress echo which is pending on 01/18/16. The pt is working with his daughter to have everything in place for her to take over his care as he continues to age. The pt is arranging meetings with all of his physicians this specific day since his daughter will be in town.

## 2016-01-07 ENCOUNTER — Telehealth: Payer: Self-pay | Admitting: Internal Medicine

## 2016-01-07 NOTE — Telephone Encounter (Signed)
Let pt know that Dr. Hilarie Fredrickson is not in the office that day in July. Pt scheduled to see Dr. Hilarie Fredrickson 03/11/16@1 :30pm for rectal discomfort, pt aware of appt.

## 2016-01-11 ENCOUNTER — Other Ambulatory Visit: Payer: Self-pay | Admitting: Cardiovascular Disease

## 2016-01-12 DIAGNOSIS — H01025 Squamous blepharitis left lower eyelid: Secondary | ICD-10-CM | POA: Diagnosis not present

## 2016-01-12 DIAGNOSIS — H04123 Dry eye syndrome of bilateral lacrimal glands: Secondary | ICD-10-CM | POA: Diagnosis not present

## 2016-01-12 DIAGNOSIS — H10413 Chronic giant papillary conjunctivitis, bilateral: Secondary | ICD-10-CM | POA: Diagnosis not present

## 2016-01-12 DIAGNOSIS — H01022 Squamous blepharitis right lower eyelid: Secondary | ICD-10-CM | POA: Diagnosis not present

## 2016-01-12 DIAGNOSIS — H01021 Squamous blepharitis right upper eyelid: Secondary | ICD-10-CM | POA: Diagnosis not present

## 2016-01-12 DIAGNOSIS — H01024 Squamous blepharitis left upper eyelid: Secondary | ICD-10-CM | POA: Diagnosis not present

## 2016-01-12 DIAGNOSIS — H2513 Age-related nuclear cataract, bilateral: Secondary | ICD-10-CM | POA: Diagnosis not present

## 2016-01-12 DIAGNOSIS — E119 Type 2 diabetes mellitus without complications: Secondary | ICD-10-CM | POA: Diagnosis not present

## 2016-01-13 LAB — HEMOGLOBIN A1C: Hemoglobin A1C: 6.4

## 2016-01-13 LAB — LIPID PANEL
CHOLESTEROL: 128 mg/dL (ref 0–200)
HDL: 76 mg/dL — AB (ref 35–70)
LDL Cholesterol: 41 mg/dL
Triglycerides: 54 mg/dL (ref 40–160)

## 2016-01-13 LAB — BASIC METABOLIC PANEL: Glucose: 117 mg/dL

## 2016-01-13 LAB — TSH: TSH: 3.29 u[IU]/mL (ref 0.41–5.90)

## 2016-01-14 ENCOUNTER — Other Ambulatory Visit (HOSPITAL_COMMUNITY): Payer: Medicare Other

## 2016-01-15 ENCOUNTER — Telehealth (HOSPITAL_COMMUNITY): Payer: Self-pay | Admitting: *Deleted

## 2016-01-15 NOTE — Telephone Encounter (Signed)
Patient given detailed instructions per Stress Test Requisition Sheet for test on 01/18/16 at 7:30.Patient Notified to arrive 30 minutes early, and that it is imperative to arrive on time for appointment to keep from having the test rescheduled.  Patient verbalized understanding. Alex Meyers

## 2016-01-18 ENCOUNTER — Other Ambulatory Visit (HOSPITAL_COMMUNITY): Payer: Medicare Other

## 2016-01-18 ENCOUNTER — Other Ambulatory Visit (INDEPENDENT_AMBULATORY_CARE_PROVIDER_SITE_OTHER): Payer: Medicare Other

## 2016-01-18 DIAGNOSIS — R0989 Other specified symptoms and signs involving the circulatory and respiratory systems: Secondary | ICD-10-CM

## 2016-01-20 DIAGNOSIS — E7800 Pure hypercholesterolemia, unspecified: Secondary | ICD-10-CM | POA: Diagnosis not present

## 2016-01-20 DIAGNOSIS — E1165 Type 2 diabetes mellitus with hyperglycemia: Secondary | ICD-10-CM | POA: Diagnosis not present

## 2016-01-21 ENCOUNTER — Ambulatory Visit (HOSPITAL_COMMUNITY)
Admission: RE | Admit: 2016-01-21 | Discharge: 2016-01-21 | Disposition: A | Discharge status: Home / Self Care | Payer: Medicare Other | Source: Ambulatory Visit | Attending: Cardiovascular Disease | Admitting: Cardiovascular Disease

## 2016-01-21 DIAGNOSIS — R9431 Abnormal electrocardiogram [ECG] [EKG]: Secondary | ICD-10-CM | POA: Insufficient documentation

## 2016-01-21 LAB — ECHOCARDIOGRAM STRESS TEST
CHL CUP MPHR: 147 {beats}/min
CHL RATE OF PERCEIVED EXERTION: 17
CSEPEDS: 45 s
Estimated workload: 17.2 METS
Exercise duration (min): 20 min
Peak HR: 129 {beats}/min
Percent HR: 87 %
Rest HR: 64 {beats}/min

## 2016-01-22 ENCOUNTER — Ambulatory Visit (INDEPENDENT_AMBULATORY_CARE_PROVIDER_SITE_OTHER): Payer: Medicare Other | Admitting: Cardiovascular Disease

## 2016-01-22 ENCOUNTER — Encounter: Payer: Self-pay | Admitting: Cardiovascular Disease

## 2016-01-22 VITALS — BP 104/56 | HR 65 | Ht 72.0 in | Wt 164.8 lb

## 2016-01-22 DIAGNOSIS — E1165 Type 2 diabetes mellitus with hyperglycemia: Secondary | ICD-10-CM | POA: Diagnosis not present

## 2016-01-22 DIAGNOSIS — E7800 Pure hypercholesterolemia, unspecified: Secondary | ICD-10-CM | POA: Diagnosis not present

## 2016-01-22 DIAGNOSIS — I2510 Atherosclerotic heart disease of native coronary artery without angina pectoris: Secondary | ICD-10-CM | POA: Diagnosis not present

## 2016-01-22 DIAGNOSIS — E1169 Type 2 diabetes mellitus with other specified complication: Secondary | ICD-10-CM | POA: Diagnosis not present

## 2016-01-22 DIAGNOSIS — E042 Nontoxic multinodular goiter: Secondary | ICD-10-CM | POA: Diagnosis not present

## 2016-01-22 NOTE — Patient Instructions (Addendum)
Medication Instructions:  Your physician recommends that you continue on your current medications as directed. Please refer to the Current Medication list given to you today.  Labwork: No new orders.   Testing/Procedures: No new orders.   Follow-Up: Your physician wants you to follow-up in: June 2018 with Dr Burt Knack.  You will receive a reminder letter in the mail two months in advance. If you don't receive a letter, please call our office to schedule the follow-up appointment.   Any Other Special Instructions Will Be Listed Below (If Applicable).     If you need a refill on your cardiac medications before your next appointment, please call your pharmacy.

## 2016-01-23 NOTE — Progress Notes (Signed)
Cardiology Office Note Date:  01/23/2016   ID:  Alex Meyers, DOB 11-02-1940, MRN [id number]  PCP:  Scarlette Calico, MD  Cardiologist:  Sherren Mocha, MD    Chief Complaint  Patient presents with  . Follow-up     History of Present Illness: Alex Meyers is a 75 y.o. male who presents for Follow-up evaluation after recently undergoing a stress echocardiogram 01/21/2016. The patient has coronary artery disease and he underwent stenting of the LAD and diagonal branches in 2010 after presenting with exertional angina and an abnormal nuclear scan. He was noted to have anteroseptal Q waves and his recent office visit and a stress echo was arranged. He continues to exercise regularly without exertional symptoms. He specifically denies chest pain or shortness of breath. The patient is here with his daughter today who is visiting from [place].  Past Medical History  Diagnosis Date  . Diabetes mellitus   . Coronary artery disease     s//p DES LAD and Dx1 01/2009  . Multiple thyroid nodules   . Personal history of colonic polyps 08/09/2002    ADENOMATOUS POLYP  . Hyperlipidemia     pt denies  . Hypertension     pt. denies  . Hx of colonic polyps 08/2010    Dr. Hilarie Fredrickson  . Internal hemorrhoids with Grade 2-3 prolapse and bleeding 09/25/2012    Diagnosed by Dr. Linda Hedges via anoscopy   . Diverticulosis   . Tubular adenoma of colon     Past Surgical History  Procedure Laterality Date  . Colonoscopy  multiple  . Coronary stent placement  01/22/2009    right side  . Wrist surgery      right side    Current Outpatient Prescriptions  Medication Sig Dispense Refill  . ACZONE 5 % topical gel Apply 1 application topically daily as needed.     Marland Kitchen aspirin 81 MG tablet Take 1 tablet (81 mg total) by mouth daily. 30 tablet 0  . Canagliflozin (INVOKANA) 300 MG TABS Take 1 tablet by mouth daily. Taking 1/2 tablet    . clopidogrel (PLAVIX) 75 MG tablet TAKE 1 TABLET BY MOUTH DAILY 30 tablet 11    . meloxicam (MOBIC) 15 MG tablet TAKE 1 TABLET BY MOUTH EVERY DAY 30 tablet 0  . metFORMIN (GLUCOPHAGE-XR) 500 MG 24 hr tablet Take 1 tablet by mouth at bedtime.  12  . pioglitazone (ACTOS) 45 MG tablet Take 45 mg by mouth daily.  11  . rosuvastatin (CRESTOR) 20 MG tablet Take 1 tablet (20 mg total) by mouth daily. 30 tablet 12  . saxagliptin HCl (ONGLYZA) 5 MG TABS tablet Take 1 tablet (5 mg total) by mouth daily. 30 tablet 0  . sildenafil (VIAGRA) 100 MG tablet Take 100 mg by mouth as needed.      No current facility-administered medications for this visit.    Allergies:   Review of patient's allergies indicates no known allergies.   Social History:  The patient  reports that he has never smoked. He has never used smokeless tobacco. He reports that he drinks about 4.2 oz of alcohol per week. He reports that he does not use illicit drugs.   Family History:  The patient's  family history includes Bladder Cancer (age of onset: 28) in his father; Breast cancer in his daughter; Coronary artery disease in his mother; Heart failure in his mother. There is no history of Colon cancer.   ROS:  Please see the history of present illness.  Otherwise, review of systems is positive for watery eyes.  All other systems are reviewed and negative.   PHYSICAL EXAM: VS:  BP 104/56 mmHg  Pulse 65  Ht 6' (1.829 m)  Wt 164 lb 12.8 oz (74.753 kg)  BMI 22.35 kg/m2  SpO2 98% , BMI Body mass index is 22.35 kg/(m^2). Exam not performed today - time spent in discussion.   EKG:  EKG is not ordered today.  Recent Labs: No results found for requested labs within last 365 days.   Lipid Panel     Component Value Date/Time   CHOL 150 09/01/2014 1054   TRIG 79.0 09/01/2014 1054   HDL 74.90 09/01/2014 1054   CHOLHDL 2 09/01/2014 1054   VLDL 15.8 09/01/2014 1054   LDLCALC 59 09/01/2014 1054      Wt Readings from Last 3 Encounters:  01/22/16 164 lb 12.8 oz (74.753 kg)  12/21/15 162 lb (73.483 kg)   07/07/15 164 lb (74.39 kg)     Cardiac Studies Reviewed: Exercise stress echo: Study Conclusions  - Stress ECG conclusions: The stress ECG was negative for ischemia.  [facility] scoring: exercise time of 14 min; maximum ST deviation of 0  mm; no angina; resulting score is 14. This score predicts a low  risk of cardiac events. - Staged echo: There was no diagnostic evidence for stress-induced  ischemia.  ASSESSMENT AND PLAN: CAD, native vessel, without angina: The patient stress echocardiogram suggests an excellent prognosis related to his coronary artery disease. He has outstanding exercise tolerance and no inducible wall motion abnormalities, EKG changes, chest pain, or arrhythmia with exertion. He will continue on his current medical program.  Current medicines are reviewed with the patient today.  The patient does not have concerns regarding medicines.  Labs/ tests ordered today include:  No orders of the defined types were placed in this encounter.    Disposition:   FU one year  Signed, Sherren Mocha, MD  01/23/2016 11:25 PM    [address], [place], [place]  [address] Phone: [phone]; Fax: [phone]

## 2016-02-08 ENCOUNTER — Other Ambulatory Visit: Payer: Self-pay | Admitting: Internal Medicine

## 2016-02-11 DIAGNOSIS — E041 Nontoxic single thyroid nodule: Secondary | ICD-10-CM | POA: Diagnosis not present

## 2016-02-11 DIAGNOSIS — E042 Nontoxic multinodular goiter: Secondary | ICD-10-CM | POA: Diagnosis not present

## 2016-02-17 LAB — HM DIABETES EYE EXAM

## 2016-03-02 DIAGNOSIS — L570 Actinic keratosis: Secondary | ICD-10-CM | POA: Diagnosis not present

## 2016-03-02 DIAGNOSIS — Z85828 Personal history of other malignant neoplasm of skin: Secondary | ICD-10-CM | POA: Diagnosis not present

## 2016-03-02 DIAGNOSIS — D692 Other nonthrombocytopenic purpura: Secondary | ICD-10-CM | POA: Diagnosis not present

## 2016-03-10 ENCOUNTER — Other Ambulatory Visit: Payer: Self-pay | Admitting: Podiatry

## 2016-03-11 ENCOUNTER — Ambulatory Visit: Payer: Medicare Other | Admitting: Internal Medicine

## 2016-03-18 DIAGNOSIS — E1165 Type 2 diabetes mellitus with hyperglycemia: Secondary | ICD-10-CM | POA: Diagnosis not present

## 2016-03-18 DIAGNOSIS — E7800 Pure hypercholesterolemia, unspecified: Secondary | ICD-10-CM | POA: Diagnosis not present

## 2016-04-04 DIAGNOSIS — E1169 Type 2 diabetes mellitus with other specified complication: Secondary | ICD-10-CM | POA: Diagnosis not present

## 2016-04-04 DIAGNOSIS — E042 Nontoxic multinodular goiter: Secondary | ICD-10-CM | POA: Diagnosis not present

## 2016-04-04 DIAGNOSIS — E7800 Pure hypercholesterolemia, unspecified: Secondary | ICD-10-CM | POA: Diagnosis not present

## 2016-04-04 DIAGNOSIS — I2510 Atherosclerotic heart disease of native coronary artery without angina pectoris: Secondary | ICD-10-CM | POA: Diagnosis not present

## 2016-04-04 DIAGNOSIS — E1165 Type 2 diabetes mellitus with hyperglycemia: Secondary | ICD-10-CM | POA: Diagnosis not present

## 2016-04-04 DIAGNOSIS — Z23 Encounter for immunization: Secondary | ICD-10-CM | POA: Diagnosis not present

## 2016-05-23 ENCOUNTER — Encounter: Payer: Self-pay | Admitting: Internal Medicine

## 2016-05-23 ENCOUNTER — Ambulatory Visit (INDEPENDENT_AMBULATORY_CARE_PROVIDER_SITE_OTHER): Payer: Medicare Other | Admitting: Internal Medicine

## 2016-05-23 ENCOUNTER — Other Ambulatory Visit: Payer: Medicare Other

## 2016-05-23 VITALS — BP 110/70 | HR 68 | Temp 97.7°F | Resp 16 | Ht 72.0 in | Wt 172.5 lb

## 2016-05-23 DIAGNOSIS — Z23 Encounter for immunization: Secondary | ICD-10-CM

## 2016-05-23 DIAGNOSIS — E785 Hyperlipidemia, unspecified: Secondary | ICD-10-CM | POA: Diagnosis not present

## 2016-05-23 DIAGNOSIS — I2510 Atherosclerotic heart disease of native coronary artery without angina pectoris: Secondary | ICD-10-CM

## 2016-05-23 DIAGNOSIS — E118 Type 2 diabetes mellitus with unspecified complications: Secondary | ICD-10-CM | POA: Diagnosis not present

## 2016-05-23 DIAGNOSIS — I10 Essential (primary) hypertension: Secondary | ICD-10-CM | POA: Diagnosis not present

## 2016-05-23 DIAGNOSIS — Z0000 Encounter for general adult medical examination without abnormal findings: Secondary | ICD-10-CM | POA: Diagnosis not present

## 2016-05-23 DIAGNOSIS — N400 Enlarged prostate without lower urinary tract symptoms: Secondary | ICD-10-CM

## 2016-05-23 MED ORDER — ZOSTER VACCINE LIVE 19400 UNT/0.65ML ~~LOC~~ SUSR: 0.6500 mL | Freq: Once | SUBCUTANEOUS | 0 refills | Status: AC

## 2016-05-23 NOTE — Progress Notes (Signed)
Subjective:  Patient ID: Alex Meyers, male    DOB: 12-13-1940  Age: 75 y.o. MRN: [id number]  CC: Annual Exam; Diabetes; Hyperlipidemia; and Hypertension   HPI Alex Meyers presents for an AWV/CPX.  He has had a few episodes of rectal pain recently and tells me that he has an upcoming appointment soon with a gastroenterologist. He is followed closely by endocrinology but has never seen a urologist. He otherwise feels well today and offers no complaints.  Past Medical History:  Diagnosis Date  . Coronary artery disease    s//p DES LAD and Dx1 01/2009  . Diabetes mellitus   . Diverticulosis   . Hx of colonic polyps 08/2010   Dr. Hilarie Fredrickson  . Hyperlipidemia    pt denies  . Hypertension    pt. denies  . Internal hemorrhoids with Grade 2-3 prolapse and bleeding 09/25/2012   Diagnosed by Dr. Linda Hedges via anoscopy   . Multiple thyroid nodules   . Personal history of colonic polyps 08/09/2002   ADENOMATOUS POLYP  . Tubular adenoma of colon    Past Surgical History:  Procedure Laterality Date  . COLONOSCOPY  multiple  . CORONARY STENT PLACEMENT  01/22/2009   right side  . WRIST SURGERY     right side    reports that he has never smoked. He has never used smokeless tobacco. He reports that he drinks about 4.2 oz of alcohol per week . He reports that he does not use drugs. family history includes Bladder Cancer (age of onset: 70) in his father; Breast cancer in his daughter; Coronary artery disease in his mother; Heart failure in his mother. No Known Allergies  Outpatient Medications Prior to Visit  Medication Sig Dispense Refill  . ACZONE 5 % topical gel Apply 1 application topically daily as needed.     Marland Kitchen aspirin 81 MG tablet Take 1 tablet (81 mg total) by mouth daily. 30 tablet 0  . Canagliflozin (INVOKANA) 300 MG TABS Take 1 tablet by mouth daily. Taking 1/2 tablet    . clopidogrel (PLAVIX) 75 MG tablet TAKE 1 TABLET BY MOUTH DAILY 30 tablet 11  . meloxicam (MOBIC) 15 MG tablet  TAKE 1 TABLET BY MOUTH EVERY DAY 90 tablet 0  . metFORMIN (GLUCOPHAGE-XR) 500 MG 24 hr tablet Take 1 tablet by mouth at bedtime.  12  . pioglitazone (ACTOS) 45 MG tablet Take 45 mg by mouth daily.  11  . rosuvastatin (CRESTOR) 20 MG tablet Take 1 tablet (20 mg total) by mouth daily. 30 tablet 12  . saxagliptin HCl (ONGLYZA) 5 MG TABS tablet Take 1 tablet (5 mg total) by mouth daily. 30 tablet 0  . sildenafil (VIAGRA) 100 MG tablet Take 100 mg by mouth as needed.      No facility-administered medications prior to visit.     ROS Review of Systems  Constitutional: Negative for activity change, appetite change, diaphoresis, fatigue and unexpected weight change.  HENT: Negative.  Negative for trouble swallowing and voice change.   Eyes: Negative.  Negative for visual disturbance.  Respiratory: Negative for cough, choking, chest tightness, shortness of breath and stridor.   Cardiovascular: Negative.  Negative for chest pain, palpitations and leg swelling.  Gastrointestinal: Positive for rectal pain. Negative for abdominal pain, anal bleeding, blood in stool, constipation, diarrhea, nausea and vomiting.  Endocrine: Negative.  Negative for cold intolerance, polydipsia, polyphagia and polyuria.  Genitourinary: Negative.  Negative for difficulty urinating, dysuria, flank pain, frequency, hematuria, penile swelling, scrotal swelling,  testicular pain and urgency.  Musculoskeletal: Negative.  Negative for back pain, myalgias and neck pain.  Skin: Negative.  Negative for color change and rash.  Allergic/Immunologic: Negative.   Neurological: Negative.   Hematological: Negative.  Negative for adenopathy. Does not bruise/bleed easily.  Psychiatric/Behavioral: Negative.     Objective:  BP 110/70 (BP Location: Left Arm, Patient Position: Sitting, Cuff Size: Normal)   Pulse 68   Temp 97.7 F (36.5 C) (Oral)   Resp 16   Ht 6' (1.829 m)   Wt 172 lb 8 oz (78.2 kg)   SpO2 98%   BMI 23.40 kg/m   BP  Readings from Last 3 Encounters:  05/23/16 110/70  01/22/16 (!) 104/56  12/21/15 112/60    Wt Readings from Last 3 Encounters:  05/23/16 172 lb 8 oz (78.2 kg)  01/22/16 164 lb 12.8 oz (74.8 kg)  12/21/15 162 lb (73.5 kg)    Physical Exam  Constitutional: He is oriented to person, place, and time. He appears well-developed and well-nourished. No distress.  HENT:  Head: Normocephalic and atraumatic.  Mouth/Throat: Oropharynx is clear and moist. No oropharyngeal exudate.  Eyes: Conjunctivae are normal. Right eye exhibits no discharge. Left eye exhibits no discharge. No scleral icterus.  Neck: Normal range of motion. Neck supple. No JVD present. No tracheal deviation present. No thyromegaly present.  Cardiovascular: Normal rate, regular rhythm, normal heart sounds and intact distal pulses.  Exam reveals no gallop and no friction rub.   No murmur heard. Pulmonary/Chest: Effort normal and breath sounds normal. No stridor. No respiratory distress. He has no wheezes. He has no rales. He exhibits no tenderness.  Abdominal: Soft. Bowel sounds are normal. He exhibits no distension and no mass. There is no tenderness. There is no rebound and no guarding. Hernia confirmed negative in the right inguinal area and confirmed negative in the left inguinal area.  Genitourinary: Prostate normal, testes normal and penis normal. Rectal exam shows external hemorrhoid and internal hemorrhoid. Rectal exam shows no fissure, no mass, no tenderness, anal tone normal and guaiac negative stool. Prostate is not enlarged and not tender. Right testis shows no mass, no swelling and no tenderness. Right testis is descended. Left testis shows no mass, no swelling and no tenderness. Circumcised. No penile erythema or penile tenderness. No discharge found.  Genitourinary Comments: There are uncomplicated internal and external anal hemorrhoids  Musculoskeletal: Normal range of motion. He exhibits no edema, tenderness or  deformity.  Lymphadenopathy:    He has no cervical adenopathy.       Right: No inguinal adenopathy present.       Left: No inguinal adenopathy present.  Neurological: He is oriented to person, place, and time.  Skin: Skin is warm and dry. No rash noted. He is not diaphoretic. No erythema. No pallor.  Psychiatric: He has a normal mood and affect. His behavior is normal. Judgment and thought content normal.  Vitals reviewed.   Lab Results  Component Value Date   WBC 4.4 05/24/2016   HGB 13.3 05/24/2016   HCT 39.4 05/24/2016   PLT 120.0 (L) 05/24/2016   GLUCOSE 131 (H) 05/24/2016   CHOL 127 05/24/2016   TRIG 63.0 05/24/2016   HDL 71.00 05/24/2016   LDLCALC 44 05/24/2016   ALT 15 05/24/2016   AST 18 05/24/2016   NA 142 05/24/2016   K 4.4 05/24/2016   CL 107 05/24/2016   CREATININE 1.01 05/24/2016   BUN 19 05/24/2016   CO2 27 05/24/2016  TSH 2.69 05/24/2016   PSA 0.35 05/24/2016   INR 1.2 ratio (H) 01/21/2009   HGBA1C 6.6 (H) 05/24/2016   MICROALBUR 0.8 05/24/2016    No results found.  Assessment & Plan:   Sheridan was seen today for annual exam.  Diagnoses and all orders for this visit:  Atherosclerosis of native coronary artery of native heart without angina pectoris- he said no recent episodes of chest pain. Will continue risk factor modification with antiplatelet therapy and statin therapy. -     Lipid panel; Future  Essential hypertension- his blood pressure is adequately well controlled, electrolytes and renal function are stable. -     CBC with Differential/Platelet; Future -     Urinalysis, Routine w reflex microscopic (not at [facility]); Future  Diabetes mellitus with complication ([place])- his 123456 is at 6.6%,  His blood sugars are adequately well-controlled. -     Comprehensive metabolic panel; Future -     Microalbumin / creatinine urine ratio; Future -     Hemoglobin A1c; Future  Hyperlipidemia with target LDL less than 70- he has achieved his LDL goal is doing  well on the statin. -     Comprehensive metabolic panel; Future -     TSH; Future -     Lipid panel; Future  Benign prostatic hyperplasia without lower urinary tract symptoms- His PSA has not risen so I am not concerned about prostate cancer. He has no symptoms that need to be treated. -     PSA; Future  Routine general medical examination at a health care facility -     Zoster Vaccine Live, PF, (ZOSTAVAX) 53664 UNT/0.65ML injection; Inject 19,400 Units into the skin once.   I am having Mr. Large start on Zoster Vaccine Live (PF). I am also having him maintain his aspirin, rosuvastatin, saxagliptin HCl, sildenafil, ACZONE, canagliflozin, pioglitazone, metFORMIN, clopidogrel, and meloxicam.  Meds ordered this encounter  Medications  . Zoster Vaccine Live, PF, (ZOSTAVAX) [id number] UNT/0.65ML injection    Sig: Inject 19,400 Units into the skin once.    Dispense:  1 each    Refill:  0   See AVS for instructions about healthy living and anticipatory guidance.  Follow-up: Return in about 6 months (around 11/20/2016).  Scarlette Calico, MD

## 2016-05-23 NOTE — Patient Instructions (Signed)

## 2016-05-23 NOTE — Progress Notes (Signed)
Pre visit review using our clinic review tool, if applicable. No additional management support is needed unless otherwise documented below in the visit note. 

## 2016-05-24 ENCOUNTER — Other Ambulatory Visit (INDEPENDENT_AMBULATORY_CARE_PROVIDER_SITE_OTHER): Payer: Medicare Other

## 2016-05-24 ENCOUNTER — Encounter: Payer: Self-pay | Admitting: Internal Medicine

## 2016-05-24 DIAGNOSIS — E785 Hyperlipidemia, unspecified: Secondary | ICD-10-CM | POA: Diagnosis not present

## 2016-05-24 DIAGNOSIS — N400 Enlarged prostate without lower urinary tract symptoms: Secondary | ICD-10-CM | POA: Diagnosis not present

## 2016-05-24 DIAGNOSIS — E118 Type 2 diabetes mellitus with unspecified complications: Secondary | ICD-10-CM

## 2016-05-24 DIAGNOSIS — I2510 Atherosclerotic heart disease of native coronary artery without angina pectoris: Secondary | ICD-10-CM

## 2016-05-24 DIAGNOSIS — I10 Essential (primary) hypertension: Secondary | ICD-10-CM | POA: Diagnosis not present

## 2016-05-24 LAB — COMPREHENSIVE METABOLIC PANEL
ALT: 15 U/L (ref 0–53)
AST: 18 U/L (ref 0–37)
Albumin: 3.9 g/dL (ref 3.5–5.2)
Alkaline Phosphatase: 43 U/L (ref 39–117)
BUN: 19 mg/dL (ref 6–23)
CALCIUM: 9.2 mg/dL (ref 8.4–10.5)
CHLORIDE: 107 meq/L (ref 96–112)
CO2: 27 meq/L (ref 19–32)
Creatinine, Ser: 1.01 mg/dL (ref 0.40–1.50)
GFR: 76.88 mL/min (ref >60.00)
Glucose, Bld: 131 mg/dL — ABNORMAL HIGH (ref 70–99)
POTASSIUM: 4.4 meq/L (ref 3.5–5.1)
Sodium: 142 mEq/L (ref 135–145)
Total Bilirubin: 1 mg/dL (ref 0.2–1.2)
Total Protein: 6 g/dL (ref 6.0–8.3)

## 2016-05-24 LAB — CBC WITH DIFFERENTIAL/PLATELET
BASOS PCT: 0.6 % (ref 0.0–3.0)
Basophils Absolute: 0 10*3/uL (ref 0.0–0.1)
EOS ABS: 0.1 10*3/uL (ref 0.0–0.7)
Eosinophils Relative: 1.7 % (ref 0.0–5.0)
HEMATOCRIT: 39.4 % (ref 39.0–52.0)
Hemoglobin: 13.3 g/dL (ref 13.0–17.0)
LYMPHS PCT: 27.8 % (ref 12.0–46.0)
Lymphs Abs: 1.2 10*3/uL (ref 0.7–4.0)
MCHC: 33.8 g/dL (ref 30.0–36.0)
MCV: 87.7 fl (ref 78.0–100.0)
Monocytes Absolute: 0.5 10*3/uL (ref 0.1–1.0)
Monocytes Relative: 10.5 % (ref 3.0–12.0)
NEUTROS ABS: 2.6 10*3/uL (ref 1.4–7.7)
Neutrophils Relative %: 59.4 % (ref 43.0–77.0)
PLATELETS: 120 10*3/uL — AB (ref 150.0–400.0)
RBC: 4.5 Mil/uL (ref 4.22–5.81)
RDW: 15.9 % — AB (ref 11.5–15.5)
WBC: 4.4 10*3/uL (ref 4.0–10.5)

## 2016-05-24 LAB — URINALYSIS, ROUTINE W REFLEX MICROSCOPIC
BILIRUBIN URINE: NEGATIVE
Hgb urine dipstick: NEGATIVE
KETONES UR: NEGATIVE
LEUKOCYTES UA: NEGATIVE
NITRITE: NEGATIVE
PH: 5.5 (ref 5.0–8.0)
RBC / HPF: NONE SEEN (ref 0–?)
SPECIFIC GRAVITY, URINE: 1.02 (ref 1.000–1.030)
TOTAL PROTEIN, URINE-UPE24: NEGATIVE
Urine Glucose: >=1000 — AB
Urobilinogen, UA: 0.2 (ref 0.0–1.0)

## 2016-05-24 LAB — LIPID PANEL
CHOL/HDL RATIO: 2
CHOLESTEROL: 127 mg/dL (ref 0–200)
HDL: 71 mg/dL (ref >39.00)
LDL CALC: 44 mg/dL (ref 0–99)
NonHDL: 56.27
TRIGLYCERIDES: 63 mg/dL (ref 0.0–149.0)
VLDL: 12.6 mg/dL (ref 0.0–40.0)

## 2016-05-24 LAB — MICROALBUMIN / CREATININE URINE RATIO
Creatinine,U: 117.4 mg/dL
MICROALB/CREAT RATIO: 0.7 mg/g (ref 0.0–30.0)
Microalb, Ur: 0.8 mg/dL (ref 0.0–1.9)

## 2016-05-24 LAB — HEMOGLOBIN A1C: Hgb A1c MFr Bld: 6.6 % — ABNORMAL HIGH (ref 4.6–6.5)

## 2016-05-24 LAB — TSH: TSH: 2.69 u[IU]/mL (ref 0.35–4.50)

## 2016-05-24 LAB — PSA: PSA: 0.35 ng/mL (ref 0.10–4.00)

## 2016-05-29 NOTE — Assessment & Plan Note (Signed)

## 2016-06-08 DIAGNOSIS — E1165 Type 2 diabetes mellitus with hyperglycemia: Secondary | ICD-10-CM | POA: Diagnosis not present

## 2016-06-08 DIAGNOSIS — E7800 Pure hypercholesterolemia, unspecified: Secondary | ICD-10-CM | POA: Diagnosis not present

## 2016-06-10 DIAGNOSIS — E7800 Pure hypercholesterolemia, unspecified: Secondary | ICD-10-CM | POA: Diagnosis not present

## 2016-06-10 DIAGNOSIS — E042 Nontoxic multinodular goiter: Secondary | ICD-10-CM | POA: Diagnosis not present

## 2016-06-10 DIAGNOSIS — E1169 Type 2 diabetes mellitus with other specified complication: Secondary | ICD-10-CM | POA: Diagnosis not present

## 2016-06-10 DIAGNOSIS — E1165 Type 2 diabetes mellitus with hyperglycemia: Secondary | ICD-10-CM | POA: Diagnosis not present

## 2016-06-10 DIAGNOSIS — I2510 Atherosclerotic heart disease of native coronary artery without angina pectoris: Secondary | ICD-10-CM | POA: Diagnosis not present

## 2016-06-10 DIAGNOSIS — Z23 Encounter for immunization: Secondary | ICD-10-CM | POA: Diagnosis not present

## 2016-06-14 ENCOUNTER — Ambulatory Visit (INDEPENDENT_AMBULATORY_CARE_PROVIDER_SITE_OTHER): Payer: Medicare Other | Admitting: Internal Medicine

## 2016-06-14 ENCOUNTER — Encounter: Payer: Self-pay | Admitting: Internal Medicine

## 2016-06-14 VITALS — BP 78/54 | HR 72 | Ht 72.0 in | Wt 164.5 lb

## 2016-06-14 DIAGNOSIS — Z86010 Personal history of colonic polyps: Secondary | ICD-10-CM | POA: Diagnosis not present

## 2016-06-14 DIAGNOSIS — K6289 Other specified diseases of anus and rectum: Secondary | ICD-10-CM

## 2016-06-14 DIAGNOSIS — K5900 Constipation, unspecified: Secondary | ICD-10-CM | POA: Diagnosis not present

## 2016-06-14 NOTE — Patient Instructions (Signed)
You will be due for a recall colonoscopy in 01/2018. We will send you a reminder in the mail when it gets closer to that time.  Please follow up with Dr Hilarie Fredrickson as needed.  If you are age 73 or older, your body mass index should be between 23-30. Your Body mass index is 22.31 kg/m. If this is out of the aforementioned range listed, please consider follow up with your Primary Care Provider.  If you are age 58 or younger, your body mass index should be between 19-25. Your Body mass index is 22.31 kg/m. If this is out of the aformentioned range listed, please consider follow up with your Primary Care Provider.

## 2016-06-14 NOTE — Progress Notes (Signed)
   Subjective:    Patient ID: Alex Meyers, male    DOB: [birth year], 75 y.o.   MRN: [id number]  HPI Alex Meyers is a 75 year old male with history of adenomatous colon polyps, internal hemorrhoids status post banding, CAD with PCI in July 2010 on Plavix, hypertension, hyperlipidemia and diabetes is here for follow-up. He was last seen at the time of surveillance colonoscopy on 01/08/2015. This revealed 3 polyps which were removed measuring in size from 3-5 mm. Pathology was consistent with tubular adenoma and benign colorectal mucosa. He reports he is feeling well. Of the time he made the appointment he had had several days of rectal discomfort which is difficult for and described. Not sure this had related to bowel movement. He can at times feel constipation or hard or stool. He notes that prior to his PCI in July 2010 never had constipation and had loose stools. After finding out about coronary artery disease he changed his lifestyle dramatically lost weight, changed his diet and medications were added. Since this time he said absolutely no loose stools. He denies rectal bleeding. No recent rectal pain. No tenesmus. No abdominal pain. Feeling well. Continues to run as many as 15-20 miles per week. Recently returned from [place] where he saw his daughter perform in an off [place].  He notes he was taking Colace at night for about 6 months but stopped because he didn't feel like he needed it anymore  Review of Systems As per history of present illness, otherwise negative  Current Medications, Allergies, Past Medical History, Past Surgical History, Family History and Social History were reviewed in [employer] record.     Objective:   Physical Exam BP (!) 78/54   Pulse 72   Ht 6' (1.829 m)   Wt 164 lb 8 oz (74.6 kg)   BMI 22.31 kg/m  Constitutional: Well-developed and well-nourished. No distress. HEENT: Normocephalic and atraumatic. Oropharynx is clear  and moist. No oropharyngeal exudate. Conjunctivae are normal.  No scleral icterus. Neck: Neck supple. Trachea midline. Cardiovascular: Normal rate, regular rhythm and intact distal pulses. No M/R/G Pulmonary/chest: Effort normal and breath sounds normal. No wheezing, rales or rhonchi. Abdominal: Soft, nontender, nondistended. Bowel sounds active throughout. There are no masses palpable. No hepatosplenomegaly. Extremities: no clubbing, cyanosis, or edema Lymphadenopathy: No cervical adenopathy noted. Neurological: Alert and oriented to person place and time. Skin: Skin is warm and dry. No rashes noted. Psychiatric: Normal mood and affect. Behavior is normal.      Assessment & Plan:  75 year old male with history of adenomatous colon polyps, internal hemorrhoids status post banding, CAD with PCI in July 2010 on Plavix, hypertension, hyperlipidemia and diabetes is here for follow-up.   1. History of adenomatous polyps -- surveillance recommended July 2019  2. Rectal pain -- none in the last 3-4 months. Very rare and intermittent prior to this. No treatment recommended due to the fact this is not occurring now. Notify me if it becomes recurrent or persistent.  3. Mild constipation -- we discussed the importance of hydration and drinking plenty of fluids. I suggested he had Colace back at bedtime 100 mg.  4. CAD -- on aspirin, Plavix under the direction of Dr. Burt Knack.  Follow-up as needed and for colonoscopy in summer of 2019 15 minutes spent with the patient today. Greater than 50% was spent in counseling and coordination of care with the patient

## 2016-07-01 DIAGNOSIS — H16223 Keratoconjunctivitis sicca, not specified as Sjogren's, bilateral: Secondary | ICD-10-CM | POA: Diagnosis not present

## 2016-07-01 DIAGNOSIS — H01024 Squamous blepharitis left upper eyelid: Secondary | ICD-10-CM | POA: Diagnosis not present

## 2016-07-01 DIAGNOSIS — H10413 Chronic giant papillary conjunctivitis, bilateral: Secondary | ICD-10-CM | POA: Diagnosis not present

## 2016-07-01 DIAGNOSIS — H01021 Squamous blepharitis right upper eyelid: Secondary | ICD-10-CM | POA: Diagnosis not present

## 2016-07-01 DIAGNOSIS — E119 Type 2 diabetes mellitus without complications: Secondary | ICD-10-CM | POA: Diagnosis not present

## 2016-07-01 DIAGNOSIS — H2513 Age-related nuclear cataract, bilateral: Secondary | ICD-10-CM | POA: Diagnosis not present

## 2016-07-01 DIAGNOSIS — H01022 Squamous blepharitis right lower eyelid: Secondary | ICD-10-CM | POA: Diagnosis not present

## 2016-07-01 DIAGNOSIS — H01025 Squamous blepharitis left lower eyelid: Secondary | ICD-10-CM | POA: Diagnosis not present

## 2016-07-06 ENCOUNTER — Other Ambulatory Visit: Payer: Self-pay | Admitting: Podiatry

## 2016-07-07 NOTE — Telephone Encounter (Signed)
Pt needs an appt prior to future refills. 

## 2016-08-11 ENCOUNTER — Encounter: Payer: Self-pay | Admitting: Internal Medicine

## 2016-08-11 NOTE — Progress Notes (Signed)
Result abstracted 

## 2016-08-23 DIAGNOSIS — E7800 Pure hypercholesterolemia, unspecified: Secondary | ICD-10-CM | POA: Diagnosis not present

## 2016-08-23 DIAGNOSIS — I2510 Atherosclerotic heart disease of native coronary artery without angina pectoris: Secondary | ICD-10-CM | POA: Diagnosis not present

## 2016-08-23 DIAGNOSIS — E042 Nontoxic multinodular goiter: Secondary | ICD-10-CM | POA: Diagnosis not present

## 2016-08-23 DIAGNOSIS — E1159 Type 2 diabetes mellitus with other circulatory complications: Secondary | ICD-10-CM | POA: Diagnosis not present

## 2016-08-30 DIAGNOSIS — Z85828 Personal history of other malignant neoplasm of skin: Secondary | ICD-10-CM | POA: Diagnosis not present

## 2016-08-30 DIAGNOSIS — D2261 Melanocytic nevi of right upper limb, including shoulder: Secondary | ICD-10-CM | POA: Diagnosis not present

## 2016-08-30 DIAGNOSIS — L608 Other nail disorders: Secondary | ICD-10-CM | POA: Diagnosis not present

## 2016-08-30 DIAGNOSIS — L821 Other seborrheic keratosis: Secondary | ICD-10-CM | POA: Diagnosis not present

## 2016-08-30 DIAGNOSIS — D692 Other nonthrombocytopenic purpura: Secondary | ICD-10-CM | POA: Diagnosis not present

## 2016-08-31 DIAGNOSIS — H10413 Chronic giant papillary conjunctivitis, bilateral: Secondary | ICD-10-CM | POA: Diagnosis not present

## 2016-08-31 DIAGNOSIS — H01021 Squamous blepharitis right upper eyelid: Secondary | ICD-10-CM | POA: Diagnosis not present

## 2016-08-31 DIAGNOSIS — H04123 Dry eye syndrome of bilateral lacrimal glands: Secondary | ICD-10-CM | POA: Diagnosis not present

## 2016-08-31 DIAGNOSIS — H01022 Squamous blepharitis right lower eyelid: Secondary | ICD-10-CM | POA: Diagnosis not present

## 2016-09-01 DIAGNOSIS — E7800 Pure hypercholesterolemia, unspecified: Secondary | ICD-10-CM | POA: Diagnosis not present

## 2016-09-01 DIAGNOSIS — E1100 Type 2 diabetes mellitus with hyperosmolarity without nonketotic hyperglycemic-hyperosmolar coma (NKHHC): Secondary | ICD-10-CM | POA: Diagnosis not present

## 2016-09-12 DIAGNOSIS — L570 Actinic keratosis: Secondary | ICD-10-CM | POA: Diagnosis not present

## 2016-10-19 ENCOUNTER — Other Ambulatory Visit: Payer: Self-pay | Admitting: Podiatry

## 2016-10-20 NOTE — Telephone Encounter (Signed)
Pt needs an appt prior to future refills. 

## 2016-10-21 DIAGNOSIS — E7800 Pure hypercholesterolemia, unspecified: Secondary | ICD-10-CM | POA: Diagnosis not present

## 2016-10-21 DIAGNOSIS — E1159 Type 2 diabetes mellitus with other circulatory complications: Secondary | ICD-10-CM | POA: Diagnosis not present

## 2016-11-16 ENCOUNTER — Other Ambulatory Visit: Payer: Self-pay | Admitting: Cardiovascular Disease

## 2016-11-21 ENCOUNTER — Telehealth: Payer: Self-pay

## 2016-11-21 ENCOUNTER — Other Ambulatory Visit: Payer: Self-pay | Admitting: Internal Medicine

## 2016-11-21 DIAGNOSIS — N529 Male erectile dysfunction, unspecified: Secondary | ICD-10-CM

## 2016-11-21 MED ORDER — SILDENAFIL CITRATE 100 MG PO TABS: 100.0000 mg | ORAL_TABLET | ORAL | 11 refills | Status: DC | PRN

## 2016-11-21 NOTE — Telephone Encounter (Signed)
Pt is requesting refill of Viagra sent to [facility] on [address]  Please advise

## 2016-12-02 DIAGNOSIS — E7800 Pure hypercholesterolemia, unspecified: Secondary | ICD-10-CM | POA: Diagnosis not present

## 2016-12-02 DIAGNOSIS — E042 Nontoxic multinodular goiter: Secondary | ICD-10-CM | POA: Diagnosis not present

## 2016-12-02 DIAGNOSIS — I2510 Atherosclerotic heart disease of native coronary artery without angina pectoris: Secondary | ICD-10-CM | POA: Diagnosis not present

## 2016-12-02 DIAGNOSIS — E1159 Type 2 diabetes mellitus with other circulatory complications: Secondary | ICD-10-CM | POA: Diagnosis not present

## 2016-12-06 DIAGNOSIS — Z85828 Personal history of other malignant neoplasm of skin: Secondary | ICD-10-CM | POA: Diagnosis not present

## 2016-12-06 DIAGNOSIS — L853 Xerosis cutis: Secondary | ICD-10-CM | POA: Diagnosis not present

## 2016-12-06 DIAGNOSIS — L570 Actinic keratosis: Secondary | ICD-10-CM | POA: Diagnosis not present

## 2016-12-06 DIAGNOSIS — D692 Other nonthrombocytopenic purpura: Secondary | ICD-10-CM | POA: Diagnosis not present

## 2016-12-21 ENCOUNTER — Telehealth: Payer: Self-pay | Admitting: *Deleted

## 2016-12-21 ENCOUNTER — Telehealth: Payer: Self-pay | Admitting: Cardiovascular Disease

## 2016-12-21 NOTE — Telephone Encounter (Signed)
Rec'd call pt states he would like rx for Revatio sent to [place] it is much cheaper there than the viagra. Pls advise...Johny Chess

## 2016-12-21 NOTE — Telephone Encounter (Signed)
NEW MESSAGE   *STAT* If patient is at the pharmacy, call can be transferred to refill team.   1. Which medications need to be refilled? (please list name of each medication and dose if known)  REVATIO  VIAGRA  2. Which pharmacy/location (including street and city if local pharmacy) is medication to be sent to? [facility]  3. Do they need a 30 day or 90 day supply?  PT DID NOT SAY  *PT REQUESTED TO BE CALLED WHEN RX'S HAVE BEEN SENT IN

## 2016-12-21 NOTE — Telephone Encounter (Signed)
Called pt to inform him that Dr. Scarlette Calico sent his Rx to his pharmacy with refills, to [facility] and that he could request that [place] have this Rx transferred from [facility]. I advised the pt that if he has any other problems, questions or concerns to call the office. Pt verbalized understanding.

## 2016-12-22 ENCOUNTER — Other Ambulatory Visit: Payer: Self-pay | Admitting: Internal Medicine

## 2016-12-22 DIAGNOSIS — N529 Male erectile dysfunction, unspecified: Secondary | ICD-10-CM

## 2016-12-22 MED ORDER — SILDENAFIL CITRATE 20 MG PO TABS: 80.0000 mg | ORAL_TABLET | Freq: Every day | ORAL | 11 refills | Status: DC | PRN

## 2016-12-23 NOTE — Telephone Encounter (Signed)
MD sent rx to [facility].../lmb

## 2017-01-05 DIAGNOSIS — Z85828 Personal history of other malignant neoplasm of skin: Secondary | ICD-10-CM | POA: Diagnosis not present

## 2017-01-05 DIAGNOSIS — L820 Inflamed seborrheic keratosis: Secondary | ICD-10-CM | POA: Diagnosis not present

## 2017-02-01 DIAGNOSIS — E1159 Type 2 diabetes mellitus with other circulatory complications: Secondary | ICD-10-CM | POA: Diagnosis not present

## 2017-02-01 DIAGNOSIS — E7800 Pure hypercholesterolemia, unspecified: Secondary | ICD-10-CM | POA: Diagnosis not present

## 2017-02-01 DIAGNOSIS — E042 Nontoxic multinodular goiter: Secondary | ICD-10-CM | POA: Diagnosis not present

## 2017-02-07 ENCOUNTER — Ambulatory Visit (INDEPENDENT_AMBULATORY_CARE_PROVIDER_SITE_OTHER): Payer: Medicare Other | Admitting: Sports Medicine

## 2017-02-07 ENCOUNTER — Ambulatory Visit: Payer: Self-pay

## 2017-02-07 VITALS — BP 108/68 | Ht 72.0 in | Wt 166.0 lb

## 2017-02-07 DIAGNOSIS — M25561 Pain in right knee: Secondary | ICD-10-CM

## 2017-02-07 NOTE — Assessment & Plan Note (Signed)
See A/ P  Conservative care

## 2017-02-07 NOTE — Progress Notes (Signed)
Chief complaint:  Right knee pain 4 weeks  History of present illness: Alex Meyers is a 76 year old male, who presents to the sports medicine office today with chief complaint of right knee pain. He reports that symptoms have been present for approximately 3-4 weeks now. He reports that symptoms and actually did gradually subside over the last week, but he wanted to keep this appointment to make sure that there is nothing structurally going on with his knee, as well as seeing if he needed any type of bracing that he could use. He points to pain over the inferiomedial aspect of his right patella. He does not report of any inciting incident, trauma, or injury to explain his pain. He reports that approximately 5 weeks ago he did change his running shoes. He reports that he did not notice pain as soon as he switched her running shoes. He does not report of any popping, locking, or catching sensation. He does not report of any warmth, erythema, ecchymosis, or effusion. He does not report of any radiation of pain. Describes pain as intermittent. It does not wake him up at night. It does not bother him first thing in the morning. He reports that he does run approximately 20 miles a week. He reports that he has not ran in the last 3 weeks. He does not report of any previous injury to his right knee.  Past medical history: Hypertension Type 2 diabetes Hyperlipidemia CAD IBS Diverticulosis Internal hemorrhoids  Past surgical history: Coronary stent placement Wrist surgery  Family history: Father-bladder cancer; daughter-breast cancer; CAD and heart failure-mother  Social history: He does not report of any current tobacco use, does report of occasional alcohol use, does not report of any illicit drug use   Allergies:  No known drug allergies  Review of systems: As stated above  Physical exam: Vital signs reviewed and are documented in chart General: Alert, oriented, appears stated age, in no  apparent distress HEENT: Moist oral mucosa Respiratory: Normal respirations, able to speak in full sentences Cardiac: Normal peripheral pulses, regular rate Integumentary: No rashes on visible skin Neurologic: Strength 5/5 in bilateral lower extremity, sensation 2+ in bilateral lower extremity Psychiatric: Appropriate affect and mood Musculoskeletal: Inspection of right knee reveals no obvious deformity or muscular atrophy, no warmth, erythema, ecchymosis, or effusion, he points to being tender along the inferior medial pole of patella, but no actual tenderness to palpation in this region, as well as quadriceps tendon, patellar tendon, medial joint line, lateral joint line, no posterior right knee pain, Lachman, anterior drawer, valgus, and varus stress testing negative, McMurray negative, very minimal crepitation with arc of flexion and extension  Ultrasound of Right Knee   no evidence of quadriceps tendinopathy no evidence of patellar tendinopath  has normal medial and  lateral meniscus,  on proximal patellar tendon slightly medially  - calcific spurring, Only mild increase in doppler flow and no tendon disruption No spurring on left PT  Impression: Proximal patellar calcific spurring on medial aspect of Right patellar tendon  Ultrasound and interpretation by Alex Meyers. Alex Tague, MD    Assessment and plan: 1. Right knee pain, secondary to proximal calcific patellar spurring 2. Thoracic kyphosis  Right knee pain -Discussed ultrasound findings with patient, do see slight spurring on proximal patellar tendon, he does have normal patellar length ruling out suspicion for chronic patellar tendinopathy, he does have normal medial and lateral meniscus, have lower suspicion for degenerative meniscal tear as cause of symptoms -Discussed home physical therapy exercises,  this was printed off and given to him, including H and T stretches, single leg squats, shoulder rowing  Thoracic  kyphosis -Discussed above home physical therapy exercises to correct this and not worsen right knee pain  He will follow-up on as-needed basis if no interval improvement in symptoms.   Mort Sawyers, MD Primary Care Sports Medicine Fellow [facility]  I observed and examined the patient with the resident and agree with assessment and plan.  Note reviewed and modified by me. Stefanie Libel, MD

## 2017-02-08 DIAGNOSIS — E1159 Type 2 diabetes mellitus with other circulatory complications: Secondary | ICD-10-CM | POA: Diagnosis not present

## 2017-02-08 DIAGNOSIS — I2510 Atherosclerotic heart disease of native coronary artery without angina pectoris: Secondary | ICD-10-CM | POA: Diagnosis not present

## 2017-02-08 DIAGNOSIS — E042 Nontoxic multinodular goiter: Secondary | ICD-10-CM | POA: Diagnosis not present

## 2017-02-08 DIAGNOSIS — E7800 Pure hypercholesterolemia, unspecified: Secondary | ICD-10-CM | POA: Diagnosis not present

## 2017-02-21 ENCOUNTER — Other Ambulatory Visit: Payer: Self-pay | Admitting: Cardiovascular Disease

## 2017-02-28 ENCOUNTER — Encounter: Payer: Self-pay | Admitting: *Deleted

## 2017-03-20 ENCOUNTER — Ambulatory Visit (INDEPENDENT_AMBULATORY_CARE_PROVIDER_SITE_OTHER): Payer: Medicare Other | Admitting: Cardiovascular Disease

## 2017-03-20 ENCOUNTER — Encounter: Payer: Self-pay | Admitting: Cardiovascular Disease

## 2017-03-20 VITALS — BP 114/70 | HR 56 | Ht 72.0 in | Wt 169.8 lb

## 2017-03-20 DIAGNOSIS — I2510 Atherosclerotic heart disease of native coronary artery without angina pectoris: Secondary | ICD-10-CM | POA: Diagnosis not present

## 2017-03-20 NOTE — Progress Notes (Signed)
Cardiology Office Note Date:  03/22/2017   ID:  Alex Meyers, DOB [birth year], MRN [id number]  PCP:  Janith Lima, MD  Cardiologist:  Sherren Mocha, MD    Chief Complaint  Patient presents with  . Follow-up    CAD     History of Present Illness: Alex Meyers is a 76 y.o. male who presents for follow-up of CAD.   The patient underwent stenting of the LAD/diagonal bifurcation in 2010 after presenting with exertional angina and an abnormal stress nuclear scan.  He continues to do well with no cardiac problems. Today, he denies symptoms of palpitations, chest pain, shortness of breath, orthopnea, PND, lower extremity edema, dizziness, or syncope.  He hasn't been running as much as in the past because of the hot, humid conditions this summer. However, he still jogs close to 20 miles per week.  Past Medical History:  Diagnosis Date  . Coronary artery disease    s//p DES LAD and Dx1 01/2009  . Diabetes mellitus   . Diverticulosis   . Hx of colonic polyps 08/2010   Dr. Hilarie Fredrickson  . Hyperlipidemia    pt denies  . Hypertension    pt. denies  . Internal hemorrhoids with Grade 2-3 prolapse and bleeding 09/25/2012   Diagnosed by Dr. Linda Hedges via anoscopy   . Multiple thyroid nodules   . Personal history of colonic polyps 08/09/2002   ADENOMATOUS POLYP  . Tubular adenoma of colon     Past Surgical History:  Procedure Laterality Date  . COLONOSCOPY  multiple  . CORONARY STENT PLACEMENT  01/22/2009   right side  . WRIST SURGERY     right side    Current Outpatient Prescriptions  Medication Sig Dispense Refill  . ACZONE 5 % topical gel Apply 1 application topically daily as needed.     . Canagliflozin (INVOKANA) 300 MG TABS Take 150 tablets by mouth daily. Taking 1/2 tablet    . clopidogrel (PLAVIX) 75 MG tablet TAKE 1 TABLET BY MOUTH DAILY 90 tablet 0  . meloxicam (MOBIC) 15 MG tablet TAKE 1 TABLET BY MOUTH EVERY DAY 90 tablet 2  . metFORMIN (GLUCOPHAGE-XR) 500 MG 24 hr  tablet Take 1 tablet by mouth at bedtime.  12  . pioglitazone (ACTOS) 45 MG tablet Take 45 mg by mouth daily.  11  . rosuvastatin (CRESTOR) 20 MG tablet Take 1 tablet (20 mg total) by mouth daily. 30 tablet 12  . saxagliptin HCl (ONGLYZA) 5 MG TABS tablet Take 1 tablet (5 mg total) by mouth daily. 30 tablet 0  . sildenafil (REVATIO) 20 MG tablet Take 80 mg by mouth daily as needed (ED).     No current facility-administered medications for this visit.     Allergies:   Patient has no known allergies.   Social History:  The patient  reports that he has never smoked. He has never used smokeless tobacco. He reports that he drinks about 2.4 oz of alcohol per week . He reports that he does not use drugs.   Family History:  The patient's  family history includes Bladder Cancer (age of onset: 48) in his father; Breast cancer in his daughter; Coronary artery disease in his mother; Heart failure in his mother.    ROS:  Please see the history of present illness.   All other systems are reviewed and negative.    PHYSICAL EXAM: VS:  BP 114/70   Pulse (!) 56   Ht 6' (1.829 m)  Wt 77 kg (169 lb 12.8 oz)   BMI 23.03 kg/m  , BMI Body mass index is 23.03 kg/m. GEN: Well nourished, well developed, in no acute distress  HEENT: normal  Neck: no JVD, no masses. No carotid bruits Cardiac: RRR without murmur or gallop                Respiratory:  clear to auscultation bilaterally, normal work of breathing GI: soft, nontender, nondistended, + BS MS: no deformity or atrophy  Ext: no pretibial edema, pedal pulses 2+= bilaterally Skin: warm and dry, no rash Neuro:  Strength and sensation are intact Psych: euthymic mood, full affect  EKG:  EKG is ordered today. The ekg ordered today shows sinus brady 56 bpm, age-indeterminate inferior infarct  Recent Labs: 05/24/2016: ALT 15; BUN 19; Creatinine, Ser 1.01; Hemoglobin 13.3; Platelets 120.0; Potassium 4.4; Sodium 142; TSH 2.69   Lipid Panel       Component Value Date/Time   CHOL 127 05/24/2016 1055   TRIG 63.0 05/24/2016 1055   HDL 71.00 05/24/2016 1055   CHOLHDL 2 05/24/2016 1055   VLDL 12.6 05/24/2016 1055   LDLCALC 44 05/24/2016 1055      Wt Readings from Last 3 Encounters:  03/20/17 77 kg (169 lb 12.8 oz)  02/07/17 75.3 kg (166 lb)  06/14/16 74.6 kg (164 lb 8 oz)     Cardiac Studies Reviewed: Exercise echocardiogram 01/21/2016: Exercise stress echo: Study Conclusions  - Stress ECG conclusions: The stress ECG was negative for ischemia.  [facility] scoring: exercise time of 14 min; maximum ST deviation of 0  mm; no angina; resulting score is 14. This score predicts a low  risk of cardiac events. - Staged echo: There was no diagnostic evidence for stress-induced  ischemia.  ASSESSMENT AND PLAN: 1.  CAD, native vessel: no angina. Reviewed cath note from remote PCI. Recommend stop ASA and continue on clopidogrel 75 mg daily. Otherwise continue current medical program. Exercise stress echo from 2017 reviewed as outlined above.  2. Hyperlipidemia: lipids followed by PCP. Treated with a high-intensity statin drug using crestor 20 mg. Excellent lifestyle.  3. Type II DM: managed with oral hypoglycemic agents.  Current medicines are reviewed with the patient today.  The patient does not have concerns regarding medicines.  Labs/ tests ordered today include:   Orders Placed This Encounter  Procedures  . EKG 12-Lead    Disposition:   FU one year with an exercise treadmill study  Signed, Sherren Mocha, MD  03/22/2017 5:46 AM    [facility] [address], [place], [place]  [address] Phone: [phone]; Fax: [phone]

## 2017-03-20 NOTE — Patient Instructions (Signed)
Medication Instructions:  1) STOP ASPIRIN  Labwork: None  Testing/Procedures: Your provider has requested that you have an exercise tolerance test in one year. For further information please visit [email or web address]. Please also follow instruction sheet, as given.   Follow-Up: Your provider wants you to follow-up in: 1 year with Dr. Burt Knack. You will receive a reminder letter in the mail two months in advance. If you don't receive a letter, please call our office to schedule the follow-up appointment.    Any Other Special Instructions Will Be Listed Below (If Applicable).     If you need a refill on your cardiac medications before your next appointment, please call your pharmacy.

## 2017-03-24 ENCOUNTER — Other Ambulatory Visit: Payer: Self-pay | Admitting: Cardiovascular Disease

## 2017-04-06 DIAGNOSIS — E1159 Type 2 diabetes mellitus with other circulatory complications: Secondary | ICD-10-CM | POA: Diagnosis not present

## 2017-04-06 DIAGNOSIS — Z79899 Other long term (current) drug therapy: Secondary | ICD-10-CM | POA: Diagnosis not present

## 2017-04-06 DIAGNOSIS — E7800 Pure hypercholesterolemia, unspecified: Secondary | ICD-10-CM | POA: Diagnosis not present

## 2017-04-06 LAB — HEMOGLOBIN A1C: HEMOGLOBIN A1C: 6.5

## 2017-04-06 LAB — BASIC METABOLIC PANEL
BUN: 29 — AB (ref 4–21)
BUN: 25 — AB (ref 4–21)
CREATININE: 1.1 (ref 0.6–1.3)
Glucose: 122
Glucose: 122
POTASSIUM: 3.9 (ref 3.4–5.3)
POTASSIUM: 3.9 (ref 3.4–5.3)
SODIUM: 141 (ref 137–147)
SODIUM: 141 (ref 137–147)

## 2017-04-06 LAB — LIPID PANEL
Cholesterol: 128 (ref 0–200)
HDL: 69 (ref 35–70)
HDL: 69 (ref 35–70)
LDL CALC: 40
LDL Cholesterol: 40
TRIGLYCERIDES: 89 (ref 40–160)

## 2017-04-06 LAB — HEPATIC FUNCTION PANEL
ALT: 12 (ref 10–40)
AST: 18 (ref 14–40)
Alkaline Phosphatase: 43 (ref 25–125)
BILIRUBIN, TOTAL: 0.7
BILIRUBIN, TOTAL: 0.7

## 2017-04-06 LAB — MICROALBUMIN, URINE: MICROALB UR: 6

## 2017-04-06 LAB — VITAMIN B12: VITAMIN B 12: 130

## 2017-04-06 LAB — CBC AND DIFFERENTIAL: HEMOGLOBIN: 6.5 — AB (ref 13.5–17.5)

## 2017-04-13 DIAGNOSIS — I2510 Atherosclerotic heart disease of native coronary artery without angina pectoris: Secondary | ICD-10-CM | POA: Diagnosis not present

## 2017-04-13 DIAGNOSIS — E1159 Type 2 diabetes mellitus with other circulatory complications: Secondary | ICD-10-CM | POA: Diagnosis not present

## 2017-04-13 DIAGNOSIS — Z23 Encounter for immunization: Secondary | ICD-10-CM | POA: Diagnosis not present

## 2017-04-13 DIAGNOSIS — E7800 Pure hypercholesterolemia, unspecified: Secondary | ICD-10-CM | POA: Diagnosis not present

## 2017-05-11 ENCOUNTER — Encounter: Payer: Self-pay | Admitting: Internal Medicine

## 2017-05-11 DIAGNOSIS — E538 Deficiency of other specified B group vitamins: Secondary | ICD-10-CM | POA: Insufficient documentation

## 2017-05-18 ENCOUNTER — Ambulatory Visit: Payer: Medicare Other | Admitting: Internal Medicine

## 2017-05-18 ENCOUNTER — Encounter: Payer: Self-pay | Admitting: Internal Medicine

## 2017-05-18 VITALS — BP 110/70 | HR 59 | Temp 97.8°F | Ht 72.0 in | Wt 172.8 lb

## 2017-05-18 DIAGNOSIS — Z23 Encounter for immunization: Secondary | ICD-10-CM | POA: Diagnosis not present

## 2017-05-18 DIAGNOSIS — L84 Corns and callosities: Secondary | ICD-10-CM

## 2017-05-18 DIAGNOSIS — E118 Type 2 diabetes mellitus with unspecified complications: Secondary | ICD-10-CM

## 2017-05-18 DIAGNOSIS — E538 Deficiency of other specified B group vitamins: Secondary | ICD-10-CM

## 2017-05-18 DIAGNOSIS — I10 Essential (primary) hypertension: Secondary | ICD-10-CM

## 2017-05-18 DIAGNOSIS — E11628 Type 2 diabetes mellitus with other skin complications: Secondary | ICD-10-CM

## 2017-05-18 MED ORDER — CYANOCOBALAMIN 1000 MCG/ML IJ SOLN
1000.0000 ug | Freq: Once | INTRAMUSCULAR | Status: AC
  Administered 2017-05-18: 1000 ug via INTRAMUSCULAR

## 2017-05-18 NOTE — Progress Notes (Signed)
Subjective:  Patient ID: Alex Meyers, male    DOB: 27-May-1941  Age: 76 y.o. MRN: [id number]  CC: Diabetes   HPI Alex Meyers presents for f/up -he was recently seen by his endocrinologist and was found to have a vitamin B12 level of 130.  He complains of declining memory and forgetfulness over the last 1- 2 years.  Outpatient Medications Prior to Visit  Medication Sig Dispense Refill  . ACZONE 5 % topical gel Apply 1 application topically daily as needed.     . Canagliflozin (INVOKANA) 300 MG TABS Take 150 tablets by mouth daily. Taking 1/2 tablet    . clopidogrel (PLAVIX) 75 MG tablet TAKE 1 TABLET BY MOUTH DAILY 30 tablet 11  . meloxicam (MOBIC) 15 MG tablet TAKE 1 TABLET BY MOUTH EVERY DAY 90 tablet 2  . metFORMIN (GLUCOPHAGE-XR) 500 MG 24 hr tablet Take 1 tablet by mouth at bedtime.  12  . pioglitazone (ACTOS) 45 MG tablet Take 45 mg by mouth daily.  11  . rosuvastatin (CRESTOR) 20 MG tablet Take 1 tablet (20 mg total) by mouth daily. 30 tablet 12  . saxagliptin HCl (ONGLYZA) 5 MG TABS tablet Take 1 tablet (5 mg total) by mouth daily. 30 tablet 0  . sildenafil (REVATIO) 20 MG tablet Take 80 mg by mouth daily as needed (ED).     No facility-administered medications prior to visit.     ROS Review of Systems  Constitutional: Negative.  Negative for appetite change, diaphoresis and fatigue.  HENT: Negative.   Eyes: Negative.   Respiratory: Negative.  Negative for cough, chest tightness, shortness of breath and wheezing.   Cardiovascular: Negative for chest pain, palpitations and leg swelling.  Gastrointestinal: Negative for abdominal pain, constipation, diarrhea and nausea.  Genitourinary: Negative.  Negative for difficulty urinating.  Musculoskeletal: Negative.  Negative for back pain.  Skin: Negative.  Negative for color change.  Allergic/Immunologic: Negative.   Neurological: Negative.  Negative for dizziness, seizures, weakness, light-headedness and numbness.    Hematological: Negative for adenopathy. Does not bruise/bleed easily.  Psychiatric/Behavioral: Negative.  Negative for confusion, decreased concentration, dysphoric mood and suicidal ideas. The patient is not nervous/anxious.     Objective:  BP 110/70 (BP Location: Left Arm, Patient Position: Sitting, Cuff Size: Normal)   Pulse (!) 59   Temp 97.8 F (36.6 C) (Oral)   Ht 6' (1.829 m)   Wt 172 lb 12 oz (78.4 kg)   SpO2 96%   BMI 23.43 kg/m   BP Readings from Last 3 Encounters:  05/18/17 110/70  03/20/17 114/70  02/07/17 108/68    Wt Readings from Last 3 Encounters:  05/18/17 172 lb 12 oz (78.4 kg)  03/20/17 169 lb 12.8 oz (77 kg)  02/07/17 166 lb (75.3 kg)    Physical Exam  Constitutional: He is oriented to person, place, and time. No distress.  HENT:  Mouth/Throat: Oropharynx is clear and moist. No oropharyngeal exudate.  Eyes: Conjunctivae are normal. Right eye exhibits no discharge. Left eye exhibits no discharge. No scleral icterus.  Neck: Normal range of motion. Neck supple. No JVD present. No thyromegaly present.  Cardiovascular: Normal rate, regular rhythm and intact distal pulses. Exam reveals no gallop and no friction rub.  No murmur heard. Pulmonary/Chest: Effort normal and breath sounds normal. No respiratory distress. He has no wheezes. He has no rales. He exhibits no tenderness.  Abdominal: Soft. Bowel sounds are normal. He exhibits no distension and no mass. There is no tenderness.  There is no rebound and no guarding.  Musculoskeletal: Normal range of motion. He exhibits no edema, tenderness or deformity.  Lymphadenopathy:    He has no cervical adenopathy.  Neurological: He is alert and oriented to person, place, and time. He has normal reflexes. He displays normal reflexes. No cranial nerve deficit. He exhibits normal muscle tone. Coordination normal.  Skin: Skin is warm and dry. No rash noted. He is not diaphoretic. No erythema. No pallor.  Vitals  reviewed.   Lab Results  Component Value Date   WBC 4.4 05/24/2016   HGB 6.5 (A) 04/06/2017   HCT 39.4 05/24/2016   PLT 120.0 (L) 05/24/2016   GLUCOSE 131 (H) 05/24/2016   CHOL 128 04/06/2017   TRIG 89 04/06/2017   HDL 69 04/06/2017   HDL 69 04/06/2017   LDLCALC 40 04/06/2017   LDLCALC 40 04/06/2017   ALT 12 04/06/2017   AST 18 04/06/2017   NA 141 04/06/2017   NA 141 04/06/2017   K 3.9 04/06/2017   K 3.9 04/06/2017   CL 107 05/24/2016   CREATININE 1.1 04/06/2017   BUN 25 (A) 04/06/2017   BUN 29 (A) 04/06/2017   CO2 27 05/24/2016   TSH 2.69 05/24/2016   PSA 0.35 05/24/2016   INR 1.2 ratio (H) 01/21/2009   HGBA1C 6.5 04/06/2017   MICROALBUR 6 04/06/2017    No results found.  Assessment & Plan:   Sender was seen today for diabetes.  Diagnoses and all orders for this visit:  Need for influenza vaccination -     Flu vaccine HIGH DOSE PF (Fluzone High dose)  Type 2 diabetes mellitus with pressure callus ([place])- His recent A1c was 6.5%.  His blood sugars are adequately well controlled.  Essential hypertension- His blood pressure is adequately well controlled.  Electrolytes and renal function are normal.  B12 deficiency - Will start parenteral B12 replacement therapy.  He will receive 1000 mcg monthly. -     cyanocobalamin ((VITAMIN B-12)) injection 1,000 mcg  Diabetes mellitus with complication ([place])- as above   I am having Alex Meyers maintain his rosuvastatin, saxagliptin HCl, ACZONE, canagliflozin, pioglitazone, metFORMIN, meloxicam, sildenafil, and clopidogrel. We administered cyanocobalamin.  Meds ordered this encounter  Medications  . cyanocobalamin ((VITAMIN B-12)) injection 1,000 mcg     Follow-up: Return in about 4 weeks (around 06/15/2017).  Scarlette Calico, MD

## 2017-05-18 NOTE — Patient Instructions (Signed)
Vitamin B12 Deficiency Vitamin B12 deficiency means that your body is not getting enough vitamin B12. Your body needs vitamin B12 for important bodily functions. If you do not have enough vitamin B12 in your body, you can have health problems. Follow these instructions at home:  Take supplements only as told by your doctor. Follow the directions carefully.  Get any shots (injections) as told by your doctor. Do not miss your visits to the doctor.  Eat lots of healthy foods that contain vitamin B12. Ask your doctor if you should work with someone who is trained in how food affects health (dietitian). Foods that contain vitamin B12 include: ? Meat. ? Meat from birds (poultry). ? Fish. ? Eggs. ? Cereal and dairy products that are fortified. This means that vitamin B12 has been added to the food. Check the label on the package to see if the food is fortified.  Do not drink too much (do not abuse) alcohol.  Keep all follow-up visits as told by your doctor. This is important. Contact a doctor if:  Your symptoms come back. Get help right away if:  You have trouble breathing.  You have chest pain.  You get dizzy.  You pass out (lose consciousness). This information is not intended to replace advice given to you by your health care provider. Make sure you discuss any questions you have with your health care provider. Document Released: 06/09/2011 Document Revised: 11/26/2015 Document Reviewed: 11/05/2014 Elsevier Interactive Patient Education  2018 [employer]  

## 2017-05-23 NOTE — Progress Notes (Addendum)
Subjective:   Alex Meyers is a 76 y.o. male who presents for Medicare Annual/Subsequent preventive examination.  Review of Systems:  No ROS.  Medicare Wellness Visit. Additional risk factors are reflected in the social history.  Cardiac Risk Factors include: advanced age (>77men, >29 women);diabetes mellitus;dyslipidemia;hypertension;male gender Sleep patterns: feels rested on waking, gets up 1 times nightly to void and sleeps 6-7 hours nightly.    Home Safety/Smoke Alarms: Feels safe in home. Smoke alarms in place.  Living environment; residence and Firearm Safety: 2-story house, no firearms. Lives alone, no needs for DME, good support system Seat Belt Safety/Bike Helmet: Wears seat belt.     Objective:    Vitals: BP 122/72   Pulse 62   Resp 20   Ht 6' (1.829 m)   Wt 171 lb (77.6 kg)   SpO2 99%   BMI 23.19 kg/m   Body mass index is 23.19 kg/m.  Tobacco Social History   Tobacco Use  Smoking Status Never Smoker  Smokeless Tobacco Never Used     Counseling given: Not Answered   Past Medical History:  Diagnosis Date  . Coronary artery disease    s//p DES LAD and Dx1 01/2009  . Diabetes mellitus   . Diverticulosis   . Hx of colonic polyps 08/2010   Dr. Hilarie Fredrickson  . Hyperlipidemia    pt denies  . Hypertension    pt. denies  . Internal hemorrhoids with Grade 2-3 prolapse and bleeding 09/25/2012   Diagnosed by Dr. Linda Hedges via anoscopy   . Multiple thyroid nodules   . Personal history of colonic polyps 08/09/2002   ADENOMATOUS POLYP  . Tubular adenoma of colon    Past Surgical History:  Procedure Laterality Date  . COLONOSCOPY  multiple  . CORONARY STENT PLACEMENT  01/22/2009   right side  . WRIST SURGERY     right side   Family History  Problem Relation Age of Onset  . Heart failure Mother   . Coronary artery disease Mother   . Bladder Cancer Father 66  . Breast cancer Daughter   . Colon cancer Neg Hx    Social History   Substance and Sexual Activity    Sexual Activity No    Outpatient Encounter Medications as of 05/24/2017  Medication Sig  . ACZONE 5 % topical gel Apply 1 application topically daily as needed.   . Canagliflozin (INVOKANA) 300 MG TABS Take 150 tablets by mouth daily. Taking 1/2 tablet  . clopidogrel (PLAVIX) 75 MG tablet TAKE 1 TABLET BY MOUTH DAILY  . cycloSPORINE (RESTASIS) 0.05 % ophthalmic emulsion Place 1 drop into both eyes 2 (two) times daily.  . meloxicam (MOBIC) 15 MG tablet TAKE 1 TABLET BY MOUTH EVERY DAY  . metFORMIN (GLUCOPHAGE-XR) 500 MG 24 hr tablet Take 1 tablet by mouth at bedtime.  . pioglitazone (ACTOS) 45 MG tablet Take 45 mg by mouth daily.  . rosuvastatin (CRESTOR) 20 MG tablet Take 1 tablet (20 mg total) by mouth daily.  . saxagliptin HCl (ONGLYZA) 5 MG TABS tablet Take 1 tablet (5 mg total) by mouth daily.  . sildenafil (REVATIO) 20 MG tablet Take 80 mg by mouth daily as needed (ED).   No facility-administered encounter medications on file as of 05/24/2017.     Activities of Daily Living In your present state of health, do you have any difficulty performing the following activities: 05/24/2017  Hearing? N  Vision? N  Difficulty concentrating or making decisions? N  Walking or climbing  stairs? N  Dressing or bathing? N  Doing errands, shopping? N  Preparing Food and eating ? N  Using the Toilet? N  In the past six months, have you accidently leaked urine? N  Do you have problems with loss of bowel control? N  Managing your Medications? N  Managing your Finances? N  Housekeeping or managing your Housekeeping? N  Some recent data might be hidden    Patient Care Team: Janith Lima, MD as PCP - General (Internal Medicine) Pyrtle, Lajuan Lines, MD (Gastroenterology) Altheimer, Legrand Como, MD as Consulting Physician (Endocrinology) Clent Jacks, MD as Consulting Physician (Ophthalmology) Stefanie Libel, MD as Consulting Physician (Sports Medicine) Sherren Mocha, MD as Consulting Physician  (Cardiology) Sydnee Levans, MD as Consulting Physician (Dermatology) Domingo Pulse, MD as Consulting Physician (Urology) Geoffry Paradise as Consulting Physician (Oncology)   Assessment:    Physical assessment deferred to PCP.  Exercise Activities and Dietary recommendations Current Exercise Habits: Home exercise routine, Type of exercise: strength training/weights;stretching;calisthenics, Time (Minutes): 60(jogs x3 weekly, runs about 6 miles each time), Frequency (Times/Week): 3, Weekly Exercise (Minutes/Week): 180, Intensity: Intense, Exercise limited by: None identified  Diet (meal preparation, eat out, water intake, caffeinated beverages, dairy products, fruits and vegetables): in general, a "healthy" diet  , well balanced, eats a variety of fruits and vegetables daily, limits salt, fat/cholesterol, sugar, caffeine, drinks 6-8 glasses of water daily.  Goals    . Patient Stated     I would like to play more tennis so that I can be more competitive when playing with my peers.      Fall Risk Fall Risk  05/24/2017 02/07/2017 05/29/2016 12/31/2014 09/01/2014  Falls in the past year? Yes No No No No  Comment patient has fallen while jogging - - - -  Number falls in past yr: 2 or more - - - -  Injury with Fall? No - - - -  Risk for fall due to : - - - Other (Comment) -   Depression Screen PHQ 2/9 Scores 05/24/2017 05/29/2016 12/31/2014 09/01/2014  PHQ - 2 Score 2 0 0 0  PHQ- 9 Score 3 - - -  Exception Documentation - - Other- indicate reason in comment box -    Cognitive Function MMSE - Mini Mental State Exam 05/24/2017  Orientation to time 5  Orientation to Place 5  Registration 3  Attention/ Calculation 5  Recall 1  Language- name 2 objects 2  Language- repeat 1  Language- follow 3 step command 3  Language- read & follow direction 1  Write a sentence 1  Copy design 1  Total score 28        Immunization History  Administered Date(s) Administered  . Influenza  Split 04/24/2014  . Influenza, High Dose Seasonal PF 03/05/2015, 05/23/2016, 05/18/2017  . Influenza-Unspecified 06/03/2012  . Pneumococcal Conjugate-13 09/01/2014  . Pneumococcal Polysaccharide-23 01/01/2013  . Tdap 01/01/2013   Screening Tests Health Maintenance  Topic Date Due  . FOOT EXAM  05/23/2017  . OPHTHALMOLOGY EXAM  07/01/2017  . HEMOGLOBIN A1C  10/05/2017  . URINE MICROALBUMIN  04/06/2018  . COLONOSCOPY  01/14/2020  . TETANUS/TDAP  01/02/2023  . INFLUENZA VACCINE  Completed  . PNA vac Low Risk Adult  Completed      Plan:    Continue doing brain stimulating activities (puzzles, reading, adult coloring books, staying active) to keep memory sharp.   Continue to eat heart healthy diet (full of fruits, vegetables, whole grains, lean protein,  water--limit salt, fat, and sugar intake) and increase physical activity as tolerated.  I have personally reviewed and noted the following in the patient's chart:   . Medical and social history . Use of alcohol, tobacco or illicit drugs  . Current medications and supplements . Functional ability and status . Nutritional status . Physical activity . Advanced directives . List of other physicians . Vitals . Screenings to include cognitive, depression, and falls . Referrals and appointments  In addition, I have reviewed and discussed with patient certain preventive protocols, quality metrics, and best practice recommendations. A written personalized care plan for preventive services as well as general preventive health recommendations were provided to patient.     Michiel Cowboy, RN  05/24/2017 Medical screening examination/treatment/procedure(s) were performed by non-physician practitioner and as supervising physician I was immediately available for consultation/collaboration. I agree with above. Lew Dawes, MD

## 2017-05-24 ENCOUNTER — Ambulatory Visit (INDEPENDENT_AMBULATORY_CARE_PROVIDER_SITE_OTHER): Payer: Medicare Other | Admitting: *Deleted

## 2017-05-24 VITALS — BP 122/72 | HR 62 | Resp 20 | Ht 72.0 in | Wt 171.0 lb

## 2017-05-24 DIAGNOSIS — Z0000 Encounter for general adult medical examination without abnormal findings: Secondary | ICD-10-CM

## 2017-05-24 NOTE — Patient Instructions (Signed)
Continue doing brain stimulating activities (puzzles, reading, adult coloring books, staying active) to keep memory sharp.   Continue to eat heart healthy diet (full of fruits, vegetables, whole grains, lean protein, water--limit salt, fat, and sugar intake) and increase physical activity as tolerated.   Alex Meyers , Thank you for taking time to come for your Medicare Wellness Visit. I appreciate your ongoing commitment to your health goals. Please review the following plan we discussed and let me know if I can assist you in the future.   These are the goals we discussed: Goals    . Patient Stated     I would like to play more tennis so that I can be more competitive when playing with my peers.       This is a list of the screening recommended for you and due dates:  Health Maintenance  Topic Date Due  . Complete foot exam   05/23/2017  . Eye exam for diabetics  07/01/2017  . Hemoglobin A1C  10/05/2017  . Urine Protein Check  04/06/2018  . Colon Cancer Screening  01/14/2020  . Tetanus Vaccine  01/02/2023  . Flu Shot  Completed  . Pneumonia vaccines  Completed

## 2017-06-05 DIAGNOSIS — H903 Sensorineural hearing loss, bilateral: Secondary | ICD-10-CM | POA: Diagnosis not present

## 2017-06-05 DIAGNOSIS — H9311 Tinnitus, right ear: Secondary | ICD-10-CM | POA: Diagnosis not present

## 2017-06-05 DIAGNOSIS — H6121 Impacted cerumen, right ear: Secondary | ICD-10-CM | POA: Diagnosis not present

## 2017-06-09 DIAGNOSIS — H01024 Squamous blepharitis left upper eyelid: Secondary | ICD-10-CM | POA: Diagnosis not present

## 2017-06-09 DIAGNOSIS — H01021 Squamous blepharitis right upper eyelid: Secondary | ICD-10-CM | POA: Diagnosis not present

## 2017-06-09 DIAGNOSIS — H01022 Squamous blepharitis right lower eyelid: Secondary | ICD-10-CM | POA: Diagnosis not present

## 2017-06-09 DIAGNOSIS — H01025 Squamous blepharitis left lower eyelid: Secondary | ICD-10-CM | POA: Diagnosis not present

## 2017-06-09 LAB — HM DIABETES EYE EXAM

## 2017-06-15 ENCOUNTER — Ambulatory Visit: Payer: Medicare Other | Admitting: Internal Medicine

## 2017-06-15 ENCOUNTER — Encounter: Payer: Self-pay | Admitting: Internal Medicine

## 2017-06-15 VITALS — BP 110/70 | HR 57 | Temp 98.5°F | Resp 16 | Ht 72.0 in | Wt 172.8 lb

## 2017-06-15 DIAGNOSIS — E538 Deficiency of other specified B group vitamins: Secondary | ICD-10-CM

## 2017-06-15 DIAGNOSIS — I10 Essential (primary) hypertension: Secondary | ICD-10-CM | POA: Diagnosis not present

## 2017-06-15 MED ORDER — CYANOCOBALAMIN 1000 MCG/ML IJ SOLN
1000.0000 ug | Freq: Once | INTRAMUSCULAR | Status: AC
  Administered 2017-06-15: 1000 ug via INTRAMUSCULAR

## 2017-06-15 NOTE — Progress Notes (Signed)
Subjective:  Patient ID: Alex Meyers, male    DOB: 07-26-1940  Age: 76 y.o. MRN: [id number]  CC: Hypertension and Diabetes   HPI TIRRELL BUCHBERGER presents for f/up - He is due for his second B12 injection.  He tells me his blood pressure has been well controlled.  He feels well today and offers no complaints.  Outpatient Medications Prior to Visit  Medication Sig Dispense Refill  . ACZONE 5 % topical gel Apply 1 application topically daily as needed.     . Canagliflozin (INVOKANA) 300 MG TABS Take 150 tablets by mouth daily. Taking 1/2 tablet    . clopidogrel (PLAVIX) 75 MG tablet TAKE 1 TABLET BY MOUTH DAILY 30 tablet 11  . meloxicam (MOBIC) 15 MG tablet TAKE 1 TABLET BY MOUTH EVERY DAY 90 tablet 2  . metFORMIN (GLUCOPHAGE-XR) 500 MG 24 hr tablet Take 1 tablet by mouth at bedtime.  12  . pioglitazone (ACTOS) 45 MG tablet Take 45 mg by mouth daily.  11  . rosuvastatin (CRESTOR) 20 MG tablet Take 1 tablet (20 mg total) by mouth daily. 30 tablet 12  . saxagliptin HCl (ONGLYZA) 5 MG TABS tablet Take 1 tablet (5 mg total) by mouth daily. 30 tablet 0  . sildenafil (REVATIO) 20 MG tablet Take 80 mg by mouth daily as needed (ED).    . cycloSPORINE (RESTASIS) 0.05 % ophthalmic emulsion Place 1 drop into both eyes 2 (two) times daily.     No facility-administered medications prior to visit.     ROS Review of Systems  All other systems reviewed and are negative.   Objective:  BP 110/70 (BP Location: Left Arm, Patient Position: Sitting, Cuff Size: Normal)   Pulse (!) 57   Temp 98.5 F (36.9 C) (Oral)   Resp 16   Ht 6' (1.829 m)   Wt 172 lb 12 oz (78.4 kg)   SpO2 96%   BMI 23.43 kg/m   BP Readings from Last 3 Encounters:  06/15/17 110/70  05/24/17 122/72  05/18/17 110/70    Wt Readings from Last 3 Encounters:  06/15/17 172 lb 12 oz (78.4 kg)  05/24/17 171 lb (77.6 kg)  05/18/17 172 lb 12 oz (78.4 kg)    Physical Exam  Constitutional: He is oriented to person, place,  and time. No distress.  HENT:  Mouth/Throat: Oropharynx is clear and moist. No oropharyngeal exudate.  Eyes: Conjunctivae are normal. Left eye exhibits no discharge. No scleral icterus.  Neck: Normal range of motion. Neck supple. No JVD present. No thyromegaly present.  Cardiovascular: Normal rate, regular rhythm and normal heart sounds.  No murmur heard. Pulmonary/Chest: Effort normal and breath sounds normal. No respiratory distress. He has no wheezes. He has no rales.  Abdominal: Soft. Bowel sounds are normal. He exhibits no mass. There is no tenderness.  Musculoskeletal: Normal range of motion. He exhibits no edema, tenderness or deformity.  Lymphadenopathy:    He has no cervical adenopathy.  Neurological: He is alert and oriented to person, place, and time.  Skin: Skin is warm and dry. No rash noted. He is not diaphoretic. No erythema. No pallor.  Vitals reviewed.   Lab Results  Component Value Date   WBC 4.4 05/24/2016   HGB 6.5 (A) 04/06/2017   HCT 39.4 05/24/2016   PLT 120.0 (L) 05/24/2016   GLUCOSE 131 (H) 05/24/2016   CHOL 128 04/06/2017   TRIG 89 04/06/2017   HDL 69 04/06/2017   HDL 69 04/06/2017  LDLCALC 40 04/06/2017   LDLCALC 40 04/06/2017   ALT 12 04/06/2017   AST 18 04/06/2017   NA 141 04/06/2017   NA 141 04/06/2017   K 3.9 04/06/2017   K 3.9 04/06/2017   CL 107 05/24/2016   CREATININE 1.1 04/06/2017   BUN 25 (A) 04/06/2017   BUN 29 (A) 04/06/2017   CO2 27 05/24/2016   TSH 2.69 05/24/2016   PSA 0.35 05/24/2016   INR 1.2 ratio (H) 01/21/2009   HGBA1C 6.5 04/06/2017   MICROALBUR 6 04/06/2017    No results found.  Assessment & Plan:   Virlan was seen today for hypertension and diabetes.  Diagnoses and all orders for this visit:  Essential hypertension- His blood pressure is well controlled.  B12 deficiency- This is most likely related to the chronic metformin therapy.  I have asked him to continue to receive a B12 injection every 1-2 months. -      cyanocobalamin ((VITAMIN B-12)) injection 1,000 mcg   I have discontinued [place]. Klemz's cycloSPORINE. I am also having him maintain his rosuvastatin, saxagliptin HCl, ACZONE, canagliflozin, pioglitazone, metFORMIN, meloxicam, sildenafil, and clopidogrel. We administered cyanocobalamin.  Meds ordered this encounter  Medications  . cyanocobalamin ((VITAMIN B-12)) injection 1,000 mcg     Follow-up: Return in about 2 months (around 08/16/2017).  Scarlette Calico, MD

## 2017-06-15 NOTE — Patient Instructions (Signed)
Vitamin B12 Deficiency Vitamin B12 deficiency means that your body is not getting enough vitamin B12. Your body needs vitamin B12 for important bodily functions. If you do not have enough vitamin B12 in your body, you can have health problems. Follow these instructions at home:  Take supplements only as told by your doctor. Follow the directions carefully.  Get any shots (injections) as told by your doctor. Do not miss your visits to the doctor.  Eat lots of healthy foods that contain vitamin B12. Ask your doctor if you should work with someone who is trained in how food affects health (dietitian). Foods that contain vitamin B12 include: ? Meat. ? Meat from birds (poultry). ? Fish. ? Eggs. ? Cereal and dairy products that are fortified. This means that vitamin B12 has been added to the food. Check the label on the package to see if the food is fortified.  Do not drink too much (do not abuse) alcohol.  Keep all follow-up visits as told by your doctor. This is important. Contact a doctor if:  Your symptoms come back. Get help right away if:  You have trouble breathing.  You have chest pain.  You get dizzy.  You pass out (lose consciousness). This information is not intended to replace advice given to you by your health care provider. Make sure you discuss any questions you have with your health care provider. Document Released: 06/09/2011 Document Revised: 11/26/2015 Document Reviewed: 11/05/2014 Elsevier Interactive Patient Education  2018 [employer]  

## 2017-06-20 DIAGNOSIS — E7800 Pure hypercholesterolemia, unspecified: Secondary | ICD-10-CM | POA: Diagnosis not present

## 2017-06-20 DIAGNOSIS — E538 Deficiency of other specified B group vitamins: Secondary | ICD-10-CM | POA: Diagnosis not present

## 2017-06-20 DIAGNOSIS — E1159 Type 2 diabetes mellitus with other circulatory complications: Secondary | ICD-10-CM | POA: Diagnosis not present

## 2017-06-20 DIAGNOSIS — E042 Nontoxic multinodular goiter: Secondary | ICD-10-CM | POA: Diagnosis not present

## 2017-06-22 DIAGNOSIS — E7800 Pure hypercholesterolemia, unspecified: Secondary | ICD-10-CM | POA: Diagnosis not present

## 2017-06-22 DIAGNOSIS — I2510 Atherosclerotic heart disease of native coronary artery without angina pectoris: Secondary | ICD-10-CM | POA: Diagnosis not present

## 2017-06-22 DIAGNOSIS — E1159 Type 2 diabetes mellitus with other circulatory complications: Secondary | ICD-10-CM | POA: Diagnosis not present

## 2017-06-22 DIAGNOSIS — E042 Nontoxic multinodular goiter: Secondary | ICD-10-CM | POA: Diagnosis not present

## 2017-06-22 LAB — HEPATIC FUNCTION PANEL
ALT: 16 (ref 10–40)
AST: 19 (ref 14–40)
Alkaline Phosphatase: 45 (ref 25–125)
Bilirubin, Total: 0.8

## 2017-06-22 LAB — BASIC METABOLIC PANEL
BUN: 19 (ref 4–21)
CREATININE: 1 (ref 0.6–1.3)
Glucose: 165
POTASSIUM: 4 (ref 3.4–5.3)
SODIUM: 139 (ref 137–147)

## 2017-06-22 LAB — VITAMIN B12: VITAMIN B 12: 275

## 2017-06-22 LAB — HEMOGLOBIN A1C: HEMOGLOBIN A1C: 6.6

## 2017-07-11 ENCOUNTER — Encounter: Payer: Self-pay | Admitting: Internal Medicine

## 2017-07-11 LAB — CHLORIDE
ALBUMIN: 3.8 (ref 3.5–5.0)
Anion gap: 8
CALCIUM/CREAT. RATIO: 9.4
CHLORIDE: 106
CHOLESTEROL, TOTAL: 132
CO2: 25
EGFR (Non-African Amer.): 78
HDL: 66
LDL Cholesterol: 48
Total Protein: 5.7
Triglycerides: 90 (ref 40–160)

## 2017-07-11 NOTE — Progress Notes (Signed)
Outside notes received. Information abstracted. Notes sent to scan.  

## 2017-07-18 ENCOUNTER — Ambulatory Visit: Payer: Medicare Other

## 2017-07-25 ENCOUNTER — Ambulatory Visit: Payer: Medicare Other

## 2017-08-07 ENCOUNTER — Other Ambulatory Visit: Payer: Self-pay | Admitting: Podiatry

## 2017-08-08 DIAGNOSIS — L570 Actinic keratosis: Secondary | ICD-10-CM | POA: Diagnosis not present

## 2017-08-08 DIAGNOSIS — Z85828 Personal history of other malignant neoplasm of skin: Secondary | ICD-10-CM | POA: Diagnosis not present

## 2017-08-08 DIAGNOSIS — L853 Xerosis cutis: Secondary | ICD-10-CM | POA: Diagnosis not present

## 2017-08-08 DIAGNOSIS — L814 Other melanin hyperpigmentation: Secondary | ICD-10-CM | POA: Diagnosis not present

## 2017-08-21 DIAGNOSIS — E538 Deficiency of other specified B group vitamins: Secondary | ICD-10-CM | POA: Diagnosis not present

## 2017-08-21 DIAGNOSIS — E1159 Type 2 diabetes mellitus with other circulatory complications: Secondary | ICD-10-CM | POA: Diagnosis not present

## 2017-08-21 DIAGNOSIS — E7800 Pure hypercholesterolemia, unspecified: Secondary | ICD-10-CM | POA: Diagnosis not present

## 2017-08-24 DIAGNOSIS — E042 Nontoxic multinodular goiter: Secondary | ICD-10-CM | POA: Diagnosis not present

## 2017-08-25 DIAGNOSIS — E1159 Type 2 diabetes mellitus with other circulatory complications: Secondary | ICD-10-CM | POA: Diagnosis not present

## 2017-08-25 DIAGNOSIS — E7800 Pure hypercholesterolemia, unspecified: Secondary | ICD-10-CM | POA: Diagnosis not present

## 2017-08-25 DIAGNOSIS — E042 Nontoxic multinodular goiter: Secondary | ICD-10-CM | POA: Diagnosis not present

## 2017-08-25 DIAGNOSIS — I2510 Atherosclerotic heart disease of native coronary artery without angina pectoris: Secondary | ICD-10-CM | POA: Diagnosis not present

## 2017-10-12 ENCOUNTER — Other Ambulatory Visit (INDEPENDENT_AMBULATORY_CARE_PROVIDER_SITE_OTHER): Payer: Medicare Other

## 2017-10-12 ENCOUNTER — Ambulatory Visit: Payer: Medicare Other | Admitting: Family Medicine

## 2017-10-12 ENCOUNTER — Encounter: Payer: Self-pay | Admitting: Family Medicine

## 2017-10-12 VITALS — BP 118/64 | HR 58 | Temp 97.9°F | Ht 72.0 in | Wt 166.0 lb

## 2017-10-12 DIAGNOSIS — E538 Deficiency of other specified B group vitamins: Secondary | ICD-10-CM

## 2017-10-12 DIAGNOSIS — J3489 Other specified disorders of nose and nasal sinuses: Secondary | ICD-10-CM | POA: Diagnosis not present

## 2017-10-12 LAB — VITAMIN B12: VITAMIN B 12: 221 pg/mL (ref 211–911)

## 2017-10-12 NOTE — Progress Notes (Signed)
Alex Meyers - 77 y.o. male MRN [id number]  Date of birth: [birth year]  SUBJECTIVE:  Including CC & ROS.  Chief Complaint  Patient presents with  . Nose sore  . B12 deficiency.    Alex Meyers is a 77 y.o. male that is presenting with a nose sore and B12 deficiency. The nose sore has been present for two weeks. Denies bleeding. The sore is located on the inside of his right nostril. Admits to tenderness. Denies fevers. This has been getting worse. Denies any discharge. Has intermittent watery eyes. No trauma to his nose. No discoloration on the exterior surface of his nose.   He has been taking B12 over the counter, he was previously receiving monthly B12 injections.   Review of Systems  Constitutional: Negative for fever.  HENT: Negative for nosebleeds and rhinorrhea.   Respiratory: Negative for cough.   Cardiovascular: Negative for chest pain.    HISTORY: Past Medical, Surgical, Social, and Family History Reviewed & Updated per EMR.   Pertinent Historical Findings include:  Past Medical History:  Diagnosis Date  . Coronary artery disease    s//p DES LAD and Dx1 01/2009  . Diabetes mellitus   . Diverticulosis   . Hx of colonic polyps 08/2010   Dr. Hilarie Fredrickson  . Hyperlipidemia    pt denies  . Hypertension    pt. denies  . Internal hemorrhoids with Grade 2-3 prolapse and bleeding 09/25/2012   Diagnosed by Dr. Linda Hedges via anoscopy   . Multiple thyroid nodules   . Personal history of colonic polyps 08/09/2002   ADENOMATOUS POLYP  . Tubular adenoma of colon     Past Surgical History:  Procedure Laterality Date  . COLONOSCOPY  multiple  . CORONARY STENT PLACEMENT  01/22/2009   right side  . WRIST SURGERY     right side    No Known Allergies  Family History  Problem Relation Age of Onset  . Heart failure Mother   . Coronary artery disease Mother   . Bladder Cancer Father 29  . Breast cancer Daughter   . Colon cancer Neg Hx      Social History   Socioeconomic  History  . Marital status: Divorced    Spouse name: Not on file  . Number of children: 3  . Years of education: Not on file  . Highest education level: Not on file  Occupational History  . Occupation: LAWYER    Employer: [employer]  . Financial resource strain: Not hard at all  . Food insecurity:    Worry: Never true    Inability: Never true  . Transportation needs:    Medical: No    Non-medical: No  Tobacco Use  . Smoking status: Never Smoker  . Smokeless tobacco: Never Used  Substance and Sexual Activity  . Alcohol use: Yes    Alcohol/week: 2.4 oz    Types: 4 Cans of beer per week    Comment: weekly  . Drug use: No  . Sexual activity: Never  Lifestyle  . Physical activity:    Days per week: 5 days    Minutes per session: 110 min  . Stress: To some extent  Relationships  . Social connections:    Talks on phone: More than three times a week    Gets together: More than three times a week    Attends religious service: Not on file    Active member of club or organization: Not on  file    Attends meetings of clubs or organizations: More than 4 times per year    Relationship status: Divorced  . Intimate partner violence:    Fear of current or ex partner: Not on file    Emotionally abused: Not on file    Physically abused: Not on file    Forced sexual activity: Not on file  Other Topics Concern  . Not on file  Social History Narrative   [name], Law school - [employer] . Married '78 the patient is divorced after 84 years of marriage. Has 3 daughters. He is an active attorney, formerly with [employer] but he has recently ('12) gone out on his own. Serially monogamous, does not always practice safe sex.      PHYSICAL EXAM:  VS: BP 118/64 (BP Location: Left Arm, Patient Position: Sitting, Cuff Size: Normal)   Pulse (!) 58   Temp 97.9 F (36.6 C) (Oral)   Ht 6' (1.829 m)   Wt 166 lb (75.3 kg)   SpO2 99%   BMI 22.51 kg/m  Physical  Exam Gen: NAD, alert, cooperative with exam,  ENT: normal lips, normal nasal mucosa, tympanic membranes unable to be visualized, swelling of the right nostril and TTP, no abnormality seen in the nasal passages observed.  Eye: normal EOM, normal conjunctiva and lids CV:  no edema, +2 pedal pulses,  Resp: no accessory muscle use, non-labored, Skin: no rashes, no areas of induration  Neuro: normal tone, normal sensation to touch Psych:  normal insight, alert and oriented MSK: Normal gait, normal strength       ASSESSMENT & PLAN:   B12 deficiency History of deficiency  - B12 today   Sore nose Possible for infection vs stone in tear duct. No abnormality on outside of nose but is tender  - he will call to be seen by ENT.

## 2017-10-12 NOTE — Patient Instructions (Signed)
We will call you with the results from today.  Please follow up with an Ear, Nose and Throat doctor if your pain continues

## 2017-10-12 NOTE — Assessment & Plan Note (Signed)
Possible for infection vs stone in tear duct. No abnormality on outside of nose but is tender  - he will call to be seen by ENT.

## 2017-10-12 NOTE — Assessment & Plan Note (Signed)
History of deficiency  - B12 today

## 2017-10-16 DIAGNOSIS — L570 Actinic keratosis: Secondary | ICD-10-CM | POA: Diagnosis not present

## 2017-11-01 ENCOUNTER — Telehealth: Payer: Self-pay

## 2017-11-01 DIAGNOSIS — I2510 Atherosclerotic heart disease of native coronary artery without angina pectoris: Secondary | ICD-10-CM

## 2017-11-01 NOTE — Telephone Encounter (Signed)
Per Dr. Antionette Char 03/20/17 office note,  "Disposition:   FU one year with an exercise treadmill study"  GXT ordered for scheduling. Will have test and OV scheduled same day.

## 2017-11-01 NOTE — Telephone Encounter (Signed)
Scheduled patient March 21, 2018 for same day GXT and OV with Dr. Burt Knack. Instructions mailed to patient. He was grateful for call and agrees with treatment plan.

## 2017-11-24 ENCOUNTER — Encounter: Payer: Self-pay | Admitting: Internal Medicine

## 2017-12-11 ENCOUNTER — Other Ambulatory Visit: Payer: Medicare Other

## 2017-12-11 ENCOUNTER — Ambulatory Visit (INDEPENDENT_AMBULATORY_CARE_PROVIDER_SITE_OTHER): Payer: Medicare Other | Admitting: *Deleted

## 2017-12-11 DIAGNOSIS — Z23 Encounter for immunization: Secondary | ICD-10-CM | POA: Diagnosis not present

## 2017-12-11 DIAGNOSIS — Z0184 Encounter for antibody response examination: Secondary | ICD-10-CM | POA: Diagnosis not present

## 2017-12-11 MED ORDER — ZOSTER VAC RECOMB ADJUVANTED 50 MCG/0.5ML IM SUSR: 0.5000 mL | Freq: Once | INTRAMUSCULAR | 1 refills | Status: AC

## 2017-12-12 LAB — MEASLES/MUMPS/RUBELLA IMMUNITY
Mumps IgG: 41.1 AU/mL
RUBELLA: 30.5 {index}
Rubeola IgG: >300 AU/mL

## 2017-12-18 MED FILL — SHINGRIX 50 MCG SUS: 50 | 1 days supply | Qty: 1 | Fill #0

## 2018-01-18 ENCOUNTER — Ambulatory Visit (INDEPENDENT_AMBULATORY_CARE_PROVIDER_SITE_OTHER): Payer: Medicare Other

## 2018-01-18 ENCOUNTER — Encounter: Payer: Self-pay | Admitting: Podiatry

## 2018-01-18 ENCOUNTER — Ambulatory Visit: Payer: Medicare Other | Admitting: Podiatry

## 2018-01-18 DIAGNOSIS — G5762 Lesion of plantar nerve, left lower limb: Secondary | ICD-10-CM

## 2018-01-18 DIAGNOSIS — M778 Other enthesopathies, not elsewhere classified: Secondary | ICD-10-CM

## 2018-01-18 DIAGNOSIS — M779 Enthesopathy, unspecified: Secondary | ICD-10-CM | POA: Diagnosis not present

## 2018-01-18 DIAGNOSIS — G5761 Lesion of plantar nerve, right lower limb: Secondary | ICD-10-CM

## 2018-01-18 DIAGNOSIS — G5782 Other specified mononeuropathies of left lower limb: Secondary | ICD-10-CM

## 2018-01-18 NOTE — Progress Notes (Signed)
He presents today after have not seen him for the past couple of years states that he continues to run on a regular basis but has noticed pain across the forefoot.  He states the majority of the pain on the left foot is right in here as he points to the third interdigital space of the left foot and across the forefoot on the right foot.  Denies any trauma.  Objective: Vital signs are stable he is alert and oriented x3 pulses are strongly palpable.  Has pain on palpation with a palpable Mulder's click to third interspace of the left foot and pain on range of motion and on palpation of a hypertrophic thickened second metatarsal phalangeal joint of the right foot.  Radiographs taken today demonstrate no acute findings to the left foot however the right foot does demonstrate osteoarthritic process with hammertoe deformity of the second metatarsal phalangeal joint of the right foot.  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  He also has a history of diabetes mellitus with some early diabetic peripheral neuropathy.  He also has neuroma third interspace left foot.  Plan: Discussed etiology pathology conservative surgical therapies at this point injected the bilateral foot each foot was injected after sterile Betadine skin prep utilizing 20 mg of Kenalog 5 mg Marcaine to the point of maximal tenderness.  I will follow-up with him in 1 month to 6 weeks.

## 2018-02-15 ENCOUNTER — Encounter: Payer: Self-pay | Admitting: Podiatry

## 2018-02-15 ENCOUNTER — Ambulatory Visit (INDEPENDENT_AMBULATORY_CARE_PROVIDER_SITE_OTHER): Payer: Medicare Other | Admitting: Podiatry

## 2018-02-15 DIAGNOSIS — G5762 Lesion of plantar nerve, left lower limb: Secondary | ICD-10-CM

## 2018-02-15 DIAGNOSIS — M778 Other enthesopathies, not elsewhere classified: Secondary | ICD-10-CM

## 2018-02-15 DIAGNOSIS — M779 Enthesopathy, unspecified: Secondary | ICD-10-CM

## 2018-02-15 DIAGNOSIS — G5782 Other specified mononeuropathies of left lower limb: Secondary | ICD-10-CM

## 2018-02-15 NOTE — Progress Notes (Signed)
He presents today states that he has noticed that the capsulitis in the neuroma seem to be doing fine but he has noticed numbness spreading from the lateral aspect of his foot across to the medial aspect particularly in the toes.  States that his diabetes is under good control and he is very active running.  Her graph objective: Vital signs are stable he is alert and oriented x3.  Mild tenderness on palpation to the neuroma but does not appear to be resulting in neuropathic type symptoms.  Assessment: Diabetic peripheral neuropathy most likely.  Plan: At this point I encouraged him to keep his blood sugar tight as possible and also started him on NuRemedy.Marland Kitchen

## 2018-02-19 MED FILL — SHINGRIX 50 MCG SUS: 50 | 1 days supply | Qty: 1 | Fill #1

## 2018-02-27 ENCOUNTER — Encounter: Payer: Self-pay | Admitting: Family Medicine

## 2018-02-27 ENCOUNTER — Ambulatory Visit: Payer: Medicare Other | Admitting: Family Medicine

## 2018-02-27 VITALS — BP 110/68 | HR 67 | Temp 98.0°F | Wt 163.2 lb

## 2018-02-27 DIAGNOSIS — E538 Deficiency of other specified B group vitamins: Secondary | ICD-10-CM

## 2018-02-27 DIAGNOSIS — E11628 Type 2 diabetes mellitus with other skin complications: Secondary | ICD-10-CM

## 2018-02-27 DIAGNOSIS — I10 Essential (primary) hypertension: Secondary | ICD-10-CM

## 2018-02-27 DIAGNOSIS — L84 Corns and callosities: Secondary | ICD-10-CM

## 2018-02-27 DIAGNOSIS — I2510 Atherosclerotic heart disease of native coronary artery without angina pectoris: Secondary | ICD-10-CM | POA: Diagnosis not present

## 2018-02-27 DIAGNOSIS — E785 Hyperlipidemia, unspecified: Secondary | ICD-10-CM

## 2018-02-27 NOTE — Progress Notes (Signed)
  Subjective:     Patient ID: Alex Meyers, male   DOB: 01/09/1941, 77 y.o.   MRN: [id number]  HPI Patient seen to establish care. Has been seen over at the [facility] previously. He has past medical history of CAD. He had apparently 2 cardiac stents placed back in 2010. He's done well since that time and has regular follow-up with cardiology.  Other medical problems include history of hypertension, type 2 diabetes ( followed by endocrinology),hyperlipidemia, borderline low B12, and osteoarthritis involving multiple joints.  He tries to run a goal of around 20 miles per week. Still works full-time as a Chief Executive Officer  Multiple medications reviewed.  No recent specific complaints.  No recent chest pain.  Past Medical History:  Diagnosis Date  . Coronary artery disease    s//p DES LAD and Dx1 01/2009  . Diabetes mellitus   . Diverticulosis   . Hx of colonic polyps 08/2010   Dr. Hilarie Fredrickson  . Hyperlipidemia    pt denies  . Hypertension    pt. denies  . Internal hemorrhoids with Grade 2-3 prolapse and bleeding 09/25/2012   Diagnosed by Dr. Linda Hedges via anoscopy   . Multiple thyroid nodules   . Personal history of colonic polyps 08/09/2002   ADENOMATOUS POLYP  . Tubular adenoma of colon    Past Surgical History:  Procedure Laterality Date  . COLONOSCOPY  multiple  . CORONARY STENT PLACEMENT  01/22/2009   right side  . WRIST SURGERY     right side    reports that he has never smoked. He has never used smokeless tobacco. He reports that he drinks about 4.0 standard drinks of alcohol per week. He reports that he does not use drugs. family history includes Bladder Cancer (age of onset: 18) in his father; Breast cancer in his daughter; Coronary artery disease in his mother; Heart failure in his mother. No Known Allergies   Review of Systems  Constitutional: Negative for activity change, appetite change and fever.  HENT: Negative for trouble swallowing.   Respiratory: Negative for cough, shortness  of breath and wheezing.   Cardiovascular: Negative for chest pain and palpitations.  Gastrointestinal: Negative for nausea and vomiting.  Genitourinary: Negative for dysuria and hematuria.  Skin: Negative for rash.  Neurological: Negative for dizziness, syncope and headaches.  Hematological: Negative for adenopathy.  Psychiatric/Behavioral: Negative for dysphoric mood.       Objective:   Physical Exam  Constitutional: He appears well-developed and well-nourished.  Cardiovascular: Normal rate and regular rhythm.  Pulmonary/Chest: Effort normal and breath sounds normal.  Musculoskeletal: He exhibits no edema.  Neurological: He is alert.       Assessment:     #1 history of CAD. Had 2 previous coronary stents placed several years ago and currently doing well with scheduled follow-up with cardiology in September  #2 type 2 diabetes. History of good control with A1c's around 6.5. Followed by endocrinology  #3 history of dyslipidemia  #4 hypertension stable and at goal    Plan:     -we suggested he consider complete physical at some point this Fall -He plans to get flu vaccine later this year  Alex Post MD [facility]

## 2018-02-27 NOTE — Patient Instructions (Signed)
Consider physical at some point later this year.

## 2018-02-28 ENCOUNTER — Encounter: Payer: Self-pay | Admitting: Cardiovascular Disease

## 2018-03-02 ENCOUNTER — Ambulatory Visit
Admission: RE | Admit: 2018-03-02 | Discharge: 2018-03-02 | Disposition: A | Discharge status: Home / Self Care | Payer: Medicare Other | Source: Ambulatory Visit | Attending: Sports Medicine | Admitting: Sports Medicine

## 2018-03-02 ENCOUNTER — Ambulatory Visit (INDEPENDENT_AMBULATORY_CARE_PROVIDER_SITE_OTHER): Payer: Medicare Other | Admitting: Sports Medicine

## 2018-03-02 VITALS — BP 133/74 | Ht 72.0 in | Wt 161.0 lb

## 2018-03-02 DIAGNOSIS — M25512 Pain in left shoulder: Secondary | ICD-10-CM

## 2018-03-02 DIAGNOSIS — S42032A Displaced fracture of lateral end of left clavicle, initial encounter for closed fracture: Secondary | ICD-10-CM | POA: Diagnosis not present

## 2018-03-02 MED ORDER — TRAMADOL HCL 50 MG PO TABS: 50.0000 mg | ORAL_TABLET | Freq: Two times a day (BID) | ORAL | 0 refills | Status: DC

## 2018-03-02 NOTE — Progress Notes (Signed)
HPI  CC: Left shoulder pain  Alex Meyers is a 77 year old male presents today for acute left shoulder pain.  He states he was running last night the dark, and stumbled on his feet and tripped and fell on his left shoulder.  He suffered an abrasion on his left shoulder and his left elbow.  He states that following the injury, he had weakness and overhead activity.  He states he has pain with overhead activity and cannot get past the midpoint of the shoulder.  He states the pain is keeping him up at nighttime.  He has not tried any medication for relief.  He has not had any imaging in the area.  He is denies any trauma prior to the area.  He denies any previous surgeries in the area.  He denies any numbness tingling the arm.  He denies any radiating pain down his arm.  He is able to fully move his elbow without pain.  See HPI and/or previous note for associated ROS.  Objective: BP 133/74   Ht 6' (1.829 m)   Wt 161 lb (73 kg)   BMI 21.84 kg/m  Gen: Right-Hand Dominant. NAD, well groomed, a/o x3, normal affect.  CV: Well-perfused. Warm.  Resp: Non-labored.  Neuro: Sensation intact throughout. No gross coordination deficits.   Left Shoulder exam: No erythema, warmth, swelling noted.  There is an abrasion over the lateral aspect of his elbow.  There is a small abrasion over the lateral aspect of his shoulder as well.  Tenderness to palpation over the posterior shoulder.  Tender palpation of the AC joint.  Range of motion limited to 45 degrees and abduction.  He has full range of motion and passive abduction but with pain.  Range of motion limited to 90 degrees and forward flexion.  He has full range of motion and passive forward flexion, however with pain.  Full range of motion external rotation.  Full range of motion internal rotation.  Strength 4 out of 5 in external rotation.  Strength 4 out of 5 in abduction.  5 out of 5 strength throughout the rest of testing.  Unable to tolerate crossover test,  unable to tolerate speeds test, unable to tolerate it he can test, unable to tolerate Neer's, unable to tolerate Hawkins test.  Apartments of the arm are soft.  2+ radial pulse.  ULTRASOUND: Shoulder, left Diagnostic complete ultrasound imaging obtained of patient's left shoulder.  - No obvious evidence of bony deformity or osteophyte development appreciated.  - Long head of the biceps tendon: No evidence of tendon thickening, calcification, subluxation, or tearing in short or long axis views. No edema or bullseye sign.  - Subscapularis tendon: complete visualization across the width of the insertion point yielded no evidence of tendon thickening, calcification, or tears in the long axis view.  - Supraspinatus tendon: complete visualization across the width of the insertion point showed some minor calcifications at the distal insertion site, as well as  hypoechoic changes.  Some fluid noted around the insertion site. - Infraspinatus and teres minor tendons: visualization across the width of the insertion points show some hypoechoic changes, with some fluid around the tendon insertion site - [facility] Joint: No evidence of joint separation, collapse.  Osteophytes appreciated.  geyser sign present IMPRESSION: findings consistent with rotator cuff tendinopathy of supraspinatus and infraspinatus.  Abnormalities found, note neovessels:  "  Doppler showed no evidence of neovascularization  "   Assessment and plan:  Left shoulder pain, likely involving pathology  of the supraspinatus and infraspinatus.  We discussed with Sky today that we will get an x-ray to evaluate and rule out any humeral head fracture.  I will call him with results of his x-ray.  We will following up in clinic next Tuesday.  If he continues to have pain at that time, we will order an MRI to evaluate the rotator cuff.  We will give him tramadol for pain relief in the interim.  We will give him some range of motion exercises for the  shoulder to work on until that time.  If there is no frank tear on MRI, or he has improvement at today's visit, we can consider starting a formal physical therapy for rehabilitation shoulder.  Lewanda Rife, MD [facility] 03/02/2018 1:03 PM

## 2018-03-06 ENCOUNTER — Ambulatory Visit: Payer: Medicare Other | Admitting: Sports Medicine

## 2018-03-06 VITALS — BP 122/63 | Ht 72.0 in | Wt 163.0 lb

## 2018-03-06 DIAGNOSIS — S42035A Nondisplaced fracture of lateral end of left clavicle, initial encounter for closed fracture: Secondary | ICD-10-CM | POA: Diagnosis not present

## 2018-03-07 ENCOUNTER — Encounter: Payer: Self-pay | Admitting: Sports Medicine

## 2018-03-07 NOTE — Progress Notes (Signed)
Patient ID: Alex Meyers, male   DOB: Dec 13, 1940, 77 y.o.   MRN: [id number]  Patient comes in today for follow-up. X-rays done late last week show a minimally displaced type I distal clavicle fracture of the left shoulder. No proximal humerus fracture. He does have some glenohumeral DJD.  Physical exam shows limited range of motion of the left shoulder secondary to pain. He is tender to palpation directly over the distal clavicle. He is neurovascular intact distally.  Assessment/plan: Minimally displaced type I distal clavicle fracture, left shoulder  Patient will continue in his sling but may remove it occasionally as his pain allows to help prevent adhesive capsulitis.Follow-up in the office in 3 weeks for reevaluation and repeat x-rays. We will get a dedicated clavicle x-ray at follow-up.I anticipate 3-6 weeks in a sling and probably 3 months to heal. Patient is encouraged to call with questions or concerns prior to his follow-up visit.

## 2018-03-07 NOTE — Addendum Note (Signed)
Addended by: Jolinda Croak E on: 03/07/2018 08:52 AM   Modules accepted: Orders

## 2018-03-08 DIAGNOSIS — E538 Deficiency of other specified B group vitamins: Secondary | ICD-10-CM | POA: Diagnosis not present

## 2018-03-08 DIAGNOSIS — E7800 Pure hypercholesterolemia, unspecified: Secondary | ICD-10-CM | POA: Diagnosis not present

## 2018-03-08 DIAGNOSIS — E1159 Type 2 diabetes mellitus with other circulatory complications: Secondary | ICD-10-CM | POA: Diagnosis not present

## 2018-03-12 DIAGNOSIS — I2510 Atherosclerotic heart disease of native coronary artery without angina pectoris: Secondary | ICD-10-CM | POA: Diagnosis not present

## 2018-03-12 DIAGNOSIS — E042 Nontoxic multinodular goiter: Secondary | ICD-10-CM | POA: Diagnosis not present

## 2018-03-12 DIAGNOSIS — E7800 Pure hypercholesterolemia, unspecified: Secondary | ICD-10-CM | POA: Diagnosis not present

## 2018-03-12 DIAGNOSIS — E1159 Type 2 diabetes mellitus with other circulatory complications: Secondary | ICD-10-CM | POA: Diagnosis not present

## 2018-03-13 ENCOUNTER — Telehealth: Payer: Self-pay

## 2018-03-13 ENCOUNTER — Other Ambulatory Visit: Payer: Self-pay

## 2018-03-13 DIAGNOSIS — S42035A Nondisplaced fracture of lateral end of left clavicle, initial encounter for closed fracture: Secondary | ICD-10-CM

## 2018-03-13 DIAGNOSIS — M25512 Pain in left shoulder: Secondary | ICD-10-CM

## 2018-03-13 NOTE — Telephone Encounter (Signed)
X-ray order placed. Pt instructed to get x-ray at his earliest convenience.

## 2018-03-14 ENCOUNTER — Ambulatory Visit
Admission: RE | Admit: 2018-03-14 | Discharge: 2018-03-14 | Disposition: A | Discharge status: Home / Self Care | Payer: Medicare Other | Source: Ambulatory Visit | Attending: Sports Medicine | Admitting: Sports Medicine

## 2018-03-14 DIAGNOSIS — S42002D Fracture of unspecified part of left clavicle, subsequent encounter for fracture with routine healing: Secondary | ICD-10-CM | POA: Diagnosis not present

## 2018-03-14 DIAGNOSIS — S42035A Nondisplaced fracture of lateral end of left clavicle, initial encounter for closed fracture: Secondary | ICD-10-CM

## 2018-03-14 DIAGNOSIS — M25512 Pain in left shoulder: Secondary | ICD-10-CM

## 2018-03-21 ENCOUNTER — Ambulatory Visit: Payer: Medicare Other | Admitting: Cardiovascular Disease

## 2018-03-29 ENCOUNTER — Encounter: Payer: Self-pay | Admitting: Podiatry

## 2018-03-29 ENCOUNTER — Ambulatory Visit: Payer: Medicare Other | Admitting: Podiatry

## 2018-03-29 DIAGNOSIS — E1142 Type 2 diabetes mellitus with diabetic polyneuropathy: Secondary | ICD-10-CM

## 2018-03-29 NOTE — Progress Notes (Signed)
He presents today states that he feels that the new remedy seems to be helping.  Objective: No changes on physical exam.  Assessment: Diabetic peripheral neuropathy with hammertoe deformities.  Plan: Continue the use of the new remedy for total of about 6 months before drawing conclusion.  Follow-up with him as needed

## 2018-04-02 ENCOUNTER — Ambulatory Visit: Payer: Medicare Other | Admitting: Sports Medicine

## 2018-04-04 ENCOUNTER — Ambulatory Visit: Payer: Medicare Other | Admitting: Family Medicine

## 2018-04-13 ENCOUNTER — Encounter: Payer: Self-pay | Admitting: Family Medicine

## 2018-04-13 ENCOUNTER — Ambulatory Visit: Payer: Medicare Other | Admitting: Family Medicine

## 2018-04-13 ENCOUNTER — Other Ambulatory Visit: Payer: Self-pay

## 2018-04-13 VITALS — BP 108/66 | HR 58 | Temp 97.8°F | Ht 72.0 in | Wt 167.5 lb

## 2018-04-13 DIAGNOSIS — Z23 Encounter for immunization: Secondary | ICD-10-CM | POA: Diagnosis not present

## 2018-04-13 DIAGNOSIS — R3915 Urgency of urination: Secondary | ICD-10-CM

## 2018-04-13 DIAGNOSIS — R351 Nocturia: Secondary | ICD-10-CM

## 2018-04-13 NOTE — Progress Notes (Signed)
  Subjective:     Patient ID: Alex Meyers, male   DOB: 1940-10-06, 77 y.o.   MRN: [id number]  HPI Patient is seen with some nocturia and mild urine urgency at night.  He states he has had about 6 to 9 months of getting about 2-3 times at night.  Denies any slow stream.  No burning with urination.  No daytime frequency.  Does not drink alcohol regularly.  Tries to avoid caffeine late in the day.  Does drink a fair amount of fluids late in the day.  No diuretic therapy.  Does take Jardiance.  He had PSA couple years ago which was very low at 0.35.  No diuretic therapy  Past Medical History:  Diagnosis Date  . Coronary artery disease    s//p DES LAD and Dx1 01/2009  . Diabetes mellitus   . Diverticulosis   . Hx of colonic polyps 08/2010   Dr. Hilarie Fredrickson  . Hyperlipidemia    pt denies  . Hypertension    pt. denies  . Internal hemorrhoids with Grade 2-3 prolapse and bleeding 09/25/2012   Diagnosed by Dr. Linda Hedges via anoscopy   . Multiple thyroid nodules   . Personal history of colonic polyps 08/09/2002   ADENOMATOUS POLYP  . Tubular adenoma of colon    Past Surgical History:  Procedure Laterality Date  . COLONOSCOPY  multiple  . CORONARY STENT PLACEMENT  01/22/2009   right side  . WRIST SURGERY     right side    reports that he has never smoked. He has never used smokeless tobacco. He reports that he drinks about 4.0 standard drinks of alcohol per week. He reports that he does not use drugs. family history includes Bladder Cancer (age of onset: 71) in his father; Breast cancer in his daughter; Coronary artery disease in his mother; Heart failure in his mother. No Known Allergies   Review of Systems  Constitutional: Negative for chills and fever.  Genitourinary: Positive for frequency and urgency. Negative for difficulty urinating, dysuria and hematuria.       Objective:   Physical Exam  Constitutional: He appears well-developed and well-nourished.  Cardiovascular: Normal rate and  regular rhythm.  Pulmonary/Chest: Effort normal and breath sounds normal.       Assessment:     Urine frequency/urgency.  Symptoms are confined to nighttime.  He does not describe any obstructive urinary symptoms.  No burning to suggest infection symptoms have gone on for months    Plan:     -Avoid caffeine after about 2 to 3 PM -Reminder to make sure he is taking his Jardiance in the morning -We discussed possible options for treatment such as Myrbetriq and at this point he would like to observe  Eulas Post MD [facility] at [facility]

## 2018-04-13 NOTE — Addendum Note (Signed)
Addended by: Sheffield Slider L on: 04/13/2018 09:01 AM   Modules accepted: Orders

## 2018-04-13 NOTE — Patient Instructions (Signed)

## 2018-04-14 ENCOUNTER — Other Ambulatory Visit: Payer: Self-pay | Admitting: Cardiovascular Disease

## 2018-04-19 ENCOUNTER — Telehealth: Payer: Self-pay | Admitting: *Deleted

## 2018-04-19 NOTE — Progress Notes (Signed)
Cardiology Office Note:    Date:  04/20/2018   ID:  Alex Meyers, DOB 06/27/1941, MRN [id number]  PCP:  Eulas Post, MD  Cardiologist:  Sherren Mocha, MD  Electrophysiologist:  None   Referring MD: Janith Lima, MD   Chief Complaint  Patient presents with  . Follow-up    CAD    History of Present Illness:    Alex Meyers is a 77 y.o. male with coronary artery disease status post drug-eluting stent to the LAD and diagonal in 2010, diabetes, hyperlipidemia.  He was last seen by Dr. Burt Knack in September 2018.     Alex Meyers returns for follow-up on coronary artery disease.  He is also scheduled for a routine follow-up exercise tolerance test today.  He is here alone.  He continues to run competitively.  However, he fell several weeks ago and fractured his clavicle.  He has occasional knee pain as well as shoulder pain and some muscular discomfort.  He denies chest discomfort, shortness of breath, syncope, lower extremity swelling.  Prior CV studies:   The following studies were reviewed today:  Stress echocardiogram 01/21/2016 Stress echo results:     There was no diagnostic evidence for stress-induced ischemia.  GXT 11/21/2014 Normal  Echocardiogram 11/24/2011 EF 60, normal wall motion, ascending aorta 41 mm, aortic root 38 mm  Cardiac catheterization 01/22/2009 LAD proximal 90, proximal mid 40; D1 95 RCA okay LCx okay EF 50-55 PCI: 3.5 x 8 mm Xience DES to the LAD; 2.5 x 15 mm Xience DES to the diagonal  Past Medical History:  Diagnosis Date  . Coronary artery disease    s//p DES LAD and Dx1 01/2009  . Diabetes mellitus   . Diverticulosis   . Hx of colonic polyps 08/2010   Dr. Hilarie Fredrickson  . Hyperlipidemia    pt denies  . Hypertension    pt. denies  . Internal hemorrhoids with Grade 2-3 prolapse and bleeding 09/25/2012   Diagnosed by Dr. Linda Hedges via anoscopy   . Multiple thyroid nodules   . Personal history of colonic polyps 08/09/2002   ADENOMATOUS POLYP    . Tubular adenoma of colon    Surgical Hx: The patient  has a past surgical history that includes Colonoscopy (multiple); Coronary stent placement (01/22/2009); and Wrist surgery.   Current Medications: Current Meds  Medication Sig  . clopidogrel (PLAVIX) 75 MG tablet TAKE 1 TABLET BY MOUTH DAILY  . empagliflozin (JARDIANCE) 10 MG TABS tablet Take by mouth.  . meloxicam (MOBIC) 15 MG tablet TAKE 1 TABLET BY MOUTH EVERY DAY  . metFORMIN (GLUCOPHAGE-XR) 500 MG 24 hr tablet Take 1 tablet by mouth at bedtime.  . pioglitazone (ACTOS) 45 MG tablet Take 45 mg by mouth daily.  . rosuvastatin (CRESTOR) 20 MG tablet Take 1 tablet (20 mg total) by mouth daily.  . sildenafil (REVATIO) 20 MG tablet Take 80 mg by mouth daily as needed (ED).  . sitaGLIPtin (JANUVIA) 100 MG tablet Take 1 tablet by mouth once a day for diabetes  . vitamin B-12 (CYANOCOBALAMIN) 500 MCG tablet Take 1 tablet once a day  . [DISCONTINUED] traMADol (ULTRAM) 50 MG tablet Take 1 tablet (50 mg total) by mouth 2 (two) times daily.     Allergies:   Patient has no known allergies.   Social History   Tobacco Use  . Smoking status: Never Smoker  . Smokeless tobacco: Never Used  Substance Use Topics  . Alcohol use: Yes    Alcohol/week:  4.0 standard drinks    Types: 4 Cans of beer per week    Comment: weekly  . Drug use: No     Family Hx: The patient's family history includes Bladder Cancer (age of onset: 46) in his father; Breast cancer in his daughter; Coronary artery disease in his mother; Heart failure in his mother. There is no history of Colon cancer.  ROS:   Please see the history of present illness.    ROS All other systems reviewed and are negative.   EKGs/Labs/Other Test Reviewed:    EKG:  EKG is  ordered today.  The ekg ordered today demonstrates normal sinus rhythm, heart rate 63, inferior Q waves, anterior Q waves, QTC 440, similar to old EKGs  Recent Labs: 06/22/2017: ALT 16; BUN 19; Creatinine 1.0;  Potassium 4.0; Sodium 139   Recent Lipid Panel Lab Results  Component Value Date/Time   CHOL 132 06/24/2017   TRIG 90 06/24/2017   HDL 66 06/24/2017   CHOLHDL 2 05/24/2016 10:55 AM   LDLCALC 48 06/24/2017    Physical Exam:    VS:  BP (!) 118/56   Pulse 60   Ht 6' (1.829 m)   Wt 169 lb (76.7 kg)   SpO2 95%   BMI 22.92 kg/m     Wt Readings from Last 3 Encounters:  04/20/18 169 lb (76.7 kg)  04/13/18 167 lb 8 oz (76 kg)  03/06/18 163 lb (73.9 kg)     Physical Exam  Constitutional: He is oriented to person, place, and time. He appears well-developed and well-nourished. No distress.  HENT:  Head: Normocephalic and atraumatic.  Neck: No JVD present. Carotid bruit is not present.  Cardiovascular: Normal rate and regular rhythm.  No murmur heard. Pulmonary/Chest: Effort normal. He has no rales.  Abdominal: Soft.  Musculoskeletal: He exhibits no edema.  Neurological: He is alert and oriented to person, place, and time.  Skin: Skin is warm and dry.    ASSESSMENT & PLAN:    Coronary artery disease involving native coronary artery of native heart without angina pectoris History of drug-eluting stent to the LAD and diagonal in 2010.  He remains very active and still runs competitively.  He is not having anginal symptoms.  He is due for a routine treadmill test today.  Continue Clopidogrel, rosuvastatin.  Hyperlipidemia with target LDL less than 70 LDL in December 2018 was 48.  Continue current dose of rosuvastatin.  Arrange fasting CMET, lipids in 1 year prior to next visit.  Dispo:  Return in about 1 year (around 04/21/2019) for Routine Follow Up, w/ Dr. Burt Knack.   Medication Adjustments/Labs and Tests Ordered: Current medicines are reviewed at length with the patient today.  Concerns regarding medicines are outlined above.  Tests Ordered: Orders Placed This Encounter  Procedures  . Lipid Profile  . Comp Met (CMET)  . EKG 12-Lead   Medication Changes: No orders of the  defined types were placed in this encounter.   Signed, Richardson Dopp, PA-C  04/20/2018 9:08 AM    [facility] [address], [place], [place]  [address] Phone: [phone]; Fax: [phone]

## 2018-04-19 NOTE — Telephone Encounter (Signed)
Copied from [place] [id number]. Topic: General - Other >> Apr 18, 2018  4:37 PM Rutherford Nail, NT wrote: Reason for CRM: Patient received Emmi Prevent call about check his kidneys. Would like to know what Dr Elease Hashimoto thinks of this. Is it needed?

## 2018-04-20 ENCOUNTER — Ambulatory Visit (INDEPENDENT_AMBULATORY_CARE_PROVIDER_SITE_OTHER): Payer: Medicare Other

## 2018-04-20 ENCOUNTER — Encounter: Payer: Self-pay | Admitting: Physician Assistant

## 2018-04-20 ENCOUNTER — Ambulatory Visit: Payer: Medicare Other | Admitting: Physician Assistant

## 2018-04-20 VITALS — BP 118/56 | HR 60 | Ht 72.0 in | Wt 169.0 lb

## 2018-04-20 DIAGNOSIS — I2510 Atherosclerotic heart disease of native coronary artery without angina pectoris: Secondary | ICD-10-CM

## 2018-04-20 DIAGNOSIS — E785 Hyperlipidemia, unspecified: Secondary | ICD-10-CM

## 2018-04-20 LAB — EXERCISE TOLERANCE TEST
CHL CUP RESTING HR STRESS: 57 {beats}/min
CSEPEDS: 55 s
CSEPEW: 13.2 METS
Exercise duration (min): 10 min
MPHR: 145 {beats}/min
Peak HR: 125 {beats}/min
Percent HR: 86 %
RPE: 15

## 2018-04-20 NOTE — Patient Instructions (Signed)
Medication Instructions:  1. Your physician recommends that you continue on your current medications as directed. Please refer to the Current Medication list given to you today.  If you need a refill on your cardiac medications before your next appointment, please call your pharmacy.   Lab work: FASTING LAB WORK TO BE DONE 1 WEEK BEFORE YOUR 1 YEAR FOLLOW UP APPT If you have labs (blood work) drawn today and your tests are completely normal, you will receive your results only by: Marland Kitchen MyChart Message (if you have MyChart) OR . A paper copy in the mail If you have any lab test that is abnormal or we need to change your treatment, we will call you to review the results.  Testing/Procedures: NONE ORDERED TODAY  Follow-Up: At [facility], you and your health needs are our priority.  As part of our continuing mission to provide you with exceptional heart care, we have created designated Provider Care Teams.  These Care Teams include your primary Cardiologist (physician) and Advanced Practice Providers (APPs -  Physician Assistants and Nurse Practitioners) who all work together to provide you with the care you need, when you need it. You will need a follow up appointment in:  1 years.  Please call our office 2 months in advance to schedule this appointment.  You may see Sherren Mocha, MD or one of the following Advanced Practice Providers on your designated Care Team: Richardson Dopp, PA-C [place], [place] . Daune Perch, NP  Any Other Special Instructions Will Be Listed Below (If Applicable).

## 2018-04-20 NOTE — Telephone Encounter (Signed)
Spoke with patient and an appointment scheduled with Dr Elease Hashimoto.

## 2018-05-10 ENCOUNTER — Other Ambulatory Visit: Payer: Self-pay | Admitting: Cardiovascular Disease

## 2018-05-10 DIAGNOSIS — E1159 Type 2 diabetes mellitus with other circulatory complications: Secondary | ICD-10-CM | POA: Diagnosis not present

## 2018-05-10 DIAGNOSIS — E7800 Pure hypercholesterolemia, unspecified: Secondary | ICD-10-CM | POA: Diagnosis not present

## 2018-05-11 DIAGNOSIS — E042 Nontoxic multinodular goiter: Secondary | ICD-10-CM | POA: Diagnosis not present

## 2018-05-11 DIAGNOSIS — E7800 Pure hypercholesterolemia, unspecified: Secondary | ICD-10-CM | POA: Diagnosis not present

## 2018-05-11 DIAGNOSIS — I2510 Atherosclerotic heart disease of native coronary artery without angina pectoris: Secondary | ICD-10-CM | POA: Diagnosis not present

## 2018-05-11 DIAGNOSIS — E1159 Type 2 diabetes mellitus with other circulatory complications: Secondary | ICD-10-CM | POA: Diagnosis not present

## 2018-05-25 ENCOUNTER — Ambulatory Visit: Payer: Medicare Other

## 2018-06-12 DIAGNOSIS — H01022 Squamous blepharitis right lower eyelid: Secondary | ICD-10-CM | POA: Diagnosis not present

## 2018-06-12 DIAGNOSIS — E119 Type 2 diabetes mellitus without complications: Secondary | ICD-10-CM | POA: Diagnosis not present

## 2018-06-12 DIAGNOSIS — H2513 Age-related nuclear cataract, bilateral: Secondary | ICD-10-CM | POA: Diagnosis not present

## 2018-06-12 DIAGNOSIS — H01021 Squamous blepharitis right upper eyelid: Secondary | ICD-10-CM | POA: Diagnosis not present

## 2018-06-12 LAB — HM DIABETES EYE EXAM

## 2018-06-15 ENCOUNTER — Other Ambulatory Visit: Payer: Self-pay

## 2018-06-15 ENCOUNTER — Ambulatory Visit: Payer: Medicare Other | Admitting: Family Medicine

## 2018-06-15 ENCOUNTER — Encounter: Payer: Self-pay | Admitting: Family Medicine

## 2018-06-15 VITALS — BP 108/70 | HR 60 | Temp 97.8°F | Ht 72.0 in | Wt 170.1 lb

## 2018-06-15 DIAGNOSIS — L600 Ingrowing nail: Secondary | ICD-10-CM | POA: Diagnosis not present

## 2018-06-15 DIAGNOSIS — R233 Spontaneous ecchymoses: Secondary | ICD-10-CM | POA: Diagnosis not present

## 2018-06-15 DIAGNOSIS — R195 Other fecal abnormalities: Secondary | ICD-10-CM | POA: Diagnosis not present

## 2018-06-15 NOTE — Patient Instructions (Signed)
Consider fiber supplement such as Fibercon or Metamucil- but be sure to drink plenty of fluids.  Consider vaseline use on toe to help reduce friction.

## 2018-06-15 NOTE — Progress Notes (Signed)
Subjective:     Patient ID: Alex Meyers, male   DOB: 05-26-1941, 77 y.o.   MRN: [id number]  HPI Patient is seen for the following several items  Spontaneous bruising on the backs of his hands and forearms intermittently.  He takes Plavix regularly.  He has noted this intermittently over the past year or 2.  Does not have any generalized bruising or any other areas of bleeding such as gum bleeding or nosebleeds.  Second issue is he has had some irritation right fourth toe.  He has had slightly ingrown nail intermittently.  Aggravated by running.  No redness or drainage.  Third issue is he has had some recent mild fecal soiling when running.  He had been taking stool softener up to a couple weeks ago but is had some recent slightly looser stools.  No bloody stools.  He had colonoscopy 2016.  He thinks his sphincter may be somewhat less competent than previous but is not having any general incontinence at rest.  No perianal anesthesia.   No low back pain  Past Medical History:  Diagnosis Date  . Coronary artery disease    s//p DES LAD and Dx1 01/2009  . Diabetes mellitus   . Diverticulosis   . Hx of colonic polyps 08/2010   Dr. Hilarie Fredrickson  . Hyperlipidemia    pt denies  . Hypertension    pt. denies  . Internal hemorrhoids with Grade 2-3 prolapse and bleeding 09/25/2012   Diagnosed by Dr. Linda Hedges via anoscopy   . Multiple thyroid nodules   . Personal history of colonic polyps 08/09/2002   ADENOMATOUS POLYP  . Tubular adenoma of colon    Past Surgical History:  Procedure Laterality Date  . COLONOSCOPY  multiple  . CORONARY STENT PLACEMENT  01/22/2009   right side  . WRIST SURGERY     right side    reports that he has never smoked. He has never used smokeless tobacco. He reports current alcohol use of about 4.0 standard drinks of alcohol per week. He reports that he does not use drugs. family history includes Bladder Cancer (age of onset: 69) in his father; Breast cancer in his daughter;  Coronary artery disease in his mother; Heart failure in his mother. No Known Allergies   Review of Systems  Constitutional: Negative for appetite change and unexpected weight change.  Respiratory: Negative for shortness of breath.   Cardiovascular: Negative for chest pain.  Gastrointestinal: Negative for abdominal pain, blood in stool, diarrhea, nausea, rectal pain and vomiting.  Hematological: Negative for adenopathy. Bruises/bleeds easily.       Objective:   Physical Exam Constitutional:      Appearance: Normal appearance.  Neck:     Musculoskeletal: Normal range of motion and neck supple.  Cardiovascular:     Rate and Rhythm: Normal rate and regular rhythm.  Pulmonary:     Effort: Pulmonary effort is normal.     Breath sounds: Normal breath sounds.  Skin:    Comments: He has a couple bruises on the dorsum of both hands.  Right fourth toe reveals slightly callused area laterally with slightly ingrown nail.  We used some clippers and were able to clip off lengthened edge  Neurological:     Mental Status: He is alert.        Assessment:     #1 spontaneous ecchymosis.  Suspect related to age and Plavix use.  No symptoms from her were some bleeding  #2 recent mild fecal soiling.  #  3 mild ingrown right fourth toenail clipped as above without difficulty    Plan:     -We recommend consider fiber supplement such as FiberCon or Metamucil to see if this can bulk up his stool somewhat and avoid any further use of stool softeners for time being -Consider Vaseline right fourth toe prior to runs -Follow-up for any more generalized bleeding or bruising issues  Eulas Post MD [facility] at [facility]

## 2018-06-29 ENCOUNTER — Other Ambulatory Visit: Payer: Self-pay

## 2018-06-29 ENCOUNTER — Ambulatory Visit: Payer: Medicare Other | Admitting: Family Medicine

## 2018-06-29 ENCOUNTER — Encounter: Payer: Self-pay | Admitting: Family Medicine

## 2018-06-29 VITALS — BP 118/62 | HR 72 | Temp 97.8°F | Ht 72.0 in | Wt 169.8 lb

## 2018-06-29 DIAGNOSIS — J029 Acute pharyngitis, unspecified: Secondary | ICD-10-CM

## 2018-06-29 NOTE — Patient Instructions (Signed)
Sore Throat  A sore throat is pain, burning, irritation, or scratchiness in the throat. When you have a sore throat, you may feel pain or tenderness in your throat when you swallow or talk.  Many things can cause a sore throat, including:   An infection.   Seasonal allergies.   Dryness in the air.   Irritants, such as smoke or pollution.   Radiation treatment to the area.   Gastroesophageal reflux disease (GERD).   A tumor.  A sore throat is often the first sign of another sickness. It may happen with other symptoms, such as coughing, sneezing, fever, and swollen neck glands. Most sore throats go away without medical treatment.  Follow these instructions at home:          Take over-the-counter medicines only as told by your health care provider.  ? If your child has a sore throat, do not give your child aspirin because of the association with Reye syndrome.   Drink enough fluids to keep your urine pale yellow.   Rest as needed.   To help with pain, try:  ? Sipping warm liquids, such as broth, herbal tea, or warm water.  ? Eating or drinking cold or frozen liquids, such as frozen ice pops.  ? Gargling with a salt-water mixture 3-4 times a day or as needed. To make a salt-water mixture, completely dissolve -1 tsp (3-6 g) of salt in 1 cup (237 mL) of warm water.  ? Sucking on hard candy or throat lozenges.  ? Putting a cool-mist humidifier in your bedroom at night to moisten the air.  ? Sitting in the bathroom with the door closed for 5-10 minutes while you run hot water in the shower.   Do not use any products that contain nicotine or tobacco, such as cigarettes, e-cigarettes, and chewing tobacco. If you need help quitting, ask your health care provider.   Wash your hands well and often with soap and water. If soap and water are not available, use hand sanitizer.  Contact a health care provider if:   You have a fever for more than 2-3 days.   You have symptoms that last (are persistent) for more than  2-3 days.   Your throat does not get better within 7 days.   You have a fever and your symptoms suddenly get worse.   Your child who is 3 months to 3 years old has a temperature of 102.2F (39C) or higher.  Get help right away if:   You have difficulty breathing.   You cannot swallow fluids, soft foods, or your saliva.   You have increased swelling in your throat or neck.   You have persistent nausea and vomiting.  Summary   A sore throat is pain, burning, irritation, or scratchiness in the throat. Many things can cause a sore throat.   Take over-the-counter medicines only as told by your health care provider. Do not give your child aspirin.   Drink plenty of fluids, and rest as needed.   Contact a health care provider if your symptoms worsen or your sore throat does not get better within 7 days.  This information is not intended to replace advice given to you by your health care provider. Make sure you discuss any questions you have with your health care provider.  Document Released: 07/28/2004 Document Revised: 11/20/2017 Document Reviewed: 11/20/2017  Elsevier Interactive Patient Education  2019 [employer]

## 2018-06-29 NOTE — Progress Notes (Signed)
  Subjective:     Patient ID: Alex Meyers, male   DOB: 06/23/1941, 77 y.o.   MRN: [id number]  HPI Patient is seen with sore throat.  Onset a few days ago.  Only very minimal nasal congestion.  No cough.  Is not aware of any definite fever.  He was still running up until 2 days ago.  Has had very mild body aches.  No headache.  No nausea or vomiting.  No dyspnea.  No sick contacts.  Past Medical History:  Diagnosis Date  . Coronary artery disease    s//p DES LAD and Dx1 01/2009  . Diabetes mellitus   . Diverticulosis   . Hx of colonic polyps 08/2010   Dr. Hilarie Fredrickson  . Hyperlipidemia    pt denies  . Hypertension    pt. denies  . Internal hemorrhoids with Grade 2-3 prolapse and bleeding 09/25/2012   Diagnosed by Dr. Linda Hedges via anoscopy   . Multiple thyroid nodules   . Personal history of colonic polyps 08/09/2002   ADENOMATOUS POLYP  . Tubular adenoma of colon    Past Surgical History:  Procedure Laterality Date  . COLONOSCOPY  multiple  . CORONARY STENT PLACEMENT  01/22/2009   right side  . WRIST SURGERY     right side    reports that he has never smoked. He has never used smokeless tobacco. He reports current alcohol use of about 4.0 standard drinks of alcohol per week. He reports that he does not use drugs. family history includes Bladder Cancer (age of onset: 87) in his father; Breast cancer in his daughter; Coronary artery disease in his mother; Heart failure in his mother. No Known Allergies   Review of Systems  Constitutional: Positive for fatigue. Negative for chills and fever.  HENT: Positive for sore throat. Negative for ear pain.   Respiratory: Negative for cough and shortness of breath.   Neurological: Negative for headaches.       Objective:   Physical Exam Constitutional:      Appearance: He is well-developed.  HENT:     Right Ear: Tympanic membrane normal.     Left Ear: Tympanic membrane normal.     Mouth/Throat:     Pharynx: Oropharynx is clear.   Tonsils: No tonsillar exudate.  Neck:     Musculoskeletal: Neck supple.     Comments: Minimal anterior cervical adenopathy Cardiovascular:     Rate and Rhythm: Normal rate and regular rhythm.  Pulmonary:     Effort: Pulmonary effort is normal.     Breath sounds: Normal breath sounds. No wheezing or rales.  Neurological:     Mental Status: He is alert.        Assessment:     Sore throat.  Probably viral.  Rapid strep negative.    Plan:     -Symptomatic measures with Tylenol, throat lozenges as needed and plenty of fluids -Follow-up for any fever or any persistent or worsening symptoms  Eulas Post MD [facility]

## 2018-07-10 DIAGNOSIS — E1159 Type 2 diabetes mellitus with other circulatory complications: Secondary | ICD-10-CM | POA: Diagnosis not present

## 2018-07-10 DIAGNOSIS — E538 Deficiency of other specified B group vitamins: Secondary | ICD-10-CM | POA: Diagnosis not present

## 2018-07-10 DIAGNOSIS — E7800 Pure hypercholesterolemia, unspecified: Secondary | ICD-10-CM | POA: Diagnosis not present

## 2018-07-12 DIAGNOSIS — I2510 Atherosclerotic heart disease of native coronary artery without angina pectoris: Secondary | ICD-10-CM | POA: Diagnosis not present

## 2018-07-12 DIAGNOSIS — E7800 Pure hypercholesterolemia, unspecified: Secondary | ICD-10-CM | POA: Diagnosis not present

## 2018-07-12 DIAGNOSIS — Z79899 Other long term (current) drug therapy: Secondary | ICD-10-CM | POA: Diagnosis not present

## 2018-07-12 DIAGNOSIS — E1159 Type 2 diabetes mellitus with other circulatory complications: Secondary | ICD-10-CM | POA: Diagnosis not present

## 2018-07-19 DIAGNOSIS — H903 Sensorineural hearing loss, bilateral: Secondary | ICD-10-CM | POA: Diagnosis not present

## 2018-07-19 DIAGNOSIS — H6121 Impacted cerumen, right ear: Secondary | ICD-10-CM | POA: Diagnosis not present

## 2018-07-19 DIAGNOSIS — H9312 Tinnitus, left ear: Secondary | ICD-10-CM | POA: Diagnosis not present

## 2018-08-27 DIAGNOSIS — E042 Nontoxic multinodular goiter: Secondary | ICD-10-CM | POA: Diagnosis not present

## 2018-08-27 DIAGNOSIS — E1159 Type 2 diabetes mellitus with other circulatory complications: Secondary | ICD-10-CM | POA: Diagnosis not present

## 2018-08-27 DIAGNOSIS — E7800 Pure hypercholesterolemia, unspecified: Secondary | ICD-10-CM | POA: Diagnosis not present

## 2018-08-27 DIAGNOSIS — E538 Deficiency of other specified B group vitamins: Secondary | ICD-10-CM | POA: Diagnosis not present

## 2018-08-27 LAB — LIPID PANEL
Cholesterol: 114 (ref 0–200)
HDL: 64 (ref 35–70)
Triglycerides: 57 (ref 40–160)

## 2018-08-27 LAB — HEPATIC FUNCTION PANEL
ALK PHOS: 42 (ref 25–125)
ALT: 9 — AB (ref 10–40)
AST: 12 — AB (ref 14–40)
BILIRUBIN, TOTAL: 0.8

## 2018-08-27 LAB — BASIC METABOLIC PANEL
BUN: 18 (ref 4–21)
Creatinine: 1 (ref <=1.3)
GLUCOSE: 132
Potassium: 4.3 (ref 3.4–5.3)
Sodium: 139 (ref 137–147)

## 2018-08-27 LAB — VITAMIN B12: VITAMIN B 12: 568

## 2018-08-27 LAB — TSH: TSH: 3.04 (ref <=5.90)

## 2018-09-10 ENCOUNTER — Ambulatory Visit: Payer: Medicare Other | Admitting: Family Medicine

## 2018-11-06 DIAGNOSIS — E1159 Type 2 diabetes mellitus with other circulatory complications: Secondary | ICD-10-CM | POA: Diagnosis not present

## 2018-11-06 DIAGNOSIS — E7800 Pure hypercholesterolemia, unspecified: Secondary | ICD-10-CM | POA: Diagnosis not present

## 2018-11-15 DIAGNOSIS — Z79899 Other long term (current) drug therapy: Secondary | ICD-10-CM | POA: Diagnosis not present

## 2018-11-15 DIAGNOSIS — E538 Deficiency of other specified B group vitamins: Secondary | ICD-10-CM | POA: Diagnosis not present

## 2018-11-15 DIAGNOSIS — E1159 Type 2 diabetes mellitus with other circulatory complications: Secondary | ICD-10-CM | POA: Diagnosis not present

## 2018-11-15 DIAGNOSIS — E7800 Pure hypercholesterolemia, unspecified: Secondary | ICD-10-CM | POA: Diagnosis not present

## 2018-11-15 DIAGNOSIS — I2510 Atherosclerotic heart disease of native coronary artery without angina pectoris: Secondary | ICD-10-CM | POA: Diagnosis not present

## 2018-11-15 DIAGNOSIS — E042 Nontoxic multinodular goiter: Secondary | ICD-10-CM | POA: Diagnosis not present

## 2018-11-23 ENCOUNTER — Other Ambulatory Visit: Payer: Self-pay

## 2018-11-23 ENCOUNTER — Ambulatory Visit (INDEPENDENT_AMBULATORY_CARE_PROVIDER_SITE_OTHER): Payer: Medicare HMO | Admitting: Family Medicine

## 2018-11-23 DIAGNOSIS — N50812 Left testicular pain: Secondary | ICD-10-CM

## 2018-11-23 DIAGNOSIS — Z86010 Personal history of colonic polyps: Secondary | ICD-10-CM

## 2018-11-23 NOTE — Progress Notes (Signed)
Patient ID: Alex Meyers, male   DOB: 1940-09-12, 78 y.o.   MRN: [id number]  This visit type was conducted due to national recommendations for restrictions regarding the COVID-19 pandemic in an effort to limit this patient's exposure and mitigate transmission in our community.   Virtual Visit via Telephone Note  I connected with Alex Meyers on 11/23/18 at  1:45 PM EDT by telephone and verified that I am speaking with the correct person using two identifiers.   I discussed the limitations, risks, security and privacy concerns of performing an evaluation and management service by telephone and the availability of in person appointments. I also discussed with the patient that there may be a patient responsible charge related to this service. The patient expressed understanding and agreed to proceed.  Location patient: home Location provider: work or home office Participants present for the call: patient, provider Patient did not have a visit in the prior 7 days to address this/these issue(s).   History of Present Illness:  Patient called regarding the following items  He had some recent mild left testicular discomfort.  This lasted about 2 days.  No associated swelling.  No recent injury.  After 2 days his pain completely abated.  He has not had any recent dysuria.  No gross hematuria.  Second issue is that he was asking about repeat colonoscopy.  His last colonoscopy was July 2016.  He had adenomatous polyps.  Apparently had 3-year recommended follow-up and is about a year over at this point.  He is asking for referral back for that with the understanding that there has been some backup because of the current pandemic.  He did not denies any recent change in bowel habits.  Still running and tries to run a 10K distance 3-4 times per week   Observations/Objective: Patient sounds cheerful and well on the phone. I do not appreciate any SOB. Speech and thought processing are grossly  intact. Patient reported vitals:  Assessment and Plan:  #1 recent mild left testicular discomfort resolved after 2 days -Follow-up for any recurrent pain or any new symptoms such as swelling.  May need office follow-up for any recurrent pain  #2 history of adenomatous colon polyps -We will set up referral for repeat colonoscopy.  He understands there may be some delays  Follow Up Instructions:  -As above   [id number] [id number] [id number] 11-20 9443 21-30 I did not refer this patient for an OV in the next 24 hours for this/these issue(s).  I discussed the assessment and treatment plan with the patient. The patient was provided an opportunity to ask questions and all were answered. The patient agreed with the plan and demonstrated an understanding of the instructions.   The patient was advised to call back or seek an in-person evaluation if the symptoms worsen or if the condition fails to improve as anticipated.  I provided 15 minutes of non-face-to-face time during this encounter.   Carolann Littler, MD

## 2018-12-10 ENCOUNTER — Encounter: Payer: Self-pay | Admitting: General Surgery

## 2018-12-11 ENCOUNTER — Other Ambulatory Visit: Payer: Self-pay

## 2018-12-11 ENCOUNTER — Ambulatory Visit (INDEPENDENT_AMBULATORY_CARE_PROVIDER_SITE_OTHER): Payer: Medicare HMO | Admitting: Internal Medicine

## 2018-12-11 ENCOUNTER — Encounter: Payer: Self-pay | Admitting: Internal Medicine

## 2018-12-11 VITALS — Ht 72.0 in | Wt 160.0 lb

## 2018-12-11 DIAGNOSIS — Z86010 Personal history of colonic polyps: Secondary | ICD-10-CM

## 2018-12-11 DIAGNOSIS — Z7902 Long term (current) use of antithrombotics/antiplatelets: Secondary | ICD-10-CM

## 2018-12-11 NOTE — Progress Notes (Signed)
   Subjective:   This service was provided via telemedicine, telephone encounter  The patient was located at home The provider was located in provider's GI office. The patient did consent to this telephone visit and is aware of possible charges through their insurance for this visit.   The persons participating in this telemedicine service were the patient and I. Time spent on call: 9 minutes    Patient ID: Alex Meyers, male    DOB: 1941/03/25, 78 y.o.   MRN: [id number]  HPI Alex Meyers is a 78 year old male with a history of adenomatous colon polyps, internal hemorrhoids status post banding, CAD with history of PCI on Plavix, hypertension, hyperlipidemia and diabetes who is seen for follow-up.  Last seen in the office on 06/14/2016  He last had a colonoscopy on 01/08/2015.  This revealed 3 sessile polyps ranging 3 to 5 mm from the descending and sigmoid colon.  These polyps were found to be tubular adenomas.  There was mild diverticulosis in the left colon as well.  He reports he has been doing and feeling well.  He is interested in pursuing his surveillance colonoscopy.  He has been quite active particularly through running.  He is running greater than 20 miles a week.  The coronavirus pandemic has led to more time for activity and so he is running on a daily basis.  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  From a GI perspective he is doing and feeling well.  No change in bowel habit.  No complaint of diarrhea or constipation.  No rectal bleeding.  No upper GI or hepatobiliary complaint.  No upper respiratory complaints or chest pain.  Review of Systems As per HPI, otherwise negative  Current Medications, Allergies, Past Medical History, Past Surgical History, Family History and Social History were reviewed in [employer] record.     Objective:   Physical Exam No PE, no virtual visit     Assessment & Plan:  78 year old male with  a history of adenomatous colon polyps, internal hemorrhoids status post banding, CAD with history of PCI on Plavix, hypertension, hyperlipidemia and diabetes who is seen for follow-up.   1.  Personal history of adenomatous colon polyps --patient is due for surveillance colonoscopy at this time.  He is interested in proceeding.  After discussion of the risk, benefits and alternatives he is agreeable and wishes to proceed.  We will contact Dr. Burt Knack and Dr. Elease Hashimoto regarding holding Plavix. --Colonoscopy in the [facility] --Plavix hold per Dr. Burt Knack and Dr. Elease Hashimoto  Hold Plavix 5 days before procedure - will instruct when and how to resume after procedure. Risks and benefits of procedure including bleeding, perforation, infection, missed lesions, medication reactions and possible hospitalization or surgery if complications occur explained. Additional rare but real risk of cardiovascular event such as heart attack or ischemia/infarct of other organs off Plavix explained and need to seek urgent help if this occurs. Will communicate by phone or EMR with patient's prescribing provider that to confirm holding Plavix is reasonable in this case.

## 2018-12-12 ENCOUNTER — Telehealth: Payer: Self-pay | Admitting: *Deleted

## 2018-12-12 MED ORDER — SUPREP BOWEL PREP KIT 17.5-3.13-1.6 GM/177ML PO SOLN: 1.0000 | ORAL | 0 refills | Status: DC

## 2018-12-12 NOTE — Telephone Encounter (Signed)
Ok to hold clopidogrel x 5 days as requested. Thanks

## 2018-12-12 NOTE — Telephone Encounter (Signed)
Patient now has [employer] HMO. He indicates that his ID is [id number]  RxBIN: [id number] RxPCN: [id number] RxGroup: [id number]

## 2018-12-12 NOTE — Telephone Encounter (Signed)
Request for surgical clearance:     Endoscopy Procedure  What type of surgery is being performed?     colonoscopy  When is this surgery scheduled?     TBD  What type of clearance is required ?   Pharmacy  Are there any medications that need to be held prior to surgery and how long? Plavix, 5 days  Practice name and name of physician performing surgery?      [facility]  What is your office phone and fax number?      Phone- [phone]  [phone]  Anesthesia type (None, local, MAC, general) ?       MAC

## 2018-12-12 NOTE — Addendum Note (Signed)
Addended by: Larina Bras on: 12/12/2018 10:34 AM   Modules accepted: Orders

## 2018-12-12 NOTE — Telephone Encounter (Signed)
   Primary Cardiologist: Sherren Mocha, MD  Chart reviewed as part of pre-operative protocol coverage. Given past medical history and time since last visit, based on ACC/AHA guidelines, YEHIA MCBAIN would be at acceptable risk for the planned procedure without further cardiovascular testing.   Ok to hold Plavix x 5 days.   I will route this recommendation to the requesting party via Epic fax function and remove from pre-op pool.  Please call with questions.  Lyda Jester, PA-C 12/12/2018, 1:52 PM

## 2018-12-12 NOTE — Patient Instructions (Signed)
You have been scheduled for a colonoscopy. Please follow written instructions given to you today.  Please pick up your prep supplies at the pharmacy within the next 1-3 days. If you use inhalers (even only as needed), please bring them with you on the day of your procedure.  You will be contacted by our office prior to your procedure for directions on holding your Plavix.  If you do not hear from our office 1 week prior to your scheduled procedure, please call [phone] to discuss.

## 2018-12-18 NOTE — Telephone Encounter (Signed)
I have spoken to patient to advise that per Dr Burt Knack, he may hold his plavix 5 days prior to his upcoming colonoscopy procedure. Patient verbalizes understanding of this.

## 2018-12-31 ENCOUNTER — Encounter: Payer: Medicare HMO | Admitting: Internal Medicine

## 2019-01-03 ENCOUNTER — Telehealth: Payer: Self-pay | Admitting: Family Medicine

## 2019-01-03 NOTE — Telephone Encounter (Signed)
Please schedule virtual visit with any provider.

## 2019-01-03 NOTE — Telephone Encounter (Signed)
General/Other - Covid Testing  The patient would like to be tested for Covid-19 today but has no symptoms. Please call the patient back.

## 2019-01-03 NOTE — Telephone Encounter (Signed)
LMVM for the patient to contact our office to schedule a virtual visit.

## 2019-01-04 NOTE — Telephone Encounter (Signed)
Patient called and asked about scheduling a covid test. I advised the testing sites are closed today, but that he will need a virtual visit with a provider in order to have the test done. He says make a note that he called and would like the testing done. I advised I will send this to Dr. Elease Hashimoto and someone from the office will call on Monday to schedule a virtual visit.

## 2019-01-11 DIAGNOSIS — E7800 Pure hypercholesterolemia, unspecified: Secondary | ICD-10-CM | POA: Diagnosis not present

## 2019-01-11 DIAGNOSIS — E1159 Type 2 diabetes mellitus with other circulatory complications: Secondary | ICD-10-CM | POA: Diagnosis not present

## 2019-01-14 ENCOUNTER — Encounter: Payer: Medicare HMO | Admitting: Internal Medicine

## 2019-01-14 DIAGNOSIS — E7800 Pure hypercholesterolemia, unspecified: Secondary | ICD-10-CM | POA: Diagnosis not present

## 2019-01-14 DIAGNOSIS — E1159 Type 2 diabetes mellitus with other circulatory complications: Secondary | ICD-10-CM | POA: Diagnosis not present

## 2019-01-14 DIAGNOSIS — Z7984 Long term (current) use of oral hypoglycemic drugs: Secondary | ICD-10-CM | POA: Diagnosis not present

## 2019-01-14 DIAGNOSIS — E042 Nontoxic multinodular goiter: Secondary | ICD-10-CM | POA: Diagnosis not present

## 2019-01-14 DIAGNOSIS — I2510 Atherosclerotic heart disease of native coronary artery without angina pectoris: Secondary | ICD-10-CM | POA: Diagnosis not present

## 2019-01-14 DIAGNOSIS — E538 Deficiency of other specified B group vitamins: Secondary | ICD-10-CM | POA: Diagnosis not present

## 2019-01-15 ENCOUNTER — Ambulatory Visit (INDEPENDENT_AMBULATORY_CARE_PROVIDER_SITE_OTHER): Payer: Medicare HMO | Admitting: Family Medicine

## 2019-01-15 ENCOUNTER — Other Ambulatory Visit: Payer: Self-pay

## 2019-01-15 ENCOUNTER — Telehealth: Payer: Self-pay

## 2019-01-15 ENCOUNTER — Encounter: Payer: Self-pay | Admitting: Family Medicine

## 2019-01-15 VITALS — BP 120/74 | HR 67 | Temp 98.1°F | Ht 72.0 in | Wt 157.5 lb

## 2019-01-15 DIAGNOSIS — Z0184 Encounter for antibody response examination: Secondary | ICD-10-CM | POA: Diagnosis not present

## 2019-01-15 DIAGNOSIS — Z20822 Contact with and (suspected) exposure to covid-19: Secondary | ICD-10-CM

## 2019-01-15 NOTE — Telephone Encounter (Signed)
Attempted to call pt. To schedule for COVID testing.  Left vm. To call [phone] to schedule an appt.  Order placed.

## 2019-01-15 NOTE — Progress Notes (Signed)
  Subjective:     Patient ID: Alex Meyers, male   DOB: 1940/07/06, 78 y.o.   MRN: [id number]  HPI Patient basically came in today requesting consideration for chemo testing.  He states he would like to have both antibody and screening for active disease.  He is basically totally asymptomatic.  He is running about 6 miles per day.  He has an acquaintance that he would like to get together with in [facility] and wants to know his immunity status before seeing them.  He has not had any dyspnea, cough, fever, chills, myalgias, nasal congestion, or any diarrhea.  No known exposures.  Past Medical History:  Diagnosis Date  . Coronary artery disease    s//p DES LAD and Dx1 01/2009  . Diabetes mellitus   . Diverticulosis   . Hx of colonic polyps 08/2010   Dr. Hilarie Fredrickson  . Hyperlipidemia    pt denies  . Hypertension    pt. denies  . Internal hemorrhoids with Grade 2-3 prolapse and bleeding 09/25/2012   Diagnosed by Dr. Linda Hedges via anoscopy   . Multiple thyroid nodules   . Personal history of colonic polyps 08/09/2002   ADENOMATOUS POLYP  . Tubular adenoma of colon    Past Surgical History:  Procedure Laterality Date  . COLONOSCOPY  multiple  . CORONARY STENT PLACEMENT  01/22/2009   right side  . WRIST SURGERY     right side    reports that he has never smoked. He has never used smokeless tobacco. He reports current alcohol use of about 4.0 standard drinks of alcohol per week. He reports that he does not use drugs. family history includes Bladder Cancer (age of onset: 15) in his father; Breast cancer in his daughter; Coronary artery disease in his mother; Heart failure in his mother. No Known Allergies   Review of Systems  Constitutional: Negative for chills and fever.  HENT: Negative for congestion and sore throat.   Respiratory: Negative for cough and shortness of breath.   Gastrointestinal: Negative for diarrhea.  Neurological: Negative for headaches.       Objective:   Physical  Exam Constitutional:      Appearance: Normal appearance.  Cardiovascular:     Rate and Rhythm: Normal rate and regular rhythm.  Pulmonary:     Effort: Pulmonary effort is normal.     Breath sounds: Normal breath sounds. No wheezing or rales.  Neurological:     Mental Status: He is alert.        Assessment:     COVID-19 immunity status.  Patient is requesting IgG and active disease screening, though he is totally asymptomatic.  He is desiring to know his status before travel    Plan:     -We explained that he seems to be very low risk and low clinical suspicion of active infection -We agreed to order COVID-19 IgG antibody and will refer for consideration for testing -We discussed ongoing prevention measures  Eulas Post MD [facility]

## 2019-01-15 NOTE — Telephone Encounter (Signed)
-----   Message from Anibal Henderson, [place] sent at 01/15/2019  1:33 PM EDT ----- Regarding: COVID 19 Test

## 2019-01-16 LAB — SAR COV2 SEROLOGY (COVID19)AB(IGG),IA: SARS CoV2 AB IGG: NEGATIVE

## 2019-01-16 NOTE — Telephone Encounter (Signed)
Contacted pt and he verified that [facility] is the closet site to him. Pt is aware to remain in his car and to wear a mask and that he no longer require an appointment. Pt is aware the testing site is M-F and the hours they are open. Pt understood and had no additional questions at this time.   Lab order was already placed by nurse.

## 2019-01-24 ENCOUNTER — Other Ambulatory Visit: Payer: Self-pay

## 2019-01-24 DIAGNOSIS — Z20822 Contact with and (suspected) exposure to covid-19: Secondary | ICD-10-CM

## 2019-01-24 DIAGNOSIS — R6889 Other general symptoms and signs: Secondary | ICD-10-CM | POA: Diagnosis not present

## 2019-01-28 LAB — NOVEL CORONAVIRUS, NAA: SARS-CoV-2, NAA: NOT DETECTED

## 2019-02-14 ENCOUNTER — Telehealth: Payer: Self-pay | Admitting: Internal Medicine

## 2019-02-14 NOTE — Telephone Encounter (Signed)
Pt requested a call back to discuss upcoming colon 02/19/19.

## 2019-02-14 NOTE — Telephone Encounter (Signed)
Patient needed to know generic name of plavix so he could hold it for procedure. Generic name given to patient. Patient also indicates that he misplaced paperwork again. I advised he can come today to pick up a copy of these instructions (since we wont have time to mail these out again). He indicates he will come today for these.

## 2019-02-18 ENCOUNTER — Telehealth: Payer: Self-pay | Admitting: Internal Medicine

## 2019-02-18 NOTE — Telephone Encounter (Signed)
Pt responded "no" to all screening questions °

## 2019-02-18 NOTE — Telephone Encounter (Signed)

## 2019-02-19 ENCOUNTER — Other Ambulatory Visit: Payer: Self-pay

## 2019-02-19 ENCOUNTER — Encounter: Payer: Self-pay | Admitting: Internal Medicine

## 2019-02-19 ENCOUNTER — Ambulatory Visit (AMBULATORY_SURGERY_CENTER): Payer: Medicare HMO | Admitting: Internal Medicine

## 2019-02-19 VITALS — BP 130/63 | HR 43 | Temp 98.1°F | Resp 15 | Ht 72.0 in | Wt 157.0 lb

## 2019-02-19 DIAGNOSIS — Z86010 Personal history of colonic polyps: Secondary | ICD-10-CM | POA: Diagnosis not present

## 2019-02-19 DIAGNOSIS — E119 Type 2 diabetes mellitus without complications: Secondary | ICD-10-CM | POA: Diagnosis not present

## 2019-02-19 DIAGNOSIS — I2510 Atherosclerotic heart disease of native coronary artery without angina pectoris: Secondary | ICD-10-CM | POA: Diagnosis not present

## 2019-02-19 DIAGNOSIS — D123 Benign neoplasm of transverse colon: Secondary | ICD-10-CM | POA: Diagnosis not present

## 2019-02-19 DIAGNOSIS — E785 Hyperlipidemia, unspecified: Secondary | ICD-10-CM | POA: Diagnosis not present

## 2019-02-19 MED ORDER — SODIUM CHLORIDE 0.9 % IV SOLN: 500.0000 mL | Freq: Once | INTRAVENOUS | Status: DC

## 2019-02-19 NOTE — Progress Notes (Signed)
Pt's states no medical or surgical changes since previsit or office visit. 

## 2019-02-19 NOTE — Patient Instructions (Signed)
Handouts given for hemorrhoids and polyps.  Resume Plavix today at previous dose.  YOU HAD AN ENDOSCOPIC PROCEDURE TODAY AT [facility]:   Refer to the procedure report that was given to you for any specific questions about what was found during the examination.  If the procedure report does not answer your questions, please call your gastroenterologist to clarify.  If you requested that your care partner not be given the details of your procedure findings, then the procedure report has been included in a sealed envelope for you to review at your convenience later.  YOU SHOULD EXPECT: Some feelings of bloating in the abdomen. Passage of more gas than usual.  Walking can help get rid of the air that was put into your GI tract during the procedure and reduce the bloating. If you had a lower endoscopy (such as a colonoscopy or flexible sigmoidoscopy) you may notice spotting of blood in your stool or on the toilet paper. If you underwent a bowel prep for your procedure, you may not have a normal bowel movement for a few days.  Please Note:  You might notice some irritation and congestion in your nose or some drainage.  This is from the oxygen used during your procedure.  There is no need for concern and it should clear up in a day or so.  SYMPTOMS TO REPORT IMMEDIATELY:   Following lower endoscopy (colonoscopy or flexible sigmoidoscopy):  Excessive amounts of blood in the stool  Significant tenderness or worsening of abdominal pains  Swelling of the abdomen that is new, acute  Fever of 100F or higher  For urgent or emergent issues, a gastroenterologist can be reached at any hour by calling [phone].   DIET:  We do recommend a small meal at first, but then you may proceed to your regular diet.  Drink plenty of fluids but you should avoid alcoholic beverages for 24 hours.  ACTIVITY:  You should plan to take it easy for the rest of today and you should NOT DRIVE or use heavy  machinery until tomorrow (because of the sedation medicines used during the test).    FOLLOW UP: Our staff will call the number listed on your records 48-72 hours following your procedure to check on you and address any questions or concerns that you may have regarding the information given to you following your procedure. If we do not reach you, we will leave a message.  We will attempt to reach you two times.  During this call, we will ask if you have developed any symptoms of COVID 19. If you develop any symptoms (ie: fever, flu-like symptoms, shortness of breath, cough etc.) before then, please call [phone].  If you test positive for Covid 19 in the 2 weeks post procedure, please call and report this information to [place].    If any biopsies were taken you will be contacted by phone or by letter within the next 1-3 weeks.  Please call us at [phone] if you have not heard about the biopsies in 3 weeks.    SIGNATURES/CONFIDENTIALITY: You and/or your care partner have signed paperwork which will be entered into your electronic medical record.  These signatures attest to the fact that that the information above on your After Visit Summary has been reviewed and is understood.  Full responsibility of the confidentiality of this discharge information lies with you and/or your care-partner.

## 2019-02-19 NOTE — Progress Notes (Signed)
Report to PACU, RN, vss, BBS= Clear.  

## 2019-02-19 NOTE — Op Note (Signed)
[facility] Patient Name: Edna Grover Procedure Date: 02/19/2019 11:15 AM MRN: [id number] Endoscopist: Jerene Bears , MD Age: 78 Referring MD:  Date of Birth: 08-31-1940 Gender: Male Account #: [id number] Procedure:                Colonoscopy Indications:              High risk colon cancer surveillance: Personal                            history of non-advanced adenoma, Last colonoscopy:                            July 2016 Medicines:                Monitored Anesthesia Care Procedure:                Pre-Anesthesia Assessment:                           - Prior to the procedure, a History and Physical                            was performed, and patient medications and                            allergies were reviewed. The patient's tolerance of                            previous anesthesia was also reviewed. The risks                            and benefits of the procedure and the sedation                            options and risks were discussed with the patient.                            All questions were answered, and informed consent                            was obtained. Prior Anticoagulants: The patient has                            taken Plavix (clopidogrel), last dose was 5 days                            prior to procedure. ASA Grade Assessment: II - A                            patient with mild systemic disease. After reviewing                            the risks and benefits, the patient was deemed in  satisfactory condition to undergo the procedure.                           After obtaining informed consent, the colonoscope                            was passed under direct vision. Throughout the                            procedure, the patient's blood pressure, pulse, and                            oxygen saturations were monitored continuously. The                            Colonoscope was introduced through the anus  and                            advanced to the cecum, identified by appendiceal                            orifice and ileocecal valve. The colonoscopy was                            performed without difficulty. The patient tolerated                            the procedure well. The quality of the bowel                            preparation was good. The ileocecal valve,                            appendiceal orifice, and rectum were photographed. Scope In: 11:22:42 AM Scope Out: 11:40:44 AM Scope Withdrawal Time: 0 hours 14 minutes 41 seconds  Total Procedure Duration: 0 hours 18 minutes 2 seconds  Findings:                 The digital rectal exam was normal.                           Two sessile polyps were found in the transverse                            colon. The polyps were 4 to 5 mm in size. These                            polyps were removed with a cold snare. Resection                            and retrieval were complete.                           Internal hemorrhoids were found during retroflexion.  The exam was otherwise without abnormality. Complications:            No immediate complications. Estimated Blood Loss:     Estimated blood loss was minimal. Impression:               - Two 4 to 5 mm polyps in the transverse colon,                            removed with a cold snare. Resected and retrieved.                           - Internal hemorrhoids.                           - The examination was otherwise normal. Recommendation:           - Patient has a contact number available for                            emergencies. The signs and symptoms of potential                            delayed complications were discussed with the                            patient. Return to normal activities tomorrow.                            Written discharge instructions were provided to the                            patient.                            - Resume previous diet.                           - Continue present medications.                           - Resume Plavix (clopidogrel) at prior dose today.                            Refer to managing physician for further adjustment                            of therapy.                           - Await pathology results.                           - Repeat colonoscopy is recommended. The                            colonoscopy date will be determined after pathology  results from today's exam become available for                            review. Jerene Bears, MD 02/19/2019 11:43:57 AM This report has been signed electronically.

## 2019-02-19 NOTE — Progress Notes (Signed)
Called to room to assist during endoscopic procedure.  Patient ID and intended procedure confirmed with present staff. Received instructions for my participation in the procedure from the performing physician.  

## 2019-02-21 ENCOUNTER — Telehealth: Payer: Self-pay

## 2019-02-21 NOTE — Telephone Encounter (Signed)
  Follow up Call-  Call back number 02/19/2019  Post procedure Call Back phone  # [phone]  Permission to leave phone message Yes  Some recent data might be hidden     Patient questions:  Do you have a fever, pain , or abdominal swelling? No. Pain Score  0 *  Have you tolerated food without any problems? Yes.    Have you been able to return to your normal activities? Yes.    Do you have any questions about your discharge instructions: Diet   No. Medications  No. Follow up visit  No.  Do you have questions or concerns about your Care? No.  Actions: * If pain score is 4 or above: No action needed, pain <4.  1. Have you developed a fever since your procedure? no  2.   Have you had an respiratory symptoms (SOB or cough) since your procedure? no  3.   Have you tested positive for COVID 19 since your procedure no  4.   Have you had any family members/close contacts diagnosed with the COVID 19 since your procedure?  no   If yes to any of these questions please route to Joylene John, RN and Alphonsa Gin, Therapist, sports.

## 2019-02-28 ENCOUNTER — Encounter: Payer: Self-pay | Admitting: Internal Medicine

## 2019-03-04 ENCOUNTER — Telehealth: Payer: Self-pay | Admitting: Family Medicine

## 2019-03-04 NOTE — Telephone Encounter (Signed)
See note

## 2019-03-04 NOTE — Telephone Encounter (Signed)
Doxy has been set for tomorrow morning. Patient used to take a medication for stiff toes and his diabetic doctor discontinued the use of that medication. Patient stated the stiffness is back.

## 2019-03-04 NOTE — Telephone Encounter (Signed)
Patient is requesting a referral to [facility] for max hatt, MD.  [phone] Patient would like call when order is placed Patient call back [phone]

## 2019-03-04 NOTE — Telephone Encounter (Signed)
I don't know why he is requesting.  We can either set up Doxy or call to find out.

## 2019-03-04 NOTE — Telephone Encounter (Signed)
OK for referral? Dx code? (I only see ingrown toenail) Or do I need to set up a Doxy?

## 2019-03-05 ENCOUNTER — Other Ambulatory Visit: Payer: Self-pay

## 2019-03-05 ENCOUNTER — Telehealth (INDEPENDENT_AMBULATORY_CARE_PROVIDER_SITE_OTHER): Payer: Medicare HMO | Admitting: Family Medicine

## 2019-03-05 DIAGNOSIS — M2041 Other hammer toe(s) (acquired), right foot: Secondary | ICD-10-CM | POA: Diagnosis not present

## 2019-03-05 DIAGNOSIS — M2042 Other hammer toe(s) (acquired), left foot: Secondary | ICD-10-CM | POA: Diagnosis not present

## 2019-03-05 NOTE — Progress Notes (Signed)
This visit type was conducted due to national recommendations for restrictions regarding the COVID-19 pandemic in an effort to limit this patient's exposure and mitigate transmission in our community.   Virtual Visit via Telephone Note  I connected with Chrostopher Tellman on 03/05/19 at  8:45 AM EDT by telephone and verified that I am speaking with the correct person using two identifiers.   I discussed the limitations, risks, security and privacy concerns of performing an evaluation and management service by telephone and the availability of in person appointments. I also discussed with the patient that there may be a patient responsible charge related to this service. The patient expressed understanding and agreed to proceed.  Location patient: home Location provider: work or home office Participants present for the call: patient, provider Patient did not have a visit in the prior 7 days to address this/these issue(s).   History of Present Illness: Patient called to discuss some foot issues.  He complains of some "stiffness "in both feet.  He has seen podiatrist in the past and has been diagnosed with hammertoes.  He was basically calling about meloxicam.  He had been placed on this per podiatrist and felt this benefited him greatly.  However, his endocrinologist has advised that he stop this.  He states that he had recent visit with endocrinologist who did approve him to go back on that.  He thinks he has refills at the pharmacy.  He has not had any history of peptic ulcer disease.  Renal function has been normal.  He is running about 20 miles per week and running overall is going very well.  He is not actually having any toe pain when he runs it is more at rest.  He has more stiffness than pain.  Denies any ulceration.  His diabetes has been fairly well controlled.   Observations/Objective: Patient sounds cheerful and well on the phone. I do not appreciate any SOB. Speech and thought processing  are grossly intact. Patient reported vitals:  Assessment and Plan:  Toe stiffness involving multiple toes.  History of hammertoes.  -Discussed pros and cons of nonsteroidal use.  We reminded him about risk of chronic use including peptic ulcer disease and possible renal involvement.  He will cautiously use meloxicam 15 mg daily but try to avoid daily use  Follow Up Instructions:  -As scheduled   99441 5-10 99442 11-20 99443 21-30 I did not refer this patient for an OV in the next 24 hours for this/these issue(s).  I discussed the assessment and treatment plan with the patient. The patient was provided an opportunity to ask questions and all were answered. The patient agreed with the plan and demonstrated an understanding of the instructions.   The patient was advised to call back or seek an in-person evaluation if the symptoms worsen or if the condition fails to improve as anticipated.  I provided 15 minutes of non-face-to-face time during this encounter.   Carolann Littler, MD

## 2019-03-12 DIAGNOSIS — E1159 Type 2 diabetes mellitus with other circulatory complications: Secondary | ICD-10-CM | POA: Diagnosis not present

## 2019-03-12 DIAGNOSIS — E7800 Pure hypercholesterolemia, unspecified: Secondary | ICD-10-CM | POA: Diagnosis not present

## 2019-03-18 DIAGNOSIS — Z79899 Other long term (current) drug therapy: Secondary | ICD-10-CM | POA: Diagnosis not present

## 2019-03-18 DIAGNOSIS — E1159 Type 2 diabetes mellitus with other circulatory complications: Secondary | ICD-10-CM | POA: Diagnosis not present

## 2019-03-18 DIAGNOSIS — E042 Nontoxic multinodular goiter: Secondary | ICD-10-CM | POA: Diagnosis not present

## 2019-03-18 DIAGNOSIS — E7800 Pure hypercholesterolemia, unspecified: Secondary | ICD-10-CM | POA: Diagnosis not present

## 2019-03-18 DIAGNOSIS — E538 Deficiency of other specified B group vitamins: Secondary | ICD-10-CM | POA: Diagnosis not present

## 2019-03-18 DIAGNOSIS — I2510 Atherosclerotic heart disease of native coronary artery without angina pectoris: Secondary | ICD-10-CM | POA: Diagnosis not present

## 2019-03-22 DIAGNOSIS — E042 Nontoxic multinodular goiter: Secondary | ICD-10-CM | POA: Diagnosis not present

## 2019-03-22 DIAGNOSIS — Z23 Encounter for immunization: Secondary | ICD-10-CM | POA: Diagnosis not present

## 2019-03-26 DIAGNOSIS — D225 Melanocytic nevi of trunk: Secondary | ICD-10-CM | POA: Diagnosis not present

## 2019-03-26 DIAGNOSIS — Z85828 Personal history of other malignant neoplasm of skin: Secondary | ICD-10-CM | POA: Diagnosis not present

## 2019-03-26 DIAGNOSIS — C44519 Basal cell carcinoma of skin of other part of trunk: Secondary | ICD-10-CM | POA: Diagnosis not present

## 2019-03-26 DIAGNOSIS — D692 Other nonthrombocytopenic purpura: Secondary | ICD-10-CM | POA: Diagnosis not present

## 2019-03-26 DIAGNOSIS — L570 Actinic keratosis: Secondary | ICD-10-CM | POA: Diagnosis not present

## 2019-03-26 DIAGNOSIS — L821 Other seborrheic keratosis: Secondary | ICD-10-CM | POA: Diagnosis not present

## 2019-03-26 DIAGNOSIS — L738 Other specified follicular disorders: Secondary | ICD-10-CM | POA: Diagnosis not present

## 2019-03-26 DIAGNOSIS — D485 Neoplasm of uncertain behavior of skin: Secondary | ICD-10-CM | POA: Diagnosis not present

## 2019-03-27 ENCOUNTER — Encounter

## 2019-03-28 ENCOUNTER — Ambulatory Visit: Payer: Medicare HMO | Admitting: Podiatry

## 2019-04-03 DIAGNOSIS — C44519 Basal cell carcinoma of skin of other part of trunk: Secondary | ICD-10-CM | POA: Diagnosis not present

## 2019-04-04 ENCOUNTER — Other Ambulatory Visit: Payer: Self-pay | Admitting: Internal Medicine

## 2019-04-04 ENCOUNTER — Other Ambulatory Visit: Payer: Self-pay

## 2019-04-04 ENCOUNTER — Ambulatory Visit: Payer: Medicare HMO | Admitting: Podiatry

## 2019-04-04 ENCOUNTER — Encounter: Payer: Self-pay | Admitting: Podiatry

## 2019-04-04 DIAGNOSIS — E1142 Type 2 diabetes mellitus with diabetic polyneuropathy: Secondary | ICD-10-CM | POA: Diagnosis not present

## 2019-04-05 DIAGNOSIS — L570 Actinic keratosis: Secondary | ICD-10-CM | POA: Diagnosis not present

## 2019-04-06 NOTE — Progress Notes (Signed)
He presents today for follow-up of his diabetic peripheral neuropathy and neuroma.  States that I am still having some numbness in the toes and wanted to follow-up with him let him know about the numbness in the toes.  He states that the meloxicam that he takes seems to help.  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  He also goes on to say that he has increased his running.  States that occasionally the numbness feels that is running to the midfoot.  Objective: Vital signs are stable he is alert and oriented x3 pulses are palpable.  Still has some hammering of the toes with some osteoarthritic changes.  Semmes Weinstein monofilament demonstrates early peripheral neuropathy small fiber neuropathy.  Assessment: Diabetic peripheral neuropathy osteoarthritis toes.  Plan: Continue the use of the meloxicam.  Expressed to him that the numbness will remain in his feet.  This is caused by diabetes and the best that he can do is to keep his diabetes under tight control.  He seems to understand this discussion and will follow-up with [place] on an as-needed basis or in 6 months for diabetic check.

## 2019-04-11 DIAGNOSIS — L570 Actinic keratosis: Secondary | ICD-10-CM | POA: Diagnosis not present

## 2019-04-11 DIAGNOSIS — Z85828 Personal history of other malignant neoplasm of skin: Secondary | ICD-10-CM | POA: Diagnosis not present

## 2019-05-04 ENCOUNTER — Other Ambulatory Visit: Payer: Self-pay | Admitting: Cardiovascular Disease

## 2019-05-17 DIAGNOSIS — E042 Nontoxic multinodular goiter: Secondary | ICD-10-CM | POA: Diagnosis not present

## 2019-05-17 DIAGNOSIS — E1159 Type 2 diabetes mellitus with other circulatory complications: Secondary | ICD-10-CM | POA: Diagnosis not present

## 2019-05-17 DIAGNOSIS — E7800 Pure hypercholesterolemia, unspecified: Secondary | ICD-10-CM | POA: Diagnosis not present

## 2019-05-20 DIAGNOSIS — E7800 Pure hypercholesterolemia, unspecified: Secondary | ICD-10-CM | POA: Diagnosis not present

## 2019-05-20 DIAGNOSIS — E1159 Type 2 diabetes mellitus with other circulatory complications: Secondary | ICD-10-CM | POA: Diagnosis not present

## 2019-05-20 DIAGNOSIS — I2510 Atherosclerotic heart disease of native coronary artery without angina pectoris: Secondary | ICD-10-CM | POA: Diagnosis not present

## 2019-05-20 DIAGNOSIS — Z79899 Other long term (current) drug therapy: Secondary | ICD-10-CM | POA: Diagnosis not present

## 2019-05-20 DIAGNOSIS — E538 Deficiency of other specified B group vitamins: Secondary | ICD-10-CM | POA: Diagnosis not present

## 2019-05-20 DIAGNOSIS — E042 Nontoxic multinodular goiter: Secondary | ICD-10-CM | POA: Diagnosis not present

## 2019-06-10 ENCOUNTER — Other Ambulatory Visit: Payer: Self-pay | Admitting: Cardiovascular Disease

## 2019-06-10 MED ORDER — CLOPIDOGREL BISULFATE 75 MG PO TABS: 75.0000 mg | ORAL_TABLET | Freq: Every day | ORAL | 0 refills | Status: DC

## 2019-06-18 DIAGNOSIS — H16223 Keratoconjunctivitis sicca, not specified as Sjogren's, bilateral: Secondary | ICD-10-CM | POA: Diagnosis not present

## 2019-06-18 DIAGNOSIS — H2513 Age-related nuclear cataract, bilateral: Secondary | ICD-10-CM | POA: Diagnosis not present

## 2019-06-18 DIAGNOSIS — H0102A Squamous blepharitis right eye, upper and lower eyelids: Secondary | ICD-10-CM | POA: Diagnosis not present

## 2019-06-18 DIAGNOSIS — H0102B Squamous blepharitis left eye, upper and lower eyelids: Secondary | ICD-10-CM | POA: Diagnosis not present

## 2019-06-18 DIAGNOSIS — H10413 Chronic giant papillary conjunctivitis, bilateral: Secondary | ICD-10-CM | POA: Diagnosis not present

## 2019-06-18 DIAGNOSIS — E119 Type 2 diabetes mellitus without complications: Secondary | ICD-10-CM | POA: Diagnosis not present

## 2019-06-18 LAB — HM DIABETES EYE EXAM

## 2019-07-02 ENCOUNTER — Ambulatory Visit: Payer: Medicare HMO | Attending: Internal Medicine

## 2019-07-02 DIAGNOSIS — Z20828 Contact with and (suspected) exposure to other viral communicable diseases: Secondary | ICD-10-CM | POA: Diagnosis not present

## 2019-07-02 DIAGNOSIS — Z20822 Contact with and (suspected) exposure to covid-19: Secondary | ICD-10-CM

## 2019-07-03 LAB — NOVEL CORONAVIRUS, NAA: SARS-CoV-2, NAA: NOT DETECTED

## 2019-07-04 ENCOUNTER — Other Ambulatory Visit: Payer: Self-pay | Admitting: *Deleted

## 2019-07-04 MED ORDER — CLOPIDOGREL BISULFATE 75 MG PO TABS: 75.0000 mg | ORAL_TABLET | Freq: Every day | ORAL | 3 refills | Status: DC

## 2019-07-24 DIAGNOSIS — E538 Deficiency of other specified B group vitamins: Secondary | ICD-10-CM | POA: Diagnosis not present

## 2019-07-24 DIAGNOSIS — E7800 Pure hypercholesterolemia, unspecified: Secondary | ICD-10-CM | POA: Diagnosis not present

## 2019-07-24 DIAGNOSIS — E1159 Type 2 diabetes mellitus with other circulatory complications: Secondary | ICD-10-CM | POA: Diagnosis not present

## 2019-07-25 DIAGNOSIS — E1159 Type 2 diabetes mellitus with other circulatory complications: Secondary | ICD-10-CM | POA: Diagnosis not present

## 2019-07-25 DIAGNOSIS — E042 Nontoxic multinodular goiter: Secondary | ICD-10-CM | POA: Diagnosis not present

## 2019-07-25 DIAGNOSIS — Z7984 Long term (current) use of oral hypoglycemic drugs: Secondary | ICD-10-CM | POA: Diagnosis not present

## 2019-07-25 DIAGNOSIS — I2510 Atherosclerotic heart disease of native coronary artery without angina pectoris: Secondary | ICD-10-CM | POA: Diagnosis not present

## 2019-07-25 DIAGNOSIS — E538 Deficiency of other specified B group vitamins: Secondary | ICD-10-CM | POA: Diagnosis not present

## 2019-07-25 DIAGNOSIS — E7800 Pure hypercholesterolemia, unspecified: Secondary | ICD-10-CM | POA: Diagnosis not present

## 2019-08-28 ENCOUNTER — Other Ambulatory Visit: Payer: Self-pay | Admitting: Cardiovascular Disease

## 2019-09-05 DIAGNOSIS — D225 Melanocytic nevi of trunk: Secondary | ICD-10-CM | POA: Diagnosis not present

## 2019-09-05 DIAGNOSIS — D692 Other nonthrombocytopenic purpura: Secondary | ICD-10-CM | POA: Diagnosis not present

## 2019-09-05 DIAGNOSIS — Z85828 Personal history of other malignant neoplasm of skin: Secondary | ICD-10-CM | POA: Diagnosis not present

## 2019-09-05 DIAGNOSIS — L821 Other seborrheic keratosis: Secondary | ICD-10-CM | POA: Diagnosis not present

## 2019-09-05 DIAGNOSIS — L570 Actinic keratosis: Secondary | ICD-10-CM | POA: Diagnosis not present

## 2019-09-05 DIAGNOSIS — L738 Other specified follicular disorders: Secondary | ICD-10-CM | POA: Diagnosis not present

## 2019-09-13 DIAGNOSIS — L570 Actinic keratosis: Secondary | ICD-10-CM | POA: Diagnosis not present

## 2019-09-18 ENCOUNTER — Telehealth: Payer: Self-pay | Admitting: *Deleted

## 2019-09-18 NOTE — Telephone Encounter (Signed)
Pt has been made appt  

## 2019-09-18 NOTE — Telephone Encounter (Signed)
Patient called after hours line. Patient verbalizes he wants this message to get to Dr. Erick Blinks assistant to give him a call back about scheduling.

## 2019-09-20 ENCOUNTER — Ambulatory Visit (INDEPENDENT_AMBULATORY_CARE_PROVIDER_SITE_OTHER): Payer: Medicare HMO | Admitting: Family Medicine

## 2019-09-20 ENCOUNTER — Other Ambulatory Visit: Payer: Self-pay

## 2019-09-20 ENCOUNTER — Encounter: Payer: Self-pay | Admitting: Family Medicine

## 2019-09-20 VITALS — BP 110/70 | HR 60 | Temp 97.8°F | Ht 72.0 in | Wt 167.4 lb

## 2019-09-20 DIAGNOSIS — I10 Essential (primary) hypertension: Secondary | ICD-10-CM | POA: Diagnosis not present

## 2019-09-20 DIAGNOSIS — E118 Type 2 diabetes mellitus with unspecified complications: Secondary | ICD-10-CM | POA: Diagnosis not present

## 2019-09-20 DIAGNOSIS — I2510 Atherosclerotic heart disease of native coronary artery without angina pectoris: Secondary | ICD-10-CM

## 2019-09-20 NOTE — Progress Notes (Signed)
Subjective:     Patient ID: Alex Meyers, male   DOB: January 13, 1941, 79 y.o.   MRN: [id number]  HPI   Lari has history of CAD, hypertension, type 2 diabetes, dyslipidemia.  He is here for basic checkup.  He had both Covid vaccines and did well with those.  He has not been running quite as much recently but plans to start back up  soon.  He has not had any recent chest pains.  Denies any recent falls.  He has lost some weight this year which he attributes to some dietary changes and increased running last year.  He is followed by endocrinology regarding his lipids and diabetes.  His last A1c was 6.6%.  His labs have been stable.  Past Medical History:  Diagnosis Date  . Coronary artery disease    s//p DES LAD and Dx1 01/2009  . Diabetes mellitus   . Diverticulosis   . Hx of colonic polyps 08/2010   Dr. Hilarie Fredrickson  . Hyperlipidemia    pt denies  . Hypertension    pt. denies  . Internal hemorrhoids with Grade 2-3 prolapse and bleeding 09/25/2012   Diagnosed by Dr. Linda Hedges via anoscopy   . Multiple thyroid nodules   . Personal history of colonic polyps 08/09/2002   ADENOMATOUS POLYP  . Tubular adenoma of colon    Past Surgical History:  Procedure Laterality Date  . COLONOSCOPY  multiple  . CORONARY STENT PLACEMENT  01/22/2009   right side  . WRIST SURGERY     right side    reports that he has never smoked. He has never used smokeless tobacco. He reports current alcohol use of about 4.0 standard drinks of alcohol per week. He reports that he does not use drugs. family history includes Bladder Cancer (age of onset: 1) in his father; Breast cancer in his daughter; Coronary artery disease in his mother; Heart failure in his mother. No Known Allergies   Review of Systems  Constitutional: Negative for fatigue.  Eyes: Negative for visual disturbance.  Respiratory: Negative for cough, chest tightness and shortness of breath.   Cardiovascular: Negative for chest pain, palpitations and leg  swelling.  Endocrine: Negative for polydipsia and polyuria.  Neurological: Negative for dizziness, syncope, weakness, light-headedness and headaches.       Objective:   Physical Exam Constitutional:      Appearance: He is well-developed.  HENT:     Right Ear: External ear normal.     Left Ear: External ear normal.  Eyes:     Pupils: Pupils are equal, round, and reactive to light.  Neck:     Thyroid: No thyromegaly.  Cardiovascular:     Rate and Rhythm: Normal rate and regular rhythm.  Pulmonary:     Effort: Pulmonary effort is normal. No respiratory distress.     Breath sounds: Normal breath sounds. No wheezing or rales.  Musculoskeletal:     Cervical back: Neck supple.     Right lower leg: No edema.     Left lower leg: No edema.  Neurological:     Mental Status: He is alert and oriented to person, place, and time.        Assessment:     #1 hypertension stable and at goal  #2 type 2 diabetes which has been well controlled.  This is followed by endocrinology.  Last A1c 6.6%  #3 dyslipidemia treated with Crestor  #4 history of CAD.  No recent chest symptoms    Plan:     -  Continue regular exercise habits -he has scheduled follow up with endocrinology. -routine follow up in 6 months and sooner prn.  Eulas Post MD [facility]

## 2019-09-24 DIAGNOSIS — E7800 Pure hypercholesterolemia, unspecified: Secondary | ICD-10-CM | POA: Diagnosis not present

## 2019-09-24 DIAGNOSIS — E1159 Type 2 diabetes mellitus with other circulatory complications: Secondary | ICD-10-CM | POA: Diagnosis not present

## 2019-09-24 LAB — COMPREHENSIVE METABOLIC PANEL
Albumin: 3.8 (ref 3.5–5.0)
Calcium: 9.7 (ref 8.7–10.7)
GFR calc non Af Amer: 74

## 2019-09-24 LAB — LIPID PANEL
Cholesterol: 202 — AB (ref 0–200)
HDL: 60 (ref 35–70)
LDL Cholesterol: 121
Triglycerides: 112 (ref 40–160)

## 2019-09-24 LAB — BASIC METABOLIC PANEL
BUN: 27 — AB (ref 4–21)
CO2: 26 — AB (ref 13–22)
Chloride: 107 (ref 99–108)
Creatinine: 1 (ref 0.6–1.3)
Glucose: 110
Potassium: 4.7 (ref 3.4–5.3)
Sodium: 141 (ref 137–147)

## 2019-09-24 LAB — HEPATIC FUNCTION PANEL
ALT: 12 (ref 10–40)
AST: 16 (ref 14–40)
Alkaline Phosphatase: 37 (ref 25–125)
Bilirubin, Total: 0.7

## 2019-09-24 LAB — HEMOGLOBIN A1C: Hemoglobin A1C: 6.7

## 2019-09-27 DIAGNOSIS — I2510 Atherosclerotic heart disease of native coronary artery without angina pectoris: Secondary | ICD-10-CM | POA: Diagnosis not present

## 2019-09-27 DIAGNOSIS — E7800 Pure hypercholesterolemia, unspecified: Secondary | ICD-10-CM | POA: Diagnosis not present

## 2019-09-27 DIAGNOSIS — E042 Nontoxic multinodular goiter: Secondary | ICD-10-CM | POA: Diagnosis not present

## 2019-09-27 DIAGNOSIS — Z7984 Long term (current) use of oral hypoglycemic drugs: Secondary | ICD-10-CM | POA: Diagnosis not present

## 2019-09-27 DIAGNOSIS — E538 Deficiency of other specified B group vitamins: Secondary | ICD-10-CM | POA: Diagnosis not present

## 2019-09-27 DIAGNOSIS — E1159 Type 2 diabetes mellitus with other circulatory complications: Secondary | ICD-10-CM | POA: Diagnosis not present

## 2019-09-30 ENCOUNTER — Encounter: Payer: Self-pay | Admitting: Family Medicine

## 2019-10-14 ENCOUNTER — Encounter: Payer: Self-pay | Admitting: Family Medicine

## 2019-10-14 ENCOUNTER — Ambulatory Visit: Payer: Medicare HMO | Admitting: Family Medicine

## 2019-10-14 ENCOUNTER — Other Ambulatory Visit: Payer: Self-pay

## 2019-10-14 VITALS — BP 118/78 | Ht 71.5 in | Wt 161.0 lb

## 2019-10-14 DIAGNOSIS — M75101 Unspecified rotator cuff tear or rupture of right shoulder, not specified as traumatic: Secondary | ICD-10-CM | POA: Insufficient documentation

## 2019-10-14 DIAGNOSIS — S46019A Strain of muscle(s) and tendon(s) of the rotator cuff of unspecified shoulder, initial encounter: Secondary | ICD-10-CM

## 2019-10-14 NOTE — Patient Instructions (Signed)
You strained your rotator cuff (supraspinatus muscle). Avoid overhead exercise for a week to let this rest. Icing 15 minutes at a time 3-4 times a day. Consider voltaren gel up to 4 times a day. Follow up with me as needed - call me if you have any questions or concerns.

## 2019-10-14 NOTE — Assessment & Plan Note (Addendum)
Patient likely has delayed onset muscle soreness from increasing the weight of his workouts.  No concerning signs of rotator cuff tear or pectoralis tendon rupture today.  Patient was counseled to do relative rest for the next week or 2 and to ice the area for 15 minutes about 3 times per day.  He was also encouraged to try Voltaren gel on the area if he is interested.  He should abstain from overhead lifting for the next week, but he can do other activity.  When he does resume overhead lifting, he should decrease his weight by about 50% and then slowly increase it as able.  He can follow-up as needed.

## 2019-10-14 NOTE — Progress Notes (Signed)
    SUBJECTIVE:   CHIEF COMPLAINT / HPI:   Bilateral shoulder pain Duration of 7-10 days.  He thinks that his shoulder pain is due to incline bench pressing more weight than he should have 1 week ago.  His bilateral shoulder pain developed gradually and that following day or 2 after increasing his weight.  He denies any popping or sudden pain during weight lifting.  His right shoulder hurts him worse than the left.  Exacerbating factors include lifting anything and sleeping on his shoulders, alleviating factors include rest.  He says that he has not done any weightlifting since he started feeling pain, and his pain has already improved.  He has not done anything else to alleviate his pain.  He denies any swelling or bruising of the chest or upper extremities.  He denies numbness or tingling.  PERTINENT  PMH / PSH: Type 2 diabetes, hyperlipidemia, coronary artery disease, hypertension  OBJECTIVE:   BP 118/78   Ht 5' 11.5" (1.816 m)   Wt 161 lb (73 kg)   BMI 22.14 kg/m   Shoulder, right: No evidence of bony deformity, asymmetry, or muscle atrophy; No tenderness over long head of biceps (bicipital groove). No TTP at [facility] joint.  He does report tenderness at the insertion of the supraspinatus on the lateral shoulder.  Full active and passive range of motion (180 flex Huel Cote /150Abd /90ER /70IR), Thumb to T12 without significant tenderness. Strength 5/5 throughout. No abnormal scapular function observed. Sensation intact. Peripheral pulses intact.  Special Tests:   - Empty can: Some pain, but no weakness   - Hawkins: POS   - Neer test: NEG   - Obrien's test: NEG   - Yergason's: NEG   - Speeds test: NEG Shoulder, left: No evidence of bony deformity, asymmetry, or muscle atrophy; No tenderness over long head of biceps (bicipital groove). No TTP at [facility] joint.  Mild tenderness to palpation at the insertion of the supraspinatus.  Full active and passive range of motion (180 flex Huel Cote /150Abd  /90ER /70IR), Thumb to T12 without significant tenderness. Strength 5/5 throughout. No abnormal scapular function observed. Sensation intact. Peripheral pulses intact.  Special Tests:   - Empty can: Some pain, but no weakness   - Hawkins: POS   - Neer test: NEG   - Yergason's: NEG   - Speeds test: NEG  ASSESSMENT/PLAN:   Strain of rotator cuff capsule Patient likely has delayed onset muscle soreness from increasing the weight of his workouts.  No concerning signs of rotator cuff tear or pectoralis tendon rupture today.  Patient was counseled to do relative rest for the next week or 2 and to ice the area for 15 minutes about 3 times per day.  He was also encouraged to try Voltaren gel on the area if he is interested.  He should abstain from overhead lifting for the next week, but he can do other activity.  When he does resume overhead lifting, he should decrease his weight by about 50% and then slowly increase it as able.  He can follow-up as needed.     Kathrene Alu, MD [place]

## 2019-11-14 ENCOUNTER — Telehealth: Payer: Self-pay | Admitting: Family Medicine

## 2019-11-14 NOTE — Chronic Care Management (AMB) (Signed)
  Chronic Care Management   Outreach Note  11/14/2019 Name: Alex Meyers MRN: [id number] DOB: [birth year]  Referred by: Eulas Post, MD Reason for referral : Chronic Care Management   An unsuccessful telephone outreach was attempted today. The patient was referred to the pharmacist for assistance with care management and care coordination.   Follow Up Plan:   SIGN[place]

## 2019-11-19 ENCOUNTER — Ambulatory Visit: Payer: Self-pay

## 2019-11-19 ENCOUNTER — Other Ambulatory Visit: Payer: Self-pay

## 2019-11-19 ENCOUNTER — Ambulatory Visit: Payer: Medicare HMO | Admitting: Sports Medicine

## 2019-11-19 VITALS — BP 112/67 | Ht 71.5 in | Wt 161.0 lb

## 2019-11-19 DIAGNOSIS — G8929 Other chronic pain: Secondary | ICD-10-CM

## 2019-11-19 DIAGNOSIS — M75101 Unspecified rotator cuff tear or rupture of right shoulder, not specified as traumatic: Secondary | ICD-10-CM

## 2019-11-19 DIAGNOSIS — M25511 Pain in right shoulder: Secondary | ICD-10-CM | POA: Diagnosis not present

## 2019-11-19 MED ORDER — NITROGLYCERIN 0.2 MG/HR TD PT24: MEDICATED_PATCH | TRANSDERMAL | 1 refills | Status: DC

## 2019-11-19 NOTE — Progress Notes (Signed)
   Subjective:    Patient ID: Alex Meyers. Alex Meyers    DOB: Jun 21, 1941, 79 y.o.   MRN: [id number]  HPI 79yo male with PMH of CAD and TIIDM presenting for follow-up of bilateral supraspinatus pain, right more than left, seen in clinic one month ago. Pain was initially noted after increasing his incline press weight. He has been resting as directed. 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 He states left shoulder pain has nearly resolved but the right seems to be about the same if not worse. The pain is worse with lifting his arm to the side or in front or up over his head. He denies weakness, numbness, or tingling.   Review of Systems Per HPI    Objective:   Physical Exam  Constitution: NAD, healthy appearing  HENT: [place]/AT MSK: sensation intact. Normal bilateral internal shoulder rotation. Left arm full active ROM, strength 5/5 Right: 5-10 degree lag of right arm with active abduction and flexion. Pain with abduction, flexion, and external rotation. Pain along anterior shoulder with elbow flexion against resistance. Weakness with abduction against resistance +Speeds, +Neers, +Yerguson's, +empty can test, +hawkins normal lift off test Neuro: a&o, pleasant Skin: c/d/i   Bedside US: Right Shoulder + Arthritic changes. Irregularity of humeral head *Right partial biceps tendon tear, significant hypoechoic change on SAX and on LAX looks frayed *partial supraspinatus tear in anterior fibers, posterior fibers appear intact Anterior fibers retracted 1.6 cm but do not gap with motion Note interval view shows what appears to be full thickness in tear area *small partial infraspinatus tear only at very distal insertion to footplate.  *TM is normal *There is mild hypoechoic change over [facility] joint and some narrowing. .   Impression;  Full thickness but partial width tear of supraspinatus tendon;  High grade partial tear of biceps tendon; Possible minor tearing of  insertion of infraspinatus tendon.  High grade tendinopathy and tear of rotator cuff.  Ultrasound and interpretation by Wolfgang Phoenix. Fields, MD      Assessment & Plan:    Partial Rt Biceps Tear Partial Rt Supraspinatus Tear Minimal Rt infraspinatus tear  Physical exam and ultrasound consistent with the above. As this is a partial tear encouraged proceeding first with exercise and therapy prior to looking into surgical options.   - nitroglycerin patch to right shoulder bid - biceps exercises, rotator cuff exercises - will follow-up to confirm correct exercise positioning - follow-up one month - refrain from other upper extremity exercises, including tennis    Luree Palla A, DO 11/19/2019, 9:46 AM Pager: [phone]  I observed and examined the patient with the resident and agree with assessment and plan.  Note reviewed and modified by me. Ila Mcgill, MD

## 2019-11-19 NOTE — Patient Instructions (Signed)

## 2019-11-19 NOTE — Assessment & Plan Note (Signed)
We will try a HEP and NTG protocol first His tear is fairly high grade Refer to PT if not making good progress Now still has pretty good strength to testing  Repeat US on RTC  Surgical referral if weakness or more retraction become significant

## 2019-11-26 DIAGNOSIS — E7800 Pure hypercholesterolemia, unspecified: Secondary | ICD-10-CM | POA: Diagnosis not present

## 2019-11-26 DIAGNOSIS — E1159 Type 2 diabetes mellitus with other circulatory complications: Secondary | ICD-10-CM | POA: Diagnosis not present

## 2019-11-27 DIAGNOSIS — E7800 Pure hypercholesterolemia, unspecified: Secondary | ICD-10-CM | POA: Diagnosis not present

## 2019-11-27 DIAGNOSIS — E042 Nontoxic multinodular goiter: Secondary | ICD-10-CM | POA: Diagnosis not present

## 2019-11-27 DIAGNOSIS — Z7984 Long term (current) use of oral hypoglycemic drugs: Secondary | ICD-10-CM | POA: Diagnosis not present

## 2019-11-27 DIAGNOSIS — E538 Deficiency of other specified B group vitamins: Secondary | ICD-10-CM | POA: Diagnosis not present

## 2019-11-27 DIAGNOSIS — E1159 Type 2 diabetes mellitus with other circulatory complications: Secondary | ICD-10-CM | POA: Diagnosis not present

## 2019-11-27 DIAGNOSIS — I2510 Atherosclerotic heart disease of native coronary artery without angina pectoris: Secondary | ICD-10-CM | POA: Diagnosis not present

## 2019-11-27 LAB — BASIC METABOLIC PANEL
BUN: 20 (ref 4–21)
CO2: 28 — AB (ref 13–22)
Chloride: 106 (ref 99–108)
Creatinine: 0.9 (ref 0.6–1.3)
Glucose: 133
Potassium: 4.5 (ref 3.4–5.3)
Sodium: 141 (ref 137–147)

## 2019-11-27 LAB — HEPATIC FUNCTION PANEL
ALT: 13 (ref 10–40)
AST: 21 (ref 14–40)
Alkaline Phosphatase: 33 (ref 25–125)
Bilirubin, Total: 0.7

## 2019-11-27 LAB — COMPREHENSIVE METABOLIC PANEL
Albumin: 4 (ref 3.5–5.0)
Calcium: 9.7 (ref 8.7–10.7)
GFR calc Af Amer: 78

## 2019-11-27 LAB — HEMOGLOBIN A1C: Hemoglobin A1C: 6.8

## 2019-11-27 LAB — LIPID PANEL
Cholesterol: 134 (ref 0–200)
HDL: 66 (ref 35–70)
LDL Cholesterol: 54
Triglycerides: 77 (ref 40–160)

## 2019-12-04 ENCOUNTER — Telehealth: Payer: Self-pay | Admitting: Family Medicine

## 2019-12-04 NOTE — Progress Notes (Signed)
°  Chronic Care Management   Outreach Note  12/04/2019 Name: Alex Meyers MRN: [id number] DOB: 1940/12/20  Referred by: Eulas Post, MD Reason for referral : No chief complaint on file.   A second unsuccessful telephone outreach was attempted today. The patient was referred to pharmacist for assistance with care management and care coordination.  This note is not being shared with the patient for the following reason: To respect privacy (The patient or proxy has requested that the information not be shared).   Follow Up Plan:   [place]

## 2019-12-14 ENCOUNTER — Encounter: Payer: Self-pay | Admitting: Family Medicine

## 2019-12-17 ENCOUNTER — Ambulatory Visit: Payer: Medicare HMO | Admitting: Sports Medicine

## 2019-12-18 ENCOUNTER — Telehealth: Payer: Self-pay | Admitting: Family Medicine

## 2019-12-18 NOTE — Progress Notes (Signed)
  Chronic Care Management   Outreach Note  12/18/2019 Name: Alex Meyers MRN: [id number] DOB: May 30, 1941  Referred by: Eulas Post, MD Reason for referral : No chief complaint on file.   Third unsuccessful telephone outreach was attempted today. The patient was referred to the pharmacist for assistance with care management and care coordination.   Follow Up Plan:   [place]

## 2019-12-26 DIAGNOSIS — L821 Other seborrheic keratosis: Secondary | ICD-10-CM | POA: Diagnosis not present

## 2019-12-26 DIAGNOSIS — D225 Melanocytic nevi of trunk: Secondary | ICD-10-CM | POA: Diagnosis not present

## 2019-12-26 DIAGNOSIS — D2239 Melanocytic nevi of other parts of face: Secondary | ICD-10-CM | POA: Diagnosis not present

## 2019-12-26 DIAGNOSIS — D2271 Melanocytic nevi of right lower limb, including hip: Secondary | ICD-10-CM | POA: Diagnosis not present

## 2019-12-26 DIAGNOSIS — D2261 Melanocytic nevi of right upper limb, including shoulder: Secondary | ICD-10-CM | POA: Diagnosis not present

## 2019-12-26 DIAGNOSIS — L738 Other specified follicular disorders: Secondary | ICD-10-CM | POA: Diagnosis not present

## 2019-12-26 DIAGNOSIS — D692 Other nonthrombocytopenic purpura: Secondary | ICD-10-CM | POA: Diagnosis not present

## 2019-12-26 DIAGNOSIS — Z85828 Personal history of other malignant neoplasm of skin: Secondary | ICD-10-CM | POA: Diagnosis not present

## 2019-12-30 ENCOUNTER — Other Ambulatory Visit: Payer: Self-pay | Admitting: Cardiovascular Disease

## 2020-01-02 ENCOUNTER — Ambulatory Visit: Payer: Self-pay

## 2020-01-02 ENCOUNTER — Other Ambulatory Visit: Payer: Self-pay

## 2020-01-02 ENCOUNTER — Encounter: Payer: Self-pay | Admitting: Podiatry

## 2020-01-02 ENCOUNTER — Ambulatory Visit: Payer: Medicare HMO | Admitting: Podiatry

## 2020-01-02 ENCOUNTER — Ambulatory Visit: Payer: Medicare HMO | Admitting: Sports Medicine

## 2020-01-02 VITALS — BP 130/78 | Ht 71.5 in | Wt 161.0 lb

## 2020-01-02 DIAGNOSIS — M25511 Pain in right shoulder: Secondary | ICD-10-CM | POA: Diagnosis not present

## 2020-01-02 DIAGNOSIS — M75101 Unspecified rotator cuff tear or rupture of right shoulder, not specified as traumatic: Secondary | ICD-10-CM | POA: Diagnosis not present

## 2020-01-02 DIAGNOSIS — E1142 Type 2 diabetes mellitus with diabetic polyneuropathy: Secondary | ICD-10-CM

## 2020-01-02 DIAGNOSIS — G8929 Other chronic pain: Secondary | ICD-10-CM | POA: Diagnosis not present

## 2020-01-02 MED ORDER — MELOXICAM 15 MG PO TABS
15.0000 mg | ORAL_TABLET | Freq: Every day | ORAL | 3 refills | Status: DC
Start: 2020-01-02 — End: 2021-02-15

## 2020-01-02 NOTE — Patient Instructions (Signed)
Cut your NTG patch in half Use 1/2 patch each day Put on each morning  Exercises Try benching with the lightest weight Stay light weight for 1 week  If no pain gradually increase weight Try 3 sets of 10  Curls 3 sets of 10 Start with 20 lbs Build once per week  Shoulder lifts Use 2.5 pounds and do 3 sets of 10 lifts  You may want to see me in 2 months or so and repeat the scan You could hit ground strokes now Serving - wait until we see more healing on scan  Right Your tear was 1.6 cms Now the tear is down to 0.8 cms  Left looked normal

## 2020-01-02 NOTE — Assessment & Plan Note (Signed)
Clear improvement noted on exam and [place]  Will progress his HEP Allow some protected weight lifting Continue NTG and be more consistent with use - 1/2 patch daily  Reck 6 weeks to 8 weeks

## 2020-01-02 NOTE — Progress Notes (Signed)
He presents today as an avid runner for follow-up of his diabetic foot.  He states that he is doing quite well has a little numbness in the toes.  Objective: Vital signs are stable he is alert oriented x3.  Pulses are palpable.  There is no erythema edema cellulitis drainage or odor at this point.  No change in his neuropathic status.  Assessment: Feet are in good shape skin is in good shape some nail dystrophy associated with running.  Diabetes mellitus type 2 with diabetic peripheral neuropathy noncomplicated.  Plan: Follow-up with me on 48-month basis.  We refilled his meloxicam.

## 2020-01-02 NOTE — Progress Notes (Signed)
   PCP: Eulas Post, MD  Subjective:   HPI: Patient is a 79 y.o. male here for f/u of RT shoulder RC tear  Traumatic: - probably from weight lifting)  Location:-pain in upper humerus Quality: - worse with lowering arm or reaching high  Duration: RT was seen 5/18 and left shoulder on 4/12 Timing: - now worse with activity but not at night  He feels at least 50 % improved on RT and has better strength. Has avoided tennis and lifting. Left is 90% better  Previous Interventions Tried: - NTG and HEP  Review of Systems:  Per HPI. Neck is not painful; no neuropathic sxs  [place], medications and smoking status reviewed. - DM could be a factor      Objective:  Physical Exam:  Gen: awake, alert, NAD, comfortable in exam room Pulm: breathing unlabored  Shoulder: RT Inspection reveals no abnormalities, atrophy or asymmetry. Palpation  with tenderness over upper humerus  AC joint or bicipital groove not painful ROM is decreased about 10% on elevation Rotator cuff strength shows supraspin. Weakness but normal otherwise. Painful impingement with  Neer and Hawkin's tests, empty can. Speeds and Yergason's tests normal. No labral pathology noted with negative Obrien's, negative clunk and good stability. Normal scapular function observed. Positive painful arc and  drop arm sign. No apprehension sign  Shoulder Left All above tests repeated but exam was unremarkable  Ultrasound of Right shoulder  There is a distal tear of the supraspinatus noted again with hypoechoic change and retraction The retraction has reduced from 1.6 cm. To about 0.8 cms and there is less hypoechoic change Biceps tendon still shows spitting and hypoechoic change Improved from last exam Infraspinatus and teres minor normal GH arthritis with irregularity of humeral head noted AC joint with narrowing and less of "mushroom sign from before  Note - left shoulder scanned for comparison and unremarkable  findings  Impression: Supraspinatus tear improved appearance on [place]; bicipital tendon tear also improved appearance on ultrasound  Ultrasound and interpretation by Peterson Ao B. Fields, MD     Assessment & Plan:  1. See problem list    Orders Placed This Encounter  Procedures  . [place] COMPLETE JOINT SPACE STRUCTURES UP RIGHT    Standing Status:   Future    Number of Occurrences:   1    Standing Expiration Date:   01/01/2021    Order Specific Question:   Reason for Exam (SYMPTOM  OR DIAGNOSIS REQUIRED)    Answer:   right shoulder pain    Order Specific Question:   Preferred imaging location?    Answer:   Internal

## 2020-01-20 ENCOUNTER — Telehealth: Payer: Self-pay

## 2020-01-20 MED ORDER — CLOPIDOGREL BISULFATE 75 MG PO TABS: 75.0000 mg | ORAL_TABLET | Freq: Every day | ORAL | 1 refills | Status: DC

## 2020-01-20 NOTE — Telephone Encounter (Signed)
The patient came in the office today to schedule an appointment per recall. Instructed the scheduler to arrange appointment next available. Refills called in per request.

## 2020-02-29 ENCOUNTER — Other Ambulatory Visit: Payer: Self-pay | Admitting: Sports Medicine

## 2020-03-04 DIAGNOSIS — E1159 Type 2 diabetes mellitus with other circulatory complications: Secondary | ICD-10-CM | POA: Diagnosis not present

## 2020-03-04 DIAGNOSIS — E7800 Pure hypercholesterolemia, unspecified: Secondary | ICD-10-CM | POA: Diagnosis not present

## 2020-03-05 DIAGNOSIS — E1159 Type 2 diabetes mellitus with other circulatory complications: Secondary | ICD-10-CM | POA: Diagnosis not present

## 2020-03-05 DIAGNOSIS — E538 Deficiency of other specified B group vitamins: Secondary | ICD-10-CM | POA: Diagnosis not present

## 2020-03-05 DIAGNOSIS — Z79899 Other long term (current) drug therapy: Secondary | ICD-10-CM | POA: Diagnosis not present

## 2020-03-05 DIAGNOSIS — E7800 Pure hypercholesterolemia, unspecified: Secondary | ICD-10-CM | POA: Diagnosis not present

## 2020-03-05 DIAGNOSIS — E042 Nontoxic multinodular goiter: Secondary | ICD-10-CM | POA: Diagnosis not present

## 2020-03-05 DIAGNOSIS — I2510 Atherosclerotic heart disease of native coronary artery without angina pectoris: Secondary | ICD-10-CM | POA: Diagnosis not present

## 2020-03-18 ENCOUNTER — Telehealth: Payer: Self-pay | Admitting: Family Medicine

## 2020-03-18 NOTE — Telephone Encounter (Signed)
Okay for referral. Patient seen Dr. Milinda Pointer 04/2019 probably the reason another referral is needed

## 2020-03-18 NOTE — Telephone Encounter (Signed)
OK to refer.

## 2020-03-18 NOTE — Telephone Encounter (Signed)
Pt is calling to say he needs a referral  to [facility] to see (Dr. Milinda Pointer) [phone]  He has seen the Dr before. However, when he tried to make an appointment they told him he needed a referral from his PCP  Please advise

## 2020-03-19 ENCOUNTER — Other Ambulatory Visit: Payer: Self-pay | Admitting: *Deleted

## 2020-03-19 DIAGNOSIS — M79676 Pain in unspecified toe(s): Secondary | ICD-10-CM

## 2020-03-19 NOTE — Progress Notes (Signed)
Referral placed to podiatry per patient request. Patient notified.

## 2020-03-19 NOTE — Telephone Encounter (Signed)
Referral placed and patient notified.  

## 2020-03-26 ENCOUNTER — Ambulatory Visit: Payer: Medicare HMO | Admitting: Podiatry

## 2020-03-26 ENCOUNTER — Ambulatory Visit: Payer: Medicare HMO

## 2020-03-26 ENCOUNTER — Encounter: Payer: Self-pay | Admitting: Podiatry

## 2020-03-26 ENCOUNTER — Other Ambulatory Visit: Payer: Self-pay

## 2020-03-26 DIAGNOSIS — L600 Ingrowing nail: Secondary | ICD-10-CM | POA: Diagnosis not present

## 2020-03-26 DIAGNOSIS — M778 Other enthesopathies, not elsewhere classified: Secondary | ICD-10-CM

## 2020-03-26 MED ORDER — NEOMYCIN-POLYMYXIN-HC 1 % OT SOLN: OTIC | 1 refills | Status: DC

## 2020-03-26 NOTE — Progress Notes (Signed)
He presents today chief complaint of ingrown toenail fibular border hallux left.  States is red and swollen times about a week.  Objective: Vital signs are stable he is alert oriented x3.  Mild erythema lateral border of the hallux left long nail plate.  Sharp incurvated nail margin painful palpation.  No purulence no malodor.  Assessment: Ingrown nail paronychia fibular border  Plan: Chemical matricectomy was performed through border hallux left today he was provided with both oral and written home-going structure for care and soaking of the toe as well as prescription for Corticosporin otic to apply twice daily after soaking.  I will follow-up with him in 2 weeks.

## 2020-03-26 NOTE — Patient Instructions (Signed)

## 2020-03-27 ENCOUNTER — Ambulatory Visit: Payer: Medicare HMO | Admitting: Family Medicine

## 2020-04-07 ENCOUNTER — Other Ambulatory Visit: Payer: Self-pay

## 2020-04-07 ENCOUNTER — Encounter: Payer: Self-pay | Admitting: Family Medicine

## 2020-04-07 ENCOUNTER — Ambulatory Visit (INDEPENDENT_AMBULATORY_CARE_PROVIDER_SITE_OTHER): Payer: Medicare HMO | Admitting: Family Medicine

## 2020-04-07 VITALS — BP 106/48 | HR 64 | Temp 98.3°F | Ht 71.5 in | Wt 161.4 lb

## 2020-04-07 DIAGNOSIS — E11628 Type 2 diabetes mellitus with other skin complications: Secondary | ICD-10-CM | POA: Diagnosis not present

## 2020-04-07 DIAGNOSIS — S80211A Abrasion, right knee, initial encounter: Secondary | ICD-10-CM | POA: Diagnosis not present

## 2020-04-07 DIAGNOSIS — I2510 Atherosclerotic heart disease of native coronary artery without angina pectoris: Secondary | ICD-10-CM

## 2020-04-07 DIAGNOSIS — L84 Corns and callosities: Secondary | ICD-10-CM | POA: Diagnosis not present

## 2020-04-07 NOTE — Progress Notes (Signed)
Established Patient Office Visit  Subjective:  Patient ID: Alex Meyers, male    DOB: [birth year]  Age: 79 y.o. MRN: [id number]  CC: No chief complaint on file.   HPI Alex Meyers presents for routine 39-month follow-up.  He has history of hyperlipidemia, CAD, type 2 diabetes.  He is followed regularly by endocrinology.  He states his most recent A1c was 7.0%.  He has lost about 6 pounds and he attributes this to increased running.  He had a recent fall where he caught his toe on irregular pavement and went down with abrasion right lateral knee.  No evidence of any secondary infection.  This has not slowed down his running.   He has had some bilateral shoulder pain right greater than left and has seen sports medicine.  He had evidence for partial rotator cuff tear.  He continues to have some discomfort in the right shoulder.  He plans to set follow-up with sports medicine soon.  Covid vaccine given.  Needs flu vaccine.  Past Medical History:  Diagnosis Date  . Coronary artery disease    s//p DES LAD and Dx1 01/2009  . Diabetes mellitus   . Diverticulosis   . Hx of colonic polyps 08/2010   Dr. Hilarie Fredrickson  . Hyperlipidemia    pt denies  . Hypertension    pt. denies  . Internal hemorrhoids with Grade 2-3 prolapse and bleeding 09/25/2012   Diagnosed by Dr. Linda Hedges via anoscopy   . Multiple thyroid nodules   . Personal history of colonic polyps 08/09/2002   ADENOMATOUS POLYP  . Tubular adenoma of colon     Past Surgical History:  Procedure Laterality Date  . COLONOSCOPY  multiple  . CORONARY STENT PLACEMENT  01/22/2009   right side  . WRIST SURGERY     right side    Family History  Problem Relation Age of Onset  . Heart failure Mother   . Coronary artery disease Mother   . Bladder Cancer Father 54  . Breast cancer Daughter   . Colon cancer Neg Hx     Social History   Socioeconomic History  . Marital status: Divorced    Spouse name: Not on file  . Number of children: 3    . Years of education: Not on file  . Highest education level: Not on file  Occupational History  . Occupation: LAWYER    Employer: [employer]  Tobacco Use  . Smoking status: Never Smoker  . Smokeless tobacco: Never Used  Vaping Use  . Vaping Use: Never used  Substance and Sexual Activity  . Alcohol use: Yes    Alcohol/week: 4.0 standard drinks    Types: 4 Cans of beer per week    Comment: weekly  . Drug use: No  . Sexual activity: Not Currently  Other Topics Concern  . Not on file  Social History Narrative   [name], Law school - [employer] . Married '78 the patient is divorced after 70 years of marriage. Has 3 daughters. He is an active attorney, formerly with [employer] but he has recently ('12) gone out on his own. Serially monogamous, does not always practice safe sex.    Social Determinants of Health   Financial Resource Strain:   . Difficulty of Paying Living Expenses: Not on file  Food Insecurity:   . Worried About Charity fundraiser in the Last Year: Not on file  . Ran Out of Food in the Last Year: Not  on file  Transportation Needs:   . Lack of Transportation (Medical): Not on file  . Lack of Transportation (Non-Medical): Not on file  Physical Activity:   . Days of Exercise per Week: Not on file  . Minutes of Exercise per Session: Not on file  Stress:   . Feeling of Stress : Not on file  Social Connections:   . Frequency of Communication with Friends and Family: Not on file  . Frequency of Social Gatherings with Friends and Family: Not on file  . Attends Religious Services: Not on file  . Active Member of Clubs or Organizations: Not on file  . Attends Archivist Meetings: Not on file  . Marital Status: Not on file  Intimate Partner Violence:   . Fear of Current or Ex-Partner: Not on file  . Emotionally Abused: Not on file  . Physically Abused: Not on file  . Sexually Abused: Not on file    Outpatient Medications Prior to Visit   Medication Sig Dispense Refill  . clopidogrel (PLAVIX) 75 MG tablet Take 1 tablet (75 mg total) by mouth daily. 90 tablet 1  . cyanocobalamin 1000 MCG tablet Take 2 tablets once a day    . Dapsone 5 % topical gel     . empagliflozin (JARDIANCE) 10 MG TABS tablet Take by mouth.    . Glucosamine HCl 1000 MG TABS Take 1,000 mg by mouth daily.    . meloxicam (MOBIC) 15 MG tablet Take 1 tablet (15 mg total) by mouth daily. 90 tablet 3  . metFORMIN (GLUCOPHAGE-XR) 500 MG 24 hr tablet Take 1 tablet by mouth at bedtime.  12  . NEOMYCIN-POLYMYXIN-HYDROCORTISONE (CORTISPORIN) 1 % SOLN OTIC solution Apply 1-2 drops to toe BID after soaking 10 mL 1  . nitroGLYCERIN (NITRODUR - DOSED IN MG/24 HR) 0.2 mg/hr patch UNWRAP AND APPLY 1/4 PATCH TO THE AFFECTED AREA DAILY 30 patch 1  . pioglitazone (ACTOS) 45 MG tablet Take 45 mg by mouth daily.  11  . rosuvastatin (CRESTOR) 20 MG tablet Take 1 tablet (20 mg total) by mouth daily. 30 tablet 12  . sildenafil (VIAGRA) 50 MG tablet Take 1 tablet as directed as needed    . sitaGLIPtin (JANUVIA) 100 MG tablet Take 1 tablet by mouth once a day for diabetes     No facility-administered medications prior to visit.    No Known Allergies  ROS Review of Systems  Constitutional: Negative for fatigue.  Eyes: Negative for visual disturbance.  Respiratory: Negative for cough, chest tightness and shortness of breath.   Cardiovascular: Negative for chest pain, palpitations and leg swelling.  Endocrine: Negative for polydipsia and polyuria.  Neurological: Negative for dizziness, syncope, weakness, light-headedness and headaches.      Objective:    Physical Exam Vitals reviewed.  Constitutional:      Appearance: Normal appearance.  Cardiovascular:     Rate and Rhythm: Normal rate and regular rhythm.  Pulmonary:     Effort: Pulmonary effort is normal.     Breath sounds: Normal breath sounds.  Skin:    Comments: Abrasion right lateral knee which is approximately  2 x 3 cm.  No surrounding cellulitis changes.  Neurological:     Mental Status: He is alert.     BP (!) 106/48 (BP Location: Left Arm, Patient Position: Sitting, Cuff Size: Normal)   Pulse 64   Temp 98.3 F (36.8 C) (Oral)   Ht 5' 11.5" (1.816 m)   Wt 161 lb 6 oz (  73.2 kg)   SpO2 96%   BMI 22.19 kg/m  Wt Readings from Last 3 Encounters:  04/07/20 161 lb 6 oz (73.2 kg)  01/02/20 161 lb (73 kg)  11/19/19 161 lb (73 kg)     Health Maintenance Due  Topic Date Due  . Hepatitis C Screening  Never done  . URINE MICROALBUMIN  04/06/2018  . FOOT EXAM  06/15/2018  . INFLUENZA VACCINE  02/02/2020    There are no preventive care reminders to display for this patient.  Lab Results  Component Value Date   TSH 3.04 08/27/2018   Lab Results  Component Value Date   WBC 4.4 05/24/2016   HGB 6.5 (A) 04/06/2017   HCT 39.4 05/24/2016   MCV 87.7 05/24/2016   PLT 120.0 (L) 05/24/2016   Lab Results  Component Value Date   NA 141 11/27/2019   K 4.5 11/27/2019   CO2 28 (A) 11/27/2019   GLUCOSE 131 (H) 05/24/2016   BUN 20 11/27/2019   CREATININE 0.9 11/27/2019   BILITOT 1.0 05/24/2016   ALKPHOS 33 11/27/2019   AST 21 11/27/2019   ALT 13 11/27/2019   PROT 5.7 06/24/2017   ALBUMIN 4.0 11/27/2019   CALCIUM 9.7 11/27/2019   ANIONGAP 8 06/24/2017   GFR 76.88 05/24/2016   Lab Results  Component Value Date   CHOL 134 11/27/2019   Lab Results  Component Value Date   HDL 66 11/27/2019   Lab Results  Component Value Date   LDLCALC 54 11/27/2019   Lab Results  Component Value Date   TRIG 77 11/27/2019   Lab Results  Component Value Date   CHOLHDL 2 05/24/2016   Lab Results  Component Value Date   HGBA1C 6.8 11/27/2019      Assessment & Plan:   #1 type 2 diabetes followed by endocrinology with recent A1c 7.0%  #2 history of CAD.  Patient has scheduled follow-up with cardiology in October.  #3 health maintenance.  Flu vaccine given.  #4 abrasion right lateral  knee from recent fall.  No signs of secondary infection.  Clean daily with soap and water.  Follow-up promptly for any redness or other concerns  No orders of the defined types were placed in this encounter.   Follow-up: Return in about 6 months (around 10/06/2020).    Carolann Littler, MD

## 2020-04-09 ENCOUNTER — Ambulatory Visit: Payer: Medicare HMO | Admitting: Podiatry

## 2020-04-14 ENCOUNTER — Ambulatory Visit: Payer: Medicare HMO | Admitting: Podiatry

## 2020-04-20 ENCOUNTER — Telehealth: Payer: Self-pay | Admitting: Family Medicine

## 2020-04-20 NOTE — Telephone Encounter (Signed)
Spoke with patient he stated he would call me back when he get home

## 2020-04-29 ENCOUNTER — Telehealth: Payer: Self-pay | Admitting: Cardiovascular Disease

## 2020-04-29 ENCOUNTER — Ambulatory Visit: Payer: Medicare HMO | Admitting: Cardiovascular Disease

## 2020-04-29 NOTE — Telephone Encounter (Signed)
    Pt said he was speaking with Dr. Burt Knack assistant and trying to r/s his appt with Dr. Burt Knack but the line got cut off. He's calling back. He said he needs to r/s and only wanted to see Dr. Burt Knack

## 2020-04-30 NOTE — Telephone Encounter (Signed)
Scheduled the patient for visit with Dr. Burt Knack 07/06/20. He understands he will be called if an appointment opens before that time. He was grateful for assistance.

## 2020-05-20 DIAGNOSIS — E7800 Pure hypercholesterolemia, unspecified: Secondary | ICD-10-CM | POA: Diagnosis not present

## 2020-05-20 DIAGNOSIS — E1159 Type 2 diabetes mellitus with other circulatory complications: Secondary | ICD-10-CM | POA: Diagnosis not present

## 2020-06-04 DIAGNOSIS — E7800 Pure hypercholesterolemia, unspecified: Secondary | ICD-10-CM | POA: Diagnosis not present

## 2020-06-04 DIAGNOSIS — Z7984 Long term (current) use of oral hypoglycemic drugs: Secondary | ICD-10-CM | POA: Diagnosis not present

## 2020-06-04 DIAGNOSIS — I2510 Atherosclerotic heart disease of native coronary artery without angina pectoris: Secondary | ICD-10-CM | POA: Diagnosis not present

## 2020-06-04 DIAGNOSIS — E1159 Type 2 diabetes mellitus with other circulatory complications: Secondary | ICD-10-CM | POA: Diagnosis not present

## 2020-06-04 DIAGNOSIS — E042 Nontoxic multinodular goiter: Secondary | ICD-10-CM | POA: Diagnosis not present

## 2020-06-04 DIAGNOSIS — E538 Deficiency of other specified B group vitamins: Secondary | ICD-10-CM | POA: Diagnosis not present

## 2020-07-01 ENCOUNTER — Other Ambulatory Visit: Payer: Self-pay

## 2020-07-01 ENCOUNTER — Encounter (INDEPENDENT_AMBULATORY_CARE_PROVIDER_SITE_OTHER): Payer: Self-pay | Admitting: Ophthalmology

## 2020-07-01 ENCOUNTER — Ambulatory Visit (INDEPENDENT_AMBULATORY_CARE_PROVIDER_SITE_OTHER): Payer: Medicare HMO | Admitting: Ophthalmology

## 2020-07-01 DIAGNOSIS — H4311 Vitreous hemorrhage, right eye: Secondary | ICD-10-CM | POA: Diagnosis not present

## 2020-07-01 DIAGNOSIS — H3581 Retinal edema: Secondary | ICD-10-CM

## 2020-07-01 DIAGNOSIS — E119 Type 2 diabetes mellitus without complications: Secondary | ICD-10-CM | POA: Diagnosis not present

## 2020-07-01 DIAGNOSIS — H10413 Chronic giant papillary conjunctivitis, bilateral: Secondary | ICD-10-CM | POA: Diagnosis not present

## 2020-07-01 DIAGNOSIS — H35033 Hypertensive retinopathy, bilateral: Secondary | ICD-10-CM | POA: Diagnosis not present

## 2020-07-01 DIAGNOSIS — H43811 Vitreous degeneration, right eye: Secondary | ICD-10-CM | POA: Diagnosis not present

## 2020-07-01 DIAGNOSIS — H3561 Retinal hemorrhage, right eye: Secondary | ICD-10-CM | POA: Diagnosis not present

## 2020-07-01 DIAGNOSIS — H2513 Age-related nuclear cataract, bilateral: Secondary | ICD-10-CM | POA: Diagnosis not present

## 2020-07-01 DIAGNOSIS — I10 Essential (primary) hypertension: Secondary | ICD-10-CM

## 2020-07-01 DIAGNOSIS — H25813 Combined forms of age-related cataract, bilateral: Secondary | ICD-10-CM | POA: Diagnosis not present

## 2020-07-01 NOTE — Progress Notes (Signed)
[facility] - Clinic Note  07/01/2020     CHIEF COMPLAINT Patient presents for Retina Evaluation   HISTORY OF PRESENT ILLNESS: Alex Meyers is a 79 y.o. male who presents to the clinic today for:   HPI    Retina Evaluation    In right eye.  This started 2 days ago.  Duration of 2 days.  Associated Symptoms Floaters.  Context:  distance vision, mid-range vision and near vision.  Treatments tried include eye drops.  Response to treatment was no improvement.          Comments    79 y/o male pt referred today by Alma Downs, PA for eval of line retinal hemorrhage.  Pt had sudden onset floater OD x 2 days.  No obvious trigger, and nothing like this has ever occurred before.  No change in [place] OU.  Denies pain, FOL.  No problems OS.  [place] prn OU.  BS unknown.  A1C 7.1.       Last edited by Celine Mans, COA on 07/01/2020  3:08 PM. (History)    pt is here on the referral of Alma Downs, PA-C for concern of vitreous hemorrhage, pt states he is a regular pt of theirs, he states he about 3 days ago, he started to see a couple floaters "with strings attached", pt denies fol or shadows in his vision    Referring physician: Alma Downs, PA   HISTORICAL INFORMATION:   Selected notes from the MEDICAL RECORD NUMBER Referred by Alma Downs, PA for eval of linear retinal hemorrhage.   CURRENT MEDICATIONS: No current outpatient medications on file. (Ophthalmic Drugs)   No current facility-administered medications for this visit. (Ophthalmic Drugs)   Current Outpatient Medications (Other)  Medication Sig  . clopidogrel (PLAVIX) 75 MG tablet Take 1 tablet (75 mg total) by mouth daily.  . cyanocobalamin 1000 MCG tablet Take 2 tablets once a day  . Dapsone 5 % topical gel   . empagliflozin (JARDIANCE) 10 MG TABS tablet Take by mouth.  . Glucosamine HCl 1000 MG TABS Take 1,000 mg by mouth daily.  . meloxicam (MOBIC) 15 MG tablet Take 1 tablet  (15 mg total) by mouth daily.  . metFORMIN (GLUCOPHAGE-XR) 500 MG 24 hr tablet Take 1 tablet by mouth at bedtime.  . NEOMYCIN-POLYMYXIN-HYDROCORTISONE (CORTISPORIN) 1 % SOLN OTIC solution Apply 1-2 drops to toe BID after soaking  . nitroGLYCERIN (NITRODUR - DOSED IN MG/24 HR) 0.2 mg/hr patch UNWRAP AND APPLY 1/4 PATCH TO THE AFFECTED AREA DAILY  . pioglitazone (ACTOS) 45 MG tablet Take 45 mg by mouth daily.  . rosuvastatin (CRESTOR) 20 MG tablet Take 1 tablet (20 mg total) by mouth daily.  . sildenafil (VIAGRA) 50 MG tablet Take 1 tablet as directed as needed  . sitaGLIPtin (JANUVIA) 100 MG tablet Take 1 tablet by mouth once a day for diabetes   No current facility-administered medications for this visit. (Other)      REVIEW OF SYSTEMS: ROS    Positive for: Musculoskeletal, Endocrine, Cardiovascular, Eyes   Negative for: Constitutional, Gastrointestinal, Neurological, Skin, Genitourinary, HENT, Respiratory, Psychiatric, Allergic/Imm, Heme/Lymph   Last edited by Celine Mans, COA on 07/01/2020  2:59 PM. (History)       ALLERGIES No Known Allergies  PAST MEDICAL HISTORY Past Medical History:  Diagnosis Date  . Coronary artery disease    s//p DES LAD and Dx1 01/2009  . Diabetes mellitus   . Diverticulosis   . Hx  of colonic polyps 08/2010   Dr. Hilarie Fredrickson  . Hyperlipidemia    pt denies  . Hypertension    pt. denies  . Internal hemorrhoids with Grade 2-3 prolapse and bleeding 09/25/2012   Diagnosed by Dr. Linda Hedges via anoscopy   . Multiple thyroid nodules   . Personal history of colonic polyps 08/09/2002   ADENOMATOUS POLYP  . Tubular adenoma of colon    Past Surgical History:  Procedure Laterality Date  . COLONOSCOPY  multiple  . CORONARY STENT PLACEMENT  01/22/2009   right side  . WRIST SURGERY     right side    FAMILY HISTORY Family History  Problem Relation Age of Onset  . Heart failure Mother   . Coronary artery disease Mother   . Bladder Cancer Father 43  .  Breast cancer Daughter   . Colon cancer Neg Hx     SOCIAL HISTORY Social History   Tobacco Use  . Smoking status: Never Smoker  . Smokeless tobacco: Never Used  Vaping Use  . Vaping Use: Never used  Substance Use Topics  . Alcohol use: Yes    Alcohol/week: 4.0 standard drinks    Types: 4 Cans of beer per week    Comment: weekly  . Drug use: No         OPHTHALMIC EXAM:  Base Eye Exam    Visual Acuity (Snellen - Linear)      Right Left   Dist cc 20/25 -2 20/20 -2   Dist ph cc 20/20    Correction: Glasses       Tonometry (Tonopen, 3:09 PM)      Right Left   Pressure 16 14       Pupils      Dark Light Shape React APD   Right 3 2 Round Brisk None   Left 3 2 Round Brisk None       Visual Fields (Counting fingers)      Left Right    Full Full       Extraocular Movement      Right Left    Full, Ortho Full, Ortho       Neuro/Psych    Oriented x3: Yes   Mood/Affect: Normal       Dilation    Both eyes: 1.0% Mydriacyl, 2.5% Phenylephrine @ 3:09 PM        Slit Lamp and Fundus Exam    Slit Lamp Exam      Right Left   Lids/Lashes Dermatochalasis - upper lid Dermatochalasis - upper lid   Conjunctiva/Sclera White and quiet White and quiet   Cornea arcus, trace Punctate epithelial erosions arcus, trace Punctate epithelial erosions   Anterior Chamber Deep and quiet Deep and quiet   Iris Round and dilated Round and dilated   Lens 2-3+ Nuclear sclerosis, 2-3+ Cortical cataract 2-3+ Nuclear sclerosis, 2-3+ Cortical cataract   Vitreous Vitreous syneresis, Posterior vitreous detachment, mild vitreous condensations Vitreous syneresis       Fundus Exam      Right Left   Disc Pink and Sharp Pink and Sharp, Compact   C/D Ratio 0.2 0.3   Macula Flat, Blunted foveal reflex, RPE mottling and clumping, No heme or edema Flat, good foveal reflex, RPE mottling and clumping, No heme or edema   Vessels attenuated, mild tortuousity attenuated, mild tortuousity   Periphery  Attached, rare peripheral drusen, linear pre-retinal heme at 0630, No RT/RD on 360 scleral depression  Attached, rare peripheral drusen  Refraction    Wearing Rx      Sphere Cylinder Axis Add   Right -2.75 +3.25 004 +2.75   Left -2.50 +2.75 175 +2.75   Age: 60yrs   Type: PAL       Manifest Refraction      Sphere Cylinder Axis Dist VA   Right -3.00 +3.00 005 20/20-2   Left -2.50 +3.00 175 20/20-          IMAGING AND PROCEDURES  Imaging and Procedures for 07/01/2020  OCT, Retina - OU - Both Eyes       Right Eye Quality was good. Central Foveal Thickness: 287. Progression has no prior data. Findings include no IRF, normal foveal contour, no SRF (Mild vitreous opacities).   Left Eye Central Foveal Thickness: 280. Progression has no prior data. Findings include normal foveal contour, no IRF, no SRF (Partial PVD).   Notes *Images captured and stored on drive  Diagnosis / Impression:  NFP, no IRF/SRF OU OD: mild vitreous opacities  OS: partial PVD  Clinical management:  See below  Abbreviations: NFP - Normal foveal profile. CME - cystoid macular edema. PED - pigment epithelial detachment. IRF - intraretinal fluid. SRF - subretinal fluid. EZ - ellipsoid zone. ERM - epiretinal membrane. ORA - outer retinal atrophy. ORT - outer retinal tubulation. SRHM - subretinal hyper-reflective material. IRHM - intraretinal hyper-reflective material                ASSESSMENT/PLAN:    ICD-10-CM   1. Posterior vitreous detachment of right eye  H43.811   2. Vitreous hemorrhage of right eye ([place])  H43.11   3. Retinal edema  H35.81 OCT, Retina - OU - Both Eyes  4. Diabetes mellitus type 2 without retinopathy ([place])  E11.9   5. Essential hypertension  I10   6. Hypertensive retinopathy of both eyes  H35.033   7. Combined forms of age-related cataract of both eyes  H25.813     1,2. Hemorrhagic PVD OD  - Onset ~2 days ago -- presented to [facility] today,  12.29.21  - symptomatic floaters, no photopsias OD  - Discussed findings and prognosis  - No RT or RD on 360 scleral depressed exam  - Reviewed s/s of RT/RD  - Strict return precautions for any such RT/RD signs/symptoms  - VH precautions reviewed -- minimize activities, keep head elevated, avoid ASA/NSAIDs/blood thinners as able  - f/u 3-4 weeks, sooner prn -- DFE, OCT  3. No retinal edema on exam or OCT  4. Diabetes mellitus, type 2 without retinopathy - The incidence, risk factors for progression, natural history and treatment options for diabetic retinopathy  were discussed with patient.   - The need for close monitoring of blood glucose, blood pressure, and serum lipids, avoiding cigarette or any type of tobacco, and the need for long term follow up was also discussed with patient. - f/u in 1 year, sooner prn  5,6. Hypertensive retinopathy OU - discussed importance of tight BP control - monitor  7. Mixed Cataract OU - The symptoms of cataract, surgical options, and treatments and risks were discussed with patient. - discussed diagnosis and progression  Ophthalmic Meds Ordered this visit:  No orders of the defined types were placed in this encounter.      Return for f/u 3-4 weeks, hemorrhagic PVD OD, DFE, OCT.  There are no Patient Instructions on file for this visit.  Explained the diagnoses, plan, and follow up with the patient and they expressed understanding.  Patient expressed understanding of the importance of proper follow up care.   This document serves as a record of services personally performed by Gardiner Sleeper, MD, PhD. It was created on their behalf by Estill Bakes, COT an ophthalmic technician. The creation of this record is the provider's dictation and/or activities during the visit.    Electronically signed by: Estill Bakes, COT 07/01/20 @ 5:07 PM   This document serves as a record of services personally performed by Gardiner Sleeper, MD, PhD. It was  created on their behalf by San Jetty. Owens Shark, OA an ophthalmic technician. The creation of this record is the provider's dictation and/or activities during the visit.    Electronically signed by: San Jetty. Owens Shark, [place] 12.29.2021 5:07 PM   Gardiner Sleeper, M.D., Ph.D. Diseases & Surgery of the Retina and Vitreous [facility]  I have reviewed the above documentation for accuracy and completeness, and I agree with the above. Gardiner Sleeper, M.D., Ph.D. 07/01/20 5:07 PM   Abbreviations: M myopia (nearsighted); A astigmatism; H hyperopia (farsighted); P presbyopia; Mrx spectacle prescription;  CTL contact lenses; OD right eye; OS left eye; OU both eyes  XT exotropia; ET esotropia; PEK punctate epithelial keratitis; PEE punctate epithelial erosions; DES dry eye syndrome; MGD meibomian gland dysfunction; ATs artificial tears; PFAT's preservative free artificial tears; [place] nuclear sclerotic cataract; PSC posterior subcapsular cataract; ERM epi-retinal membrane; PVD posterior vitreous detachment; RD retinal detachment; DM diabetes mellitus; DR diabetic retinopathy; NPDR non-proliferative diabetic retinopathy; PDR proliferative diabetic retinopathy; CSME clinically significant macular edema; DME diabetic macular edema; dbh dot blot hemorrhages; CWS cotton wool spot; POAG primary open angle glaucoma; C/D cup-to-disc ratio; HVF humphrey visual field; GVF goldmann visual field; OCT optical coherence tomography; IOP intraocular pressure; BRVO Branch retinal vein occlusion; CRVO central retinal vein occlusion; CRAO central retinal artery occlusion; BRAO branch retinal artery occlusion; RT retinal tear; SB scleral buckle; PPV pars plana vitrectomy; VH Vitreous hemorrhage; PRP panretinal laser photocoagulation; IVK intravitreal kenalog; VMT vitreomacular traction; MH Macular hole;  NVD neovascularization of the disc; NVE neovascularization elsewhere; AREDS age related eye disease study; ARMD age  related macular degeneration; POAG primary open angle glaucoma; EBMD epithelial/anterior basement membrane dystrophy; ACIOL anterior chamber intraocular lens; IOL intraocular lens; PCIOL posterior chamber intraocular lens; Phaco/IOL phacoemulsification with intraocular lens placement; [place] photorefractive keratectomy; LASIK laser assisted in situ keratomileusis; HTN hypertension; DM diabetes mellitus; COPD chronic obstructive pulmonary disease

## 2020-07-05 NOTE — Progress Notes (Deleted)
Cardiology Office Note:    Date:  07/05/2020   ID:  Alex Meyers, DOB [birth year], MRN [id number]  PCP:  Kristian Covey, MD  [facility] Cardiologist:  Tonny Bollman, MD  [facility] Electrophysiologist:  None   Referring MD: Kristian Covey, MD   No chief complaint on file. ***  History of Present Illness:    Alex Meyers is a 80 y.o. male with a hx of CAD, presenting for follow-up evaluation. He was last evaluated by Tereso Newcomer in 2019 for routine outpatient follow-up. He is followed for CAD with hx of DES in the LAD and diagonal in 2010 without recurrent angina. The patient's comorbid conditions include diabetes and mixed hyperlipidemia. At the time of his last exercise stress test in 2019t, he exercised 10 minutes 55 seconds and achieved 86% maximum predicted heart rate without any evidence of exercise induced ischemia.   Past Medical History:  Diagnosis Date  . Coronary artery disease    s//p DES LAD and Dx1 01/2009  . Diabetes mellitus   . Diverticulosis   . Hx of colonic polyps 08/2010   Dr. Rhea Belton  . Hyperlipidemia    pt denies  . Hypertension    pt. denies  . Internal hemorrhoids with Grade 2-3 prolapse and bleeding 09/25/2012   Diagnosed by Dr. Debby Bud via anoscopy   . Multiple thyroid nodules   . Personal history of colonic polyps 08/09/2002   ADENOMATOUS POLYP  . Tubular adenoma of colon     Past Surgical History:  Procedure Laterality Date  . COLONOSCOPY  multiple  . CORONARY STENT PLACEMENT  01/22/2009   right side  . WRIST SURGERY     right side    Current Medications: No outpatient medications have been marked as taking for the 07/06/20 encounter (Appointment) with Tonny Bollman, MD.     Allergies:   Patient has no known allergies.   Social History   Socioeconomic History  . Marital status: Divorced    Spouse name: Not on file  . Number of children: 3  . Years of education: Not on file  . Highest education level: Not on file   Occupational History  . Occupation: LAWYER    Employer: [employer]  Tobacco Use  . Smoking status: Never Smoker  . Smokeless tobacco: Never Used  Vaping Use  . Vaping Use: Never used  Substance and Sexual Activity  . Alcohol use: Yes    Alcohol/week: 4.0 standard drinks    Types: 4 Cans of beer per week    Comment: weekly  . Drug use: No  . Sexual activity: Not Currently  Other Topics Concern  . Not on file  Social History Narrative   Nelda Severe, Law school - [employer] . Married '78 the patient is divorced after 22 years of marriage. Has 3 daughters. He is an active attorney, formerly with [employer] but he has recently ('12) gone out on his own. Serially monogamous, does not always practice safe sex.    Social Determinants of Health   Financial Resource Strain: Not on file  Food Insecurity: Not on file  Transportation Needs: Not on file  Physical Activity: Not on file  Stress: Not on file  Social Connections: Not on file     Family History: The patient's family history includes Bladder Cancer (age of onset: 66) in his father; Breast cancer in his daughter; Coronary artery disease in his mother; Heart failure in his mother. There is no history of  Colon cancer.  ROS:   Please see the history of present illness.    All other systems reviewed and are negative.  EKGs/Labs/Other Studies Reviewed:    The following studies were reviewed today: GXT 05/14/18: Study Highlights    Blood pressure demonstrated a normal response to exercise.  There was no ST segment deviation noted during stress.  No T wave inversion was noted during stress.   Normal ECG stress test.  EKG:  EKG is *** ordered today.  The ekg ordered today demonstrates ***  Recent Labs: 11/27/2019: ALT 13; BUN 20; Creatinine 0.9; Potassium 4.5; Sodium 141  Recent Lipid Panel    Component Value Date/Time   CHOL 134 11/27/2019 0000   CHOL 132 06/24/2017 0000   TRIG 77 11/27/2019 0000    HDL 66 11/27/2019 0000   HDL 66 06/24/2017 0000   CHOLHDL 2 05/24/2016 1055   VLDL 12.6 05/24/2016 1055   LDLCALC 54 11/27/2019 0000     Risk Assessment/Calculations:       Physical Exam:    VS:  There were no vitals taken for this visit.    Wt Readings from Last 3 Encounters:  04/07/20 161 lb 6 oz (73.2 kg)  01/02/20 161 lb (73 kg)  11/19/19 161 lb (73 kg)     GEN: *** Well nourished, well developed in no acute distress HEENT: Normal NECK: No JVD; No carotid bruits LYMPHATICS: No lymphadenopathy CARDIAC: ***RRR, no murmurs, rubs, gallops RESPIRATORY:  Clear to auscultation without rales, wheezing or rhonchi  ABDOMEN: Soft, non-tender, non-distended MUSCULOSKELETAL:  No edema; No deformity  SKIN: Warm and dry NEUROLOGIC:  Alert and oriented x 3 PSYCHIATRIC:  Normal affect   ASSESSMENT:    1. Coronary artery disease involving native coronary artery of native heart without angina pectoris   2. Mixed hyperlipidemia    PLAN:    In order of problems listed above:  1. Remains clinically stable without angina.  2. Lipids at goal. Last LDL cholesterol 54, HDL 66, Trig 77. Maintains excellent diet/lifestyle.    {Are you ordering a CV Procedure (e.g. stress test, cath, DCCV, TEE, etc)?   Press F2        :[id number]    Medication Adjustments/Labs and Tests Ordered: Current medicines are reviewed at length with the patient today.  Concerns regarding medicines are outlined above.  No orders of the defined types were placed in this encounter.  No orders of the defined types were placed in this encounter.   There are no Patient Instructions on file for this visit.   Signed, Sherren Mocha, MD  07/05/2020 2:20 PM    [place]

## 2020-07-06 ENCOUNTER — Ambulatory Visit: Payer: Medicare HMO | Admitting: Cardiovascular Disease

## 2020-07-06 DIAGNOSIS — I2510 Atherosclerotic heart disease of native coronary artery without angina pectoris: Secondary | ICD-10-CM

## 2020-07-06 DIAGNOSIS — E782 Mixed hyperlipidemia: Secondary | ICD-10-CM

## 2020-07-07 ENCOUNTER — Telehealth: Payer: Self-pay | Admitting: Cardiovascular Disease

## 2020-07-07 NOTE — Telephone Encounter (Signed)
Attempted to call patient to tell him he will be placed on waiting list.  Mailbox is full - unable to leave message. Will try again when appointment is available.

## 2020-07-07 NOTE — Telephone Encounter (Signed)
Patient called to apologize for missing yesterdays appt with Dr. Excell Seltzer due to the weather. He states he would like another chance to be put on his schedule. I do not see anything open with Dr. Excell Seltzer and expressed this to the patient. He is wondering if there is anyway he can be put on his schedule one more time..please advise.

## 2020-07-10 NOTE — Telephone Encounter (Signed)
Scheduled the patient 07/13/20 for visit with Dr. Burt Knack. The patient was grateful for call and agrees with plan.

## 2020-07-13 ENCOUNTER — Other Ambulatory Visit: Payer: Self-pay

## 2020-07-13 ENCOUNTER — Ambulatory Visit: Payer: Medicare HMO | Admitting: Cardiovascular Disease

## 2020-07-13 ENCOUNTER — Encounter: Payer: Self-pay | Admitting: Cardiovascular Disease

## 2020-07-13 VITALS — BP 110/62 | HR 59 | Ht 71.5 in | Wt 164.2 lb

## 2020-07-13 DIAGNOSIS — E782 Mixed hyperlipidemia: Secondary | ICD-10-CM

## 2020-07-13 DIAGNOSIS — E119 Type 2 diabetes mellitus without complications: Secondary | ICD-10-CM | POA: Diagnosis not present

## 2020-07-13 DIAGNOSIS — I2510 Atherosclerotic heart disease of native coronary artery without angina pectoris: Secondary | ICD-10-CM

## 2020-07-13 NOTE — Patient Instructions (Signed)
Medication Instructions:  Your provider recommends that you continue on your current medications as directed. Please refer to the Current Medication list given to you today.   *If you need a refill on your cardiac medications before your next appointment, please call your pharmacy*  Testing/Procedures: Your provider has requested that you have an exercise tolerance test in 2 months. For further information please visit [email or web address]. Please also follow instruction sheet, as given.  Follow-Up: At [facility], you and your health needs are our priority.  As part of our continuing mission to provide you with exceptional heart care, we have created designated Provider Care Teams.  These Care Teams include your primary Cardiologist (physician) and Advanced Practice Providers (APPs -  Physician Assistants and Nurse Practitioners) who all work together to provide you with the care you need, when you need it. Your next appointment:   12 month(s) The format for your next appointment:   In Person Provider:   You may see Sherren Mocha, MD or one of the following Advanced Practice Providers on your designated Care Team:    Richardson Dopp, PA-C  Vin Thayer, [place]

## 2020-07-13 NOTE — Progress Notes (Signed)
Cardiology Office Note:    Date:  07/14/2020   ID:  Alex Meyers, DOB 1941/04/21, MRN [id number]  PCP:  Eulas Post, MD  [facility] Cardiologist:  Sherren Mocha, MD  [place] Electrophysiologist:  None   Referring MD: Eulas Post, MD   Chief Complaint  Patient presents with  . Coronary Artery Disease    History of Present Illness:    Alex Meyers is a 80 y.o. male with a hx of coronary artery disease, presenting for follow-up evaluation.  He was last seen here by Richardson Dopp in 2019 for regular follow-up.  The patient has a history of LAD and diagonal branch stenting in 2010.  Comorbid conditions include diabetes and hyperlipidemia.  He is here alone today. Still jogging - tries to get in 15 miles/week. He had exertional angina prior to undergoing PCI many years ago and this has not recurred. Today, he denies symptoms of palpitations, chest pain, shortness of breath, orthopnea, PND, lower extremity edema, dizziness, or syncope. He is now taking jardiance, actos, metformin, and januvia for diabetes. His last A1C on file from May was 6.8 but he states his recent A1C was > 7.     Past Medical History:  Diagnosis Date  . Coronary artery disease    s//p DES LAD and Dx1 01/2009  . Diabetes mellitus   . Diverticulosis   . Hx of colonic polyps 08/2010   Dr. Hilarie Fredrickson  . Hyperlipidemia    pt denies  . Hypertension    pt. denies  . Internal hemorrhoids with Grade 2-3 prolapse and bleeding 09/25/2012   Diagnosed by Dr. Linda Hedges via anoscopy   . Multiple thyroid nodules   . Personal history of colonic polyps 08/09/2002   ADENOMATOUS POLYP  . Tubular adenoma of colon     Past Surgical History:  Procedure Laterality Date  . COLONOSCOPY  multiple  . CORONARY STENT PLACEMENT  01/22/2009   right side  . WRIST SURGERY     right side    Current Medications: Current Meds  Medication Sig  . clopidogrel (PLAVIX) 75 MG tablet Take 1 tablet (75 mg total) by mouth  daily.  . cyanocobalamin 1000 MCG tablet Take 2 tablets once a day  . Dapsone 5 % topical gel   . empagliflozin (JARDIANCE) 10 MG TABS tablet Take by mouth.  . Glucosamine HCl 1000 MG TABS Take 1,000 mg by mouth daily.  . meloxicam (MOBIC) 15 MG tablet Take 1 tablet (15 mg total) by mouth daily.  . metFORMIN (GLUCOPHAGE-XR) 500 MG 24 hr tablet Take 1 tablet by mouth at bedtime.  . nitroGLYCERIN (NITRODUR - DOSED IN MG/24 HR) 0.2 mg/hr patch UNWRAP AND APPLY 1/4 PATCH TO THE AFFECTED AREA DAILY  . pioglitazone (ACTOS) 45 MG tablet Take 45 mg by mouth daily.  . rosuvastatin (CRESTOR) 20 MG tablet Take 1 tablet (20 mg total) by mouth daily.  . sildenafil (VIAGRA) 50 MG tablet Take 1 tablet as directed as needed  . sitaGLIPtin (JANUVIA) 100 MG tablet Take 1 tablet by mouth once a day for diabetes     Allergies:   Patient has no known allergies.   Social History   Socioeconomic History  . Marital status: Divorced    Spouse name: Not on file  . Number of children: 3  . Years of education: Not on file  . Highest education level: Not on file  Occupational History  . Occupation: LAWYER    Employer: [employer]  Tobacco Use  . Smoking status: Never Smoker  . Smokeless tobacco: Never Used  Vaping Use  . Vaping Use: Never used  Substance and Sexual Activity  . Alcohol use: Yes    Alcohol/week: 4.0 standard drinks    Types: 4 Cans of beer per week    Comment: weekly  . Drug use: No  . Sexual activity: Not Currently  Other Topics Concern  . Not on file  Social History Narrative   [name], Law school - [employer] . Married '78 the patient is divorced after 76 years of marriage. Has 3 daughters. He is an active attorney, formerly with [employer] but he has recently ('12) gone out on his own. Serially monogamous, does not always practice safe sex.    Social Determinants of Health   Financial Resource Strain: Not on file  Food Insecurity: Not on file  Transportation  Needs: Not on file  Physical Activity: Not on file  Stress: Not on file  Social Connections: Not on file     Family History: The patient's family history includes Bladder Cancer (age of onset: 38) in his father; Breast cancer in his daughter; Coronary artery disease in his mother; Heart failure in his mother. There is no history of Colon cancer.  ROS:   Please see the history of present illness.    All other systems reviewed and are negative.  EKGs/Labs/Other Studies Reviewed:    The following studies were reviewed today: GXT 05-18-2018: Study Highlights    Blood pressure demonstrated a normal response to exercise.  There was no ST segment deviation noted during stress.  No T wave inversion was noted during stress.   Normal ECG stress test.  Stress Measurements  Baseline Vitals  Rest HR 57 bpm    Rest BP 123/72 mmHg    Exercise Time  Exercise duration (min) 10 min    Exercise duration (sec) 55 sec    Peak Stress Vitals  Peak HR 125 bpm    Peak BP 185/69 mmHg    Exercise Data  MPHR 145 bpm    Percent HR 86 %    RPE 15     Estimated workload 13.2 METS       EKG:  EKG is ordered today.  The ekg ordered today demonstrates NSR 59 bpm, age-indeterminate inferior infarct, age-indeterminate anteroseptal infarct, no change   Recent Labs: 11/27/2019: ALT 13; BUN 20; Creatinine 0.9; Potassium 4.5; Sodium 141  Recent Lipid Panel    Component Value Date/Time   CHOL 134 11/27/2019 0000   CHOL 132 06/24/2017 0000   TRIG 77 11/27/2019 0000   HDL 66 11/27/2019 0000   HDL 66 06/24/2017 0000   CHOLHDL 2 05/24/2016 1055   VLDL 12.6 05/24/2016 1055   LDLCALC 54 11/27/2019 0000     Risk Assessment/Calculations:       Physical Exam:    VS:  BP 110/62   Pulse (!) 59   Ht 5' 11.5" (1.816 m)   Wt 164 lb 3.2 oz (74.5 kg)   SpO2 97%   BMI 22.58 kg/m     Wt Readings from Last 3 Encounters:  07/13/20 164 lb 3.2 oz (74.5 kg)  04/07/20 161 lb 6 oz (73.2 kg)   01/02/20 161 lb (73 kg)     GEN:  Well nourished, well developed in no acute distress HEENT: Normal NECK: No JVD; No carotid bruits LYMPHATICS: No lymphadenopathy CARDIAC: RRR, no murmurs, rubs, gallops RESPIRATORY:  Clear to auscultation without rales,  wheezing or rhonchi  ABDOMEN: Soft, non-tender, non-distended MUSCULOSKELETAL:  No edema; No deformity  SKIN: Warm and dry NEUROLOGIC:  Alert and oriented x 3 PSYCHIATRIC:  Normal affect   ASSESSMENT:    1. Coronary artery disease involving native coronary artery of native heart without angina pectoris   2. Mixed hyperlipidemia   3. Type 2 diabetes mellitus without complication, without long-term current use of insulin (HCC)    PLAN:    In order of problems listed above:  1. Stable without angina. Continue clopidogrel for antiplatelet therapy and a high-intensity statin. Check exercise treadmill study (non-imaging). 2. Treated with rosuvastatin 20 mg daily. Most recent lipids reviewed with LDL cholesterol at goal.  3. Notes increasing need for oral hypoglycemics. Pt has good understanding of diabetes management and is very motivated. Follows good lifestyle/diet/regular exercise.   Shared Decision Making/Informed Consent The risks [chest pain, shortness of breath, cardiac arrhythmias, dizziness, blood pressure fluctuations, myocardial infarction, stroke/transient ischemic attack, and life-threatening complications (estimated to be 1 in 10,000)], benefits (risk stratification, diagnosing coronary artery disease, treatment guidance) and alternatives of an exercise tolerance test were discussed in detail with Alex Meyers and he agrees to proceed.      Medication Adjustments/Labs and Tests Ordered: Current medicines are reviewed at length with the patient today.  Concerns regarding medicines are outlined above.  Orders Placed This Encounter  Procedures  . EXERCISE TOLERANCE TEST (ETT)  . EKG 12-Lead   No orders of the defined  types were placed in this encounter.   Patient Instructions  Medication Instructions:  Your provider recommends that you continue on your current medications as directed. Please refer to the Current Medication list given to you today.   *If you need a refill on your cardiac medications before your next appointment, please call your pharmacy*  Testing/Procedures: Your provider has requested that you have an exercise tolerance test in 2 months. For further information please visit [email or web address]. Please also follow instruction sheet, as given.  Follow-Up: At [facility], you and your health needs are our priority.  As part of our continuing mission to provide you with exceptional heart care, we have created designated Provider Care Teams.  These Care Teams include your primary Cardiologist (physician) and Advanced Practice Providers (APPs -  Physician Assistants and Nurse Practitioners) who all work together to provide you with the care you need, when you need it. Your next appointment:   12 month(s) The format for your next appointment:   In Person Provider:   You may see Sherren Mocha, MD or one of the following Advanced Practice Providers on your designated Care Team:    Richardson Dopp, PA-C  Robbie Lis, [place]      Signed, Sherren Mocha, MD  07/14/2020 6:53 PM    [place]

## 2020-07-15 ENCOUNTER — Ambulatory Visit: Payer: Medicare HMO

## 2020-07-21 ENCOUNTER — Other Ambulatory Visit: Payer: Self-pay | Admitting: Cardiovascular Disease

## 2020-07-28 NOTE — Progress Notes (Signed)
[facility] Note  07/29/2020     CHIEF COMPLAINT Patient presents for Retina Follow Up   HISTORY OF PRESENT ILLNESS: Alex Meyers is a 80 y.o. male who presents to the clinic today for:   HPI    Retina Follow Up    Patient presents with  PVD.  In right eye.  This started 4 weeks ago.  I, the attending physician,  performed the HPI with the patient and updated documentation appropriately.          Comments    Patient here for 4 weeks retina follow up for PVD OD. Patient states vision aware of that little spot. It moves around some. Still there. Upper right hand corner of OD. No eye pain.       Last edited by Bernarda Caffey, MD on 07/29/2020  1:57 PM. (History)    pt states his floater is still there, but it is moving around    Referring physician: Shirleen Schirmer, PA   HISTORICAL INFORMATION:   Selected notes from the [place] Referred by Shirleen Schirmer, PA for eval of linear retinal hemorrhage.   CURRENT MEDICATIONS: No current outpatient medications on file. (Ophthalmic Drugs)   No current facility-administered medications for this visit. (Ophthalmic Drugs)   Current Outpatient Medications (Other)  Medication Sig  . clopidogrel (PLAVIX) 75 MG tablet TAKE 1 TABLET(75 MG) BY MOUTH DAILY  . cyanocobalamin 1000 MCG tablet Take 2 tablets once a day  . Dapsone 5 % topical gel   . empagliflozin (JARDIANCE) 10 MG TABS tablet Take by mouth.  . Glucosamine HCl 1000 MG TABS Take 1,000 mg by mouth daily.  . meloxicam (MOBIC) 15 MG tablet Take 1 tablet (15 mg total) by mouth daily.  . metFORMIN (GLUCOPHAGE-XR) 500 MG 24 hr tablet Take 1 tablet by mouth at bedtime.  . nitroGLYCERIN (NITRODUR - DOSED IN MG/24 HR) 0.2 mg/hr patch UNWRAP AND APPLY 1/4 PATCH TO THE AFFECTED AREA DAILY  . pioglitazone (ACTOS) 45 MG tablet Take 45 mg by mouth daily.  . rosuvastatin (CRESTOR) 20 MG tablet Take 1 tablet (20 mg total) by mouth daily.  .  sildenafil (VIAGRA) 50 MG tablet Take 1 tablet as directed as needed  . sitaGLIPtin (JANUVIA) 100 MG tablet Take 1 tablet by mouth once a day for diabetes   No current facility-administered medications for this visit. (Other)      REVIEW OF SYSTEMS: ROS    Positive for: Musculoskeletal, Endocrine, Cardiovascular, Eyes   Negative for: Constitutional, Gastrointestinal, Neurological, Skin, Genitourinary, HENT, Respiratory, Psychiatric, Allergic/Imm, Heme/Lymph   Last edited by Theodore Demark, COA on 07/29/2020  1:36 PM. (History)       ALLERGIES No Known Allergies  PAST MEDICAL HISTORY Past Medical History:  Diagnosis Date  . Coronary artery disease    s//p DES LAD and Dx1 01/2009  . Diabetes mellitus   . Diverticulosis   . Hx of colonic polyps 08/2010   Dr. Hilarie Fredrickson  . Hyperlipidemia    pt denies  . Hypertension    pt. denies  . Internal hemorrhoids with Grade 2-3 prolapse and bleeding 09/25/2012   Diagnosed by Dr. Linda Hedges via anoscopy   . Multiple thyroid nodules   . Personal history of colonic polyps 08/09/2002   ADENOMATOUS POLYP  . Tubular adenoma of colon    Past Surgical History:  Procedure Laterality Date  . COLONOSCOPY  multiple  . CORONARY STENT PLACEMENT  01/22/2009   right  side  . WRIST SURGERY     right side    FAMILY HISTORY Family History  Problem Relation Age of Onset  . Heart failure Mother   . Coronary artery disease Mother   . Bladder Cancer Father 33  . Breast cancer Daughter   . Colon cancer Neg Hx     SOCIAL HISTORY Social History   Tobacco Use  . Smoking status: Never Smoker  . Smokeless tobacco: Never Used  Vaping Use  . Vaping Use: Never used  Substance Use Topics  . Alcohol use: Yes    Alcohol/week: 4.0 standard drinks    Types: 4 Cans of beer per week    Comment: weekly  . Drug use: No         OPHTHALMIC EXAM:  Base Eye Exam    Visual Acuity (Snellen - Linear)      Right Left   Dist cc 20/25 -1 20/20 -2   Dist ph cc  20/20 -1    Correction: Glasses       Tonometry (Tonopen, 1:32 PM)      Right Left   Pressure 15 15       Pupils      Dark Light Shape React APD   Right 2 1 Round Brisk None   Left 2 1 Round Brisk None       Visual Fields (Counting fingers)      Left Right    Full Full       Extraocular Movement      Right Left    Full Full       Neuro/Psych    Oriented x3: Yes   Mood/Affect: Normal       Dilation    Both eyes: 1.0% Mydriacyl, 2.5% Phenylephrine @ 1:32 PM        Slit Lamp and Fundus Exam    Slit Lamp Exam      Right Left   Lids/Lashes Dermatochalasis - upper lid Dermatochalasis - upper lid   Conjunctiva/Sclera White and quiet White and quiet   Cornea arcus, trace Punctate epithelial erosions arcus, trace Punctate epithelial erosions   Anterior Chamber Deep and quiet Deep and quiet   Iris Round and dilated Round and dilated   Lens 2-3+ Nuclear sclerosis, 2-3+ Cortical cataract 2-3+ Nuclear sclerosis, 2-3+ Cortical cataract   Vitreous Vitreous syneresis, Posterior vitreous detachment, mild vitreous condensations Vitreous syneresis       Fundus Exam      Right Left   Disc Pink and Sharp Pink and Sharp, Compact   C/D Ratio 0.2 0.3   Macula Flat, Blunted foveal reflex, RPE mottling and clumping, No heme or edema Flat, good foveal reflex, RPE mottling and clumping, No heme or edema   Vessels attenuated, mild tortuousity attenuated, mild tortuousity   Periphery Attached, rare peripheral drusen, No RT/RD on 360 exam Attached, rare peripheral drusen        Refraction    Wearing Rx      Sphere Cylinder Axis Add   Right -2.75 +3.25 004 +2.75   Left -2.50 +2.75 175 +2.75   Type: PAL          IMAGING AND PROCEDURES  Imaging and Procedures for 07/29/2020  OCT, Retina - OU - Both Eyes       Right Eye Quality was good. Central Foveal Thickness: 292. Progression has been stable. Findings include no IRF, normal foveal contour, no SRF (Interval improvement in  vitreous opacities).   Left Eye Central  Foveal Thickness: 286. Progression has been stable. Findings include normal foveal contour, no IRF, no SRF (Partial PVD).   Notes *Images captured and stored on drive  Diagnosis / Impression:  NFP, no IRF/SRF OU OD: Interval improvement in vitreous opacities OS: partial PVD  Clinical management:  See below  Abbreviations: NFP - Normal foveal profile. CME - cystoid macular edema. PED - pigment epithelial detachment. IRF - intraretinal fluid. SRF - subretinal fluid. EZ - ellipsoid zone. ERM - epiretinal membrane. ORA - outer retinal atrophy. ORT - outer retinal tubulation. SRHM - subretinal hyper-reflective material. IRHM - intraretinal hyper-reflective material                ASSESSMENT/PLAN:    ICD-10-CM   1. Posterior vitreous detachment of right eye  H43.811   2. Vitreous hemorrhage of right eye ([place])  H43.11   3. Retinal edema  H35.81 OCT, Retina - OU - Both Eyes  4. Diabetes mellitus type 2 without retinopathy ([place])  E11.9   5. Essential hypertension  I10   6. Hypertensive retinopathy of both eyes  H35.033   7. Combined forms of age-related cataract of both eyes  H25.813     1,2. Hemorrhagic PVD OD  - Onset 12.27.22 -- presented to [facility] on 12.29.21  - symptomatic floaters, no photopsias OD -- improved today  - Discussed findings and prognosis  - No RT or RD on 360 scleral depressed exam  - Reviewed s/s of RT/RD  - Strict return precautions for any such RT/RD signs/symptoms  - VH precautions reviewed -- minimize activities, keep head elevated, avoid ASA/NSAIDs/blood thinners as able  - pt is cleared from a retina standpoint for release to [employer], PA-C and resumption of primary eye care  3. No retinal edema on exam or OCT  4. Diabetes mellitus, type 2 without retinopathy - The incidence, risk factors for progression, natural history and treatment options for diabetic retinopathy  were discussed  with patient.   - The need for close monitoring of blood glucose, blood pressure, and serum lipids, avoiding cigarette or any type of tobacco, and the need for long term follow up was also discussed with patient. - f/u in 1 year, sooner prn  5,6. Hypertensive retinopathy OU - discussed importance of tight BP control - monitor  7. Mixed Cataract OU - The symptoms of cataract, surgical options, and treatments and risks were discussed with patient. - discussed diagnosis and progression  Ophthalmic Meds Ordered this visit:  No orders of the defined types were placed in this encounter.      Return if symptoms worsen or fail to improve.  There are no Patient Instructions on file for this visit.  Explained the diagnoses, plan, and follow up with the patient and they expressed understanding.  Patient expressed understanding of the importance of proper follow up care.   This document serves as a record of services personally performed by Gardiner Sleeper, MD, PhD. It was created on their behalf by Roselee Nova, COMT. The creation of this record is the provider's dictation and/or activities during the visit.  Electronically signed by: Roselee Nova, COMT 08/02/20 1:13 AM   This document serves as a record of services personally performed by Gardiner Sleeper, MD, PhD. It was created on their behalf by San Jetty. Owens Shark, OA an ophthalmic technician. The creation of this record is the provider's dictation and/or activities during the visit.    Electronically signed by: San Jetty. Owens Shark, [place] 01.26.2022 1:13  AM   Gardiner Sleeper, M.D., Ph.D. Diseases & Surgery of the Retina and Vitreous [facility]  I have reviewed the above documentation for accuracy and completeness, and I agree with the above. Gardiner Sleeper, M.D., Ph.D. 08/02/20 1:13 AM   Abbreviations: M myopia (nearsighted); A astigmatism; H hyperopia (farsighted); P presbyopia; Mrx spectacle prescription;  CTL  contact lenses; OD right eye; OS left eye; OU both eyes  XT exotropia; ET esotropia; PEK punctate epithelial keratitis; PEE punctate epithelial erosions; DES dry eye syndrome; MGD meibomian gland dysfunction; ATs artificial tears; PFAT's preservative free artificial tears; [place] nuclear sclerotic cataract; PSC posterior subcapsular cataract; ERM epi-retinal membrane; PVD posterior vitreous detachment; RD retinal detachment; DM diabetes mellitus; DR diabetic retinopathy; NPDR non-proliferative diabetic retinopathy; PDR proliferative diabetic retinopathy; CSME clinically significant macular edema; DME diabetic macular edema; dbh dot blot hemorrhages; CWS cotton wool spot; POAG primary open angle glaucoma; C/D cup-to-disc ratio; HVF humphrey visual field; GVF goldmann visual field; OCT optical coherence tomography; IOP intraocular pressure; BRVO Branch retinal vein occlusion; CRVO central retinal vein occlusion; CRAO central retinal artery occlusion; BRAO branch retinal artery occlusion; RT retinal tear; SB scleral buckle; PPV pars plana vitrectomy; VH Vitreous hemorrhage; PRP panretinal laser photocoagulation; IVK intravitreal kenalog; VMT vitreomacular traction; MH Macular hole;  NVD neovascularization of the disc; NVE neovascularization elsewhere; AREDS age related eye disease study; ARMD age related macular degeneration; POAG primary open angle glaucoma; EBMD epithelial/anterior basement membrane dystrophy; ACIOL anterior chamber intraocular lens; IOL intraocular lens; PCIOL posterior chamber intraocular lens; Phaco/IOL phacoemulsification with intraocular lens placement; [place] photorefractive keratectomy; LASIK laser assisted in situ keratomileusis; HTN hypertension; DM diabetes mellitus; COPD chronic obstructive pulmonary disease

## 2020-07-29 ENCOUNTER — Other Ambulatory Visit: Payer: Self-pay

## 2020-07-29 ENCOUNTER — Ambulatory Visit (INDEPENDENT_AMBULATORY_CARE_PROVIDER_SITE_OTHER): Payer: Medicare HMO | Admitting: Ophthalmology

## 2020-07-29 ENCOUNTER — Encounter (INDEPENDENT_AMBULATORY_CARE_PROVIDER_SITE_OTHER): Payer: Self-pay | Admitting: Ophthalmology

## 2020-07-29 DIAGNOSIS — H35033 Hypertensive retinopathy, bilateral: Secondary | ICD-10-CM | POA: Diagnosis not present

## 2020-07-29 DIAGNOSIS — H25813 Combined forms of age-related cataract, bilateral: Secondary | ICD-10-CM | POA: Diagnosis not present

## 2020-07-29 DIAGNOSIS — H3581 Retinal edema: Secondary | ICD-10-CM

## 2020-07-29 DIAGNOSIS — H43811 Vitreous degeneration, right eye: Secondary | ICD-10-CM | POA: Diagnosis not present

## 2020-07-29 DIAGNOSIS — E119 Type 2 diabetes mellitus without complications: Secondary | ICD-10-CM

## 2020-07-29 DIAGNOSIS — I10 Essential (primary) hypertension: Secondary | ICD-10-CM

## 2020-07-29 DIAGNOSIS — H4311 Vitreous hemorrhage, right eye: Secondary | ICD-10-CM | POA: Diagnosis not present

## 2020-08-05 ENCOUNTER — Other Ambulatory Visit: Payer: Self-pay

## 2020-08-05 ENCOUNTER — Ambulatory Visit: Payer: Medicare HMO

## 2020-08-13 DIAGNOSIS — Z7984 Long term (current) use of oral hypoglycemic drugs: Secondary | ICD-10-CM | POA: Diagnosis not present

## 2020-08-13 DIAGNOSIS — E042 Nontoxic multinodular goiter: Secondary | ICD-10-CM | POA: Diagnosis not present

## 2020-08-13 DIAGNOSIS — E7800 Pure hypercholesterolemia, unspecified: Secondary | ICD-10-CM | POA: Diagnosis not present

## 2020-08-13 DIAGNOSIS — E538 Deficiency of other specified B group vitamins: Secondary | ICD-10-CM | POA: Diagnosis not present

## 2020-08-13 DIAGNOSIS — E1159 Type 2 diabetes mellitus with other circulatory complications: Secondary | ICD-10-CM | POA: Diagnosis not present

## 2020-08-13 DIAGNOSIS — I2510 Atherosclerotic heart disease of native coronary artery without angina pectoris: Secondary | ICD-10-CM | POA: Diagnosis not present

## 2020-08-19 ENCOUNTER — Other Ambulatory Visit: Payer: Self-pay

## 2020-08-19 ENCOUNTER — Ambulatory Visit (INDEPENDENT_AMBULATORY_CARE_PROVIDER_SITE_OTHER): Payer: Medicare HMO

## 2020-08-19 DIAGNOSIS — Z0000 Encounter for general adult medical examination without abnormal findings: Secondary | ICD-10-CM | POA: Diagnosis not present

## 2020-08-19 NOTE — Progress Notes (Signed)
Virtual Visit via Telephone Note  I connected with  Alex Meyers on 08/19/20 at  8:00 AM EST by telephone and verified that I am speaking with the correct person using two identifiers.  Location: Patient: Home Provider: Office Persons participating in the virtual visit: patient/Nurse Health Advisor   I discussed the limitations, risks, security and privacy concerns of performing an evaluation and management service by telephone and the availability of in person appointments. The patient expressed understanding and agreed to proceed.  Interactive audio and video telecommunications were attempted between this nurse and patient, however failed, due to patient having technical difficulties OR patient did not have access to video capability.  We continued and completed visit with audio only.  Some vital signs may be absent or patient reported.   Willette Brace, LPN    Subjective:   Alex Meyers is a 80 y.o. male who presents for Medicare Annual/Subsequent preventive examination.  Review of Systems     Cardiac Risk Factors include: advanced age (>57men, >64 women);diabetes mellitus;hypertension;male gender     Objective:    There were no vitals filed for this visit. There is no height or weight on file to calculate BMI.  Advanced Directives 08/19/2020 05/24/2017 02/07/2017 05/29/2016 01/08/2015 12/31/2014 12/17/2014  Does Patient Have a Medical Advance Directive? No No Yes;No Yes Yes No No  Type of Advance Directive - - Public librarian;Living will - - -  Does patient want to make changes to medical advance directive? - - Yes (ED - Information included in AVS) - - - -  Copy of [place] in Chart? - - - Yes No - copy requested - -  Would patient like information on creating a medical advance directive? - Yes (ED - Information included in AVS) - - - No - patient declined information -    Current Medications (verified) Outpatient Encounter Medications  as of 08/19/2020  Medication Sig  . clopidogrel (PLAVIX) 75 MG tablet TAKE 1 TABLET(75 MG) BY MOUTH DAILY  . cyanocobalamin 1000 MCG tablet Take 2 tablets once a day  . empagliflozin (JARDIANCE) 10 MG TABS tablet Take by mouth.  . Glucosamine HCl 1000 MG TABS Take 1,000 mg by mouth daily.  . meloxicam (MOBIC) 15 MG tablet Take 1 tablet (15 mg total) by mouth daily.  . metFORMIN (GLUCOPHAGE-XR) 500 MG 24 hr tablet Take 1 tablet by mouth at bedtime.  . pioglitazone (ACTOS) 45 MG tablet Take 45 mg by mouth daily.  . rosuvastatin (CRESTOR) 20 MG tablet Take 1 tablet (20 mg total) by mouth daily.  . sitaGLIPtin (JANUVIA) 100 MG tablet Take 1 tablet by mouth once a day for diabetes  . Dapsone 5 % topical gel   . nitroGLYCERIN (NITRODUR - DOSED IN MG/24 HR) 0.2 mg/hr patch UNWRAP AND APPLY 1/4 PATCH TO THE AFFECTED AREA DAILY (Patient not taking: Reported on 08/19/2020)  . sildenafil (VIAGRA) 50 MG tablet Take 1 tablet as directed as needed (Patient not taking: Reported on 08/19/2020)   No facility-administered encounter medications on file as of 08/19/2020.    Allergies (verified) Patient has no known allergies.   History: Past Medical History:  Diagnosis Date  . Coronary artery disease    s//p DES LAD and Dx1 01/2009  . Diabetes mellitus   . Diverticulosis   . Hx of colonic polyps 08/2010   Dr. Hilarie Fredrickson  . Hyperlipidemia    pt denies  . Hypertension    pt. denies  .  Internal hemorrhoids with Grade 2-3 prolapse and bleeding 09/25/2012   Diagnosed by Dr. Linda Hedges via anoscopy   . Multiple thyroid nodules   . Personal history of colonic polyps 08/09/2002   ADENOMATOUS POLYP  . Tubular adenoma of colon    Past Surgical History:  Procedure Laterality Date  . COLONOSCOPY  multiple  . CORONARY STENT PLACEMENT  01/22/2009   right side  . WRIST SURGERY     right side   Family History  Problem Relation Age of Onset  . Heart failure Mother   . Coronary artery disease Mother   . Bladder Cancer  Father 42  . Breast cancer Daughter   . Colon cancer Neg Hx    Social History   Socioeconomic History  . Marital status: Divorced    Spouse name: Not on file  . Number of children: 3  . Years of education: Not on file  . Highest education level: Not on file  Occupational History  . Occupation: LAWYER    Employer: [employer]  Tobacco Use  . Smoking status: Never Smoker  . Smokeless tobacco: Never Used  Vaping Use  . Vaping Use: Never used  Substance and Sexual Activity  . Alcohol use: Yes    Alcohol/week: 4.0 standard drinks    Types: 4 Cans of beer per week    Comment: weekly  . Drug use: No  . Sexual activity: Not Currently  Other Topics Concern  . Not on file  Social History Narrative   [name], Law school - [employer] . Married '78 the patient is divorced after 39 years of marriage. Has 3 daughters. He is an active attorney, formerly with [employer] but he has recently ('12) gone out on his own. Serially monogamous, does not always practice safe sex.    Social Determinants of Health   Financial Resource Strain: Low Risk   . Difficulty of Paying Living Expenses: Not hard at all  Food Insecurity: No Food Insecurity  . Worried About Charity fundraiser in the Last Year: Never true  . Ran Out of Food in the Last Year: Never true  Transportation Needs: No Transportation Needs  . Lack of Transportation (Medical): No  . Lack of Transportation (Non-Medical): No  Physical Activity: Sufficiently Active  . Days of Exercise per Week: 5 days  . Minutes of Exercise per Session: 110 min  Stress: No Stress Concern Present  . Feeling of Stress : Not at all  Social Connections: Socially Isolated  . Frequency of Communication with Friends and Family: More than three times a week  . Frequency of Social Gatherings with Friends and Family: More than three times a week  . Attends Religious Services: Never  . Active Member of Clubs or Organizations: No  . Attends  Archivist Meetings: Never  . Marital Status: Divorced    Tobacco Counseling Counseling given: Not Answered   Clinical Intake:  Pre-visit preparation completed: Yes  Pain : No/denies pain     BMI - recorded: 22.58 Nutritional Status: BMI of 19-24  Normal Nutritional Risks: None Diabetes: Yes CBG done?: No Did pt. bring in CBG monitor from home?: No  How often do you need to have someone help you when you read instructions, pamphlets, or other written materials from your doctor or pharmacy?: 1 - Never  Diabetic?Nutrition Risk Assessment:  Has the patient had any N/V/D within the last 2 months?  No  Does the patient have any non-healing wounds?  No  Has the patient had any unintentional weight loss or weight gain?  No   Diabetes:  Is the patient diabetic?  Yes  If diabetic, was a CBG obtained today?  No  Did the patient bring in their glucometer from home?  No  How often do you monitor your CBG's? N/A.   Financial Strains and Diabetes Management:  Are you having any financial strains with the device, your supplies or your medication? No .  Does the patient want to be seen by Chronic Care Management for management of their diabetes?  No  Would the patient like to be referred to a Nutritionist or for Diabetic Management?  No   Diabetic Exams:  Diabetic Eye Exam: Overdue for diabetic eye exam. Pt has been advised about the importance in completing this exam. Patient advised to call and schedule an eye exam. Diabetic Foot Exam: Overdue, Pt has been advised about the importance in completing this exam. Pt is scheduled for diabetic foot exam on next appt .  Interpreter Needed?: No  Information entered by :: Charlott Rakes, LPN   Activities of Daily Living In your present state of health, do you have any difficulty performing the following activities: 08/19/2020  Hearing? N  Vision? N  Difficulty concentrating or making decisions? Y  Comment forget where i  put thing at times  Walking or climbing stairs? N  Dressing or bathing? N  Doing errands, shopping? N  Preparing Food and eating ? N  Using the Toilet? N  In the past six months, have you accidently leaked urine? Y  Comment leaking  Do you have problems with loss of bowel control? N  Managing your Medications? N  Managing your Finances? N  Housekeeping or managing your Housekeeping? N  Some recent data might be hidden    Patient Care Team: Eulas Post, MD as PCP - General (Family Medicine) Sherren Mocha, MD as PCP - Cardiology (Cardiology) Pyrtle, Lajuan Lines, MD (Gastroenterology) Altheimer, Legrand Como, MD as Consulting Physician (Endocrinology) Clent Jacks, MD as Consulting Physician (Ophthalmology) Stefanie Libel, MD as Consulting Physician (Sports Medicine) Sherren Mocha, MD as Consulting Physician (Cardiology) Sydnee Levans, MD as Consulting Physician (Dermatology) Domingo Pulse, MD as Consulting Physician (Urology) Geoffry Paradise as Consulting Physician (Oncology)  Indicate any recent Medical Services you may have received from other than [facility] providers in the past year (date may be approximate).     Assessment:   This is a routine wellness examination for Arno.  Hearing/Vision screen  Hearing Screening   125Hz  250Hz  500Hz  1000Hz  2000Hz  3000Hz  4000Hz  6000Hz  8000Hz   Right ear:           Left ear:           Comments: Pt denies any hearing   Vision Screening Comments: Dr Clent Jacks for annual eye exams   Dietary issues and exercise activities discussed: Current Exercise Habits: Home exercise routine, Type of exercise: Other - see comments;walking (runner), Time (Minutes): > 60, Frequency (Times/Week): 5, Weekly Exercise (Minutes/Week): 0  Goals    . Patient Stated     I would like to play more tennis so that I can be more competitive when playing with my peers.    . Patient Stated     Get A1C down below 7       Depression Screen PHQ 2/9  Scores 08/19/2020 06/15/2018 05/24/2017 05/29/2016 12/31/2014 09/01/2014 09/01/2014  PHQ - 2 Score 0 0 2 0 0 0 0  PHQ- 9 Score -  0 3 - - - -  Exception Documentation - - - - Other- indicate reason in comment box - -    Fall Risk Fall Risk  08/19/2020 04/07/2020 06/15/2018 05/24/2017 02/07/2017  Falls in the past year? 0 1 0 Yes No  Comment - - - patient has fallen while jogging -  Number falls in past yr: 0 0 0 2 or more -  Injury with Fall? 0 1 1 No -  Risk for fall due to : Impaired vision Other (Comment) - - -  Risk for fall due to: Comment - Patient is a runner - - -  Follow up Falls prevention discussed Falls evaluation completed - - -    FALL RISK PREVENTION PERTAINING TO THE HOME:  Any stairs in or around the home? Yes  If so, are there any without handrails? No  Home free of loose throw rugs in walkways, pet beds, electrical cords, etc? Yes  Adequate lighting in your home to reduce risk of falls? Yes   ASSISTIVE DEVICES UTILIZED TO PREVENT FALLS:  Life alert? No  Use of a cane, walker or w/c? No  Grab bars in the bathroom? No  Shower chair or bench in shower? No  Elevated toilet seat or a handicapped toilet? No   TIMED UP AND GO:  Was the test performed? No .    Cognitive Function: MMSE - Mini Mental State Exam 05/24/2017  Orientation to time 5  Orientation to Place 5  Registration 3  Attention/ Calculation 5  Recall 1  Language- name 2 objects 2  Language- repeat 1  Language- follow 3 step command 3  Language- read & follow direction 1  Write a sentence 1  Copy design 1  Total score 28     6CIT Screen 08/19/2020  What Year? 0 points  What month? 0 points  Count back from 20 0 points  Months in reverse 0 points  Repeat phrase 0 points    Immunizations Immunization History  Administered Date(s) Administered  . Influenza Split 04/24/2014  . Influenza, High Dose Seasonal PF 03/05/2015, 05/23/2016, 04/13/2017, 05/18/2017, 04/13/2018, 03/22/2019, 03/25/2020   . Influenza-Unspecified 06/03/2012, 04/13/2017  . Moderna Sars-Covid-2 Vaccination 07/18/2019, 08/15/2019, 05/13/2020  . Pneumococcal Conjugate-13 09/01/2014  . Pneumococcal Polysaccharide-23 01/01/2013  . Tdap 01/01/2013, 12/11/2017  . Zoster Recombinat (Shingrix) 12/18/2017, 02/19/2018    TDAP status: Up to date  Flu Vaccine status: Up to date  Pneumococcal vaccine status: Up to date  Covid-19 vaccine status: Completed vaccines  Qualifies for Shingles Vaccine? Yes   Zostavax completed Yes   Shingrix Completed?: Yes  Screening Tests Health Maintenance  Topic Date Due  . Hepatitis C Screening  Never done  . URINE MICROALBUMIN  04/06/2018  . FOOT EXAM  06/15/2018  . HEMOGLOBIN A1C  05/29/2020  . OPHTHALMOLOGY EXAM  06/17/2020  . TETANUS/TDAP  12/12/2027  . INFLUENZA VACCINE  Completed  . COVID-19 Vaccine  Completed  . PNA vac Low Risk Adult  Completed    Health Maintenance  Health Maintenance Due  Topic Date Due  . Hepatitis C Screening  Never done  . URINE MICROALBUMIN  04/06/2018  . FOOT EXAM  06/15/2018  . HEMOGLOBIN A1C  05/29/2020  . OPHTHALMOLOGY EXAM  06/17/2020    Colorectal cancer screening: No longer required.    Additional Screening:  Hepatitis C Screening: does qualify;  Vision Screening: Recommended annual ophthalmology exams for early detection of glaucoma and other disorders of the eye. Is the patient up to  date with their annual eye exam?  Yes  Who is the provider or what is the name of the office in which the patient attends annual eye exams? Dr Katy Fitch If pt is not established with a provider, would they like to be referred to a provider to establish care? No .   Dental Screening: Recommended annual dental exams for proper oral hygiene  Community Resource Referral / Chronic Care Management: CRR required this visit?  No   CCM required this visit?  No      Plan:     I have personally reviewed and noted the following in the patient's  chart:   . Medical and social history . Use of alcohol, tobacco or illicit drugs  . Current medications and supplements . Functional ability and status . Nutritional status . Physical activity . Advanced directives . List of other physicians . Hospitalizations, surgeries, and ER visits in previous 12 months . Vitals . Screenings to include cognitive, depression, and falls . Referrals and appointments  In addition, I have reviewed and discussed with patient certain preventive protocols, quality metrics, and best practice recommendations. A written personalized care plan for preventive services as well as general preventive health recommendations were provided to patient.     Willette Brace, LPN   3/00/7622   Nurse Notes: pt made an appt 08/24/20 at 9:00 a.m for multiple health concern #1 being Diabetes, he is also due for Hep C screening, urine microalbumin, foot and opthalmology exam

## 2020-08-19 NOTE — Patient Instructions (Addendum)
Alex Meyers , Thank you for taking time to come for your Medicare Wellness Visit. I appreciate your ongoing commitment to your health goals. Please review the following plan we discussed and let me know if I can assist you in the future.   Screening recommendations/referrals: Colonoscopy: 02/19/19 Recommended yearly ophthalmology/optometry visit for glaucoma screening and checkup Recommended yearly dental visit for hygiene and checkup  Vaccinations: Influenza vaccine: Done 03/25/20.utd Pneumococcal vaccine: Up to date Tdap vaccine: Up to date Shingles vaccine: Completed 6/17 & 02/19/18  Covid-19: Completed 1/14 & 08/15/19, 05/13/20  Advanced directives: Will pick up copy from office  Conditions/risks identified: Reduce A1C   Next appointment: Follow up in one year for your annual wellness visit.   Preventive Care 21 Years and Older, Male Preventive care refers to lifestyle choices and visits with your health care provider that can promote health and wellness. What does preventive care include?  A yearly physical exam. This is also called an annual well check.  Dental exams once or twice a year.  Routine eye exams. Ask your health care provider how often you should have your eyes checked.  Personal lifestyle choices, including:  Daily care of your teeth and gums.  Regular physical activity.  Eating a healthy diet.  Avoiding tobacco and drug use.  Limiting alcohol use.  Practicing safe sex.  Taking low doses of aspirin every day.  Taking vitamin and mineral supplements as recommended by your health care provider. What happens during an annual well check? The services and screenings done by your health care provider during your annual well check will depend on your age, overall health, lifestyle risk factors, and family history of disease. Counseling  Your health care provider may ask you questions about your:  Alcohol use.  Tobacco use.  Drug use.  Emotional  well-being.  Home and relationship well-being.  Sexual activity.  Eating habits.  History of falls.  Memory and ability to understand (cognition).  Work and work Statistician. Screening  You may have the following tests or measurements:  Height, weight, and BMI.  Blood pressure.  Lipid and cholesterol levels. These may be checked every 5 years, or more frequently if you are over 88 years old.  Skin check.  Lung cancer screening. You may have this screening every year starting at age 28 if you have a 30-pack-year history of smoking and currently smoke or have quit within the past 15 years.  Fecal occult blood test (FOBT) of the stool. You may have this test every year starting at age 97.  Flexible sigmoidoscopy or colonoscopy. You may have a sigmoidoscopy every 5 years or a colonoscopy every 10 years starting at age 59.  Prostate cancer screening. Recommendations will vary depending on your family history and other risks.  Hepatitis C blood test.  Hepatitis B blood test.  Sexually transmitted disease (STD) testing.  Diabetes screening. This is done by checking your blood sugar (glucose) after you have not eaten for a while (fasting). You may have this done every 1-3 years.  Abdominal aortic aneurysm (AAA) screening. You may need this if you are a current or former smoker.  Osteoporosis. You may be screened starting at age 15 if you are at high risk. Talk with your health care provider about your test results, treatment options, and if necessary, the need for more tests. Vaccines  Your health care provider may recommend certain vaccines, such as:  Influenza vaccine. This is recommended every year.  Tetanus, diphtheria, and acellular pertussis (  Tdap, Td) vaccine. You may need a Td booster every 10 years.  Zoster vaccine. You may need this after age 68.  Pneumococcal 13-valent conjugate (PCV13) vaccine. One dose is recommended after age 13.  Pneumococcal  polysaccharide (PPSV23) vaccine. One dose is recommended after age 44. Talk to your health care provider about which screenings and vaccines you need and how often you need them. This information is not intended to replace advice given to you by your health care provider. Make sure you discuss any questions you have with your health care provider. Document Released: 07/17/2015 Document Revised: 03/09/2016 Document Reviewed: 04/21/2015 Elsevier Interactive Patient Education  2017 [employer] Prevention in the Home Falls can cause injuries. They can happen to people of all ages. There are many things you can do to make your home safe and to help prevent falls. What can I do on the outside of my home?  Regularly fix the edges of walkways and driveways and fix any cracks.  Remove anything that might make you trip as you walk through a door, such as a raised step or threshold.  Trim any bushes or trees on the path to your home.  Use bright outdoor lighting.  Clear any walking paths of anything that might make someone trip, such as rocks or tools.  Regularly check to see if handrails are loose or broken. Make sure that both sides of any steps have handrails.  Any raised decks and porches should have guardrails on the edges.  Have any leaves, snow, or ice cleared regularly.  Use sand or salt on walking paths during winter.  Clean up any spills in your garage right away. This includes oil or grease spills. What can I do in the bathroom?  Use night lights.  Install grab bars by the toilet and in the tub and shower. Do not use towel bars as grab bars.  Use non-skid mats or decals in the tub or shower.  If you need to sit down in the shower, use a plastic, non-slip stool.  Keep the floor dry. Clean up any water that spills on the floor as soon as it happens.  Remove soap buildup in the tub or shower regularly.  Attach bath mats securely with double-sided non-slip rug  tape.  Do not have throw rugs and other things on the floor that can make you trip. What can I do in the bedroom?  Use night lights.  Make sure that you have a light by your bed that is easy to reach.  Do not use any sheets or blankets that are too big for your bed. They should not hang down onto the floor.  Have a firm chair that has side arms. You can use this for support while you get dressed.  Do not have throw rugs and other things on the floor that can make you trip. What can I do in the kitchen?  Clean up any spills right away.  Avoid walking on wet floors.  Keep items that you use a lot in easy-to-reach places.  If you need to reach something above you, use a strong step stool that has a grab bar.  Keep electrical cords out of the way.  Do not use floor polish or wax that makes floors slippery. If you must use wax, use non-skid floor wax.  Do not have throw rugs and other things on the floor that can make you trip. What can I do with my stairs?  Do  not leave any items on the stairs.  Make sure that there are handrails on both sides of the stairs and use them. Fix handrails that are broken or loose. Make sure that handrails are as long as the stairways.  Check any carpeting to make sure that it is firmly attached to the stairs. Fix any carpet that is loose or worn.  Avoid having throw rugs at the top or bottom of the stairs. If you do have throw rugs, attach them to the floor with carpet tape.  Make sure that you have a light switch at the top of the stairs and the bottom of the stairs. If you do not have them, ask someone to add them for you. What else can I do to help prevent falls?  Wear shoes that:  Do not have high heels.  Have rubber bottoms.  Are comfortable and fit you well.  Are closed at the toe. Do not wear sandals.  If you use a stepladder:  Make sure that it is fully opened. Do not climb a closed stepladder.  Make sure that both sides of the  stepladder are locked into place.  Ask someone to hold it for you, if possible.  Clearly mark and make sure that you can see:  Any grab bars or handrails.  First and last steps.  Where the edge of each step is.  Use tools that help you move around (mobility aids) if they are needed. These include:  Canes.  Walkers.  Scooters.  Crutches.  Turn on the lights when you go into a dark area. Replace any light bulbs as soon as they burn out.  Set up your furniture so you have a clear path. Avoid moving your furniture around.  If any of your floors are uneven, fix them.  If there are any pets around you, be aware of where they are.  Review your medicines with your doctor. Some medicines can make you feel dizzy. This can increase your chance of falling. Ask your doctor what other things that you can do to help prevent falls. This information is not intended to replace advice given to you by your health care provider. Make sure you discuss any questions you have with your health care provider. Document Released: 04/16/2009 Document Revised: 11/26/2015 Document Reviewed: 07/25/2014 Elsevier Interactive Patient Education  2017 [employer].

## 2020-08-21 ENCOUNTER — Other Ambulatory Visit: Payer: Self-pay

## 2020-08-24 ENCOUNTER — Other Ambulatory Visit: Payer: Self-pay

## 2020-08-24 ENCOUNTER — Encounter: Payer: Self-pay | Admitting: Family Medicine

## 2020-08-24 ENCOUNTER — Ambulatory Visit (INDEPENDENT_AMBULATORY_CARE_PROVIDER_SITE_OTHER): Payer: Medicare HMO | Admitting: Family Medicine

## 2020-08-24 VITALS — BP 104/70 | HR 73 | Ht 71.5 in | Wt 163.0 lb

## 2020-08-24 DIAGNOSIS — R58 Hemorrhage, not elsewhere classified: Secondary | ICD-10-CM | POA: Diagnosis not present

## 2020-08-24 DIAGNOSIS — N3943 Post-void dribbling: Secondary | ICD-10-CM

## 2020-08-24 DIAGNOSIS — R682 Dry mouth, unspecified: Secondary | ICD-10-CM

## 2020-08-24 NOTE — Progress Notes (Signed)
Established Patient Office Visit  Subjective:  Patient ID: Alex Meyers, male    DOB: 1940-12-20  Age: 80 y.o. MRN: [id number]  CC: No chief complaint on file.   HPI Alex Meyers presents for physical exam.  He has history of type 2 diabetes, osteoarthritis, BPH, dyslipidemia.  He is followed by endocrinology.  He is concerned because his recent A1c had climbed to 7.6%.  Takes several by diabetes medications currently.  We discussed the following issues today  Dry mouth.  Symptoms occur basically just early morning when he first wakes up.  No significant urine frequency.  No dry eye symptoms.  After sipping on some fluids for a few minutes this resolves.  Thinks he may be mouth breathing.  Denies any major nasal congestion  Occasional urine leakage.  He states this is occasionally few drops.  Occasionally after urinating but sometimes even before going to the bathroom.  No stool incontinence.  Occasional urgency but symptoms relatively mild.  Occasionally gets up at night but not consistently.  Denies slow stream.  Easy bruising of hands and forearms.  He does take Plavix.  Mostly this is on upper extremities.  Denies any other bleeding complications such as nosebleeds or gum bleeding.  He is still running about 3 days/week and usually runs 6 miles with each run  Past Medical History:  Diagnosis Date  . Coronary artery disease    s//p DES LAD and Dx1 01/2009  . Diabetes mellitus   . Diverticulosis   . Hx of colonic polyps 08/2010   Dr. Hilarie Fredrickson  . Hyperlipidemia    pt denies  . Hypertension    pt. denies  . Internal hemorrhoids with Grade 2-3 prolapse and bleeding 09/25/2012   Diagnosed by Dr. Linda Hedges via anoscopy   . Multiple thyroid nodules   . Personal history of colonic polyps 08/09/2002   ADENOMATOUS POLYP  . Tubular adenoma of colon     Past Surgical History:  Procedure Laterality Date  . COLONOSCOPY  multiple  . CORONARY STENT PLACEMENT  01/22/2009   right side  .  WRIST SURGERY     right side    Family History  Problem Relation Age of Onset  . Heart failure Mother   . Coronary artery disease Mother   . Bladder Cancer Father 35  . Breast cancer Daughter   . Colon cancer Neg Hx     Social History   Socioeconomic History  . Marital status: Divorced    Spouse name: Not on file  . Number of children: 3  . Years of education: Not on file  . Highest education level: Not on file  Occupational History  . Occupation: LAWYER    Employer: [employer]  Tobacco Use  . Smoking status: Never Smoker  . Smokeless tobacco: Never Used  Vaping Use  . Vaping Use: Never used  Substance and Sexual Activity  . Alcohol use: Yes    Alcohol/week: 4.0 standard drinks    Types: 4 Cans of beer per week    Comment: weekly  . Drug use: No  . Sexual activity: Not Currently  Other Topics Concern  . Not on file  Social History Narrative   Alex Meyers, Law school - [employer] . Married '78 the patient is divorced after 30 years of marriage. Has 3 daughters. He is an active attorney, formerly with [employer] but he has recently ('12) gone out on his own. Serially monogamous, does not always practice safe sex.  Social Determinants of Health   Financial Resource Strain: Low Risk   . Difficulty of Paying Living Expenses: Not hard at all  Food Insecurity: No Food Insecurity  . Worried About Charity fundraiser in the Last Year: Never true  . Ran Out of Food in the Last Year: Never true  Transportation Needs: No Transportation Needs  . Lack of Transportation (Medical): No  . Lack of Transportation (Non-Medical): No  Physical Activity: Sufficiently Active  . Days of Exercise per Week: 5 days  . Minutes of Exercise per Session: 110 min  Stress: No Stress Concern Present  . Feeling of Stress : Not at all  Social Connections: Socially Isolated  . Frequency of Communication with Friends and Family: More than three times a week  . Frequency of Social  Gatherings with Friends and Family: More than three times a week  . Attends Religious Services: Never  . Active Member of Clubs or Organizations: No  . Attends Archivist Meetings: Never  . Marital Status: Divorced  Human resources officer Violence: Not At Risk  . Fear of Current or Ex-Partner: No  . Emotionally Abused: No  . Physically Abused: No  . Sexually Abused: No    Outpatient Medications Prior to Visit  Medication Sig Dispense Refill  . clopidogrel (PLAVIX) 75 MG tablet TAKE 1 TABLET(75 MG) BY MOUTH DAILY 90 tablet 3  . cyanocobalamin 1000 MCG tablet Take 2 tablets once a day    . Dapsone 5 % topical gel     . empagliflozin (JARDIANCE) 10 MG TABS tablet Take by mouth.    . Glucosamine HCl 1000 MG TABS Take 1,000 mg by mouth daily.    . meloxicam (MOBIC) 15 MG tablet Take 1 tablet (15 mg total) by mouth daily. 90 tablet 3  . metFORMIN (GLUCOPHAGE-XR) 500 MG 24 hr tablet Take 1 tablet by mouth at bedtime.  12  . pioglitazone (ACTOS) 45 MG tablet Take 45 mg by mouth daily.  11  . rosuvastatin (CRESTOR) 20 MG tablet Take 1 tablet (20 mg total) by mouth daily. 30 tablet 12  . sitaGLIPtin (JANUVIA) 100 MG tablet Take 1 tablet by mouth once a day for diabetes    . nitroGLYCERIN (NITRODUR - DOSED IN MG/24 HR) 0.2 mg/hr patch UNWRAP AND APPLY 1/4 PATCH TO THE AFFECTED AREA DAILY (Patient not taking: Reported on 08/19/2020) 30 patch 1  . sildenafil (VIAGRA) 50 MG tablet Take 1 tablet as directed as needed (Patient not taking: Reported on 08/19/2020)     No facility-administered medications prior to visit.    No Known Allergies  ROS Review of Systems  Constitutional: Negative for appetite change, chills, fatigue, fever and unexpected weight change.  Eyes: Negative for visual disturbance.  Respiratory: Negative for cough, chest tightness and shortness of breath.   Cardiovascular: Negative for chest pain, palpitations and leg swelling.  Endocrine: Negative for polydipsia and  polyuria.  Genitourinary: Negative for dysuria.  Neurological: Negative for dizziness, syncope, weakness, light-headedness and headaches.      Objective:    Physical Exam Constitutional:      Appearance: He is well-developed and well-nourished.  HENT:     Right Ear: External ear normal.     Left Ear: External ear normal.     Mouth/Throat:     Mouth: Oropharynx is clear and moist. Mucous membranes are moist.     Pharynx: Oropharynx is clear.  Eyes:     Pupils: Pupils are equal, round, and reactive to  light.  Neck:     Thyroid: No thyromegaly.  Cardiovascular:     Rate and Rhythm: Normal rate and regular rhythm.  Pulmonary:     Effort: Pulmonary effort is normal. No respiratory distress.     Breath sounds: Normal breath sounds. No wheezing or rales.  Musculoskeletal:        General: No edema.     Cervical back: Neck supple.  Skin:    Comments: He does have some very small bruises scattered on the dorsum of both hands as well as dorsal aspect of both forearms.  Neurological:     Mental Status: He is alert and oriented to person, place, and time.     BP 104/70   Pulse 73   Ht 5' 11.5" (1.816 m)   Wt 163 lb (73.9 kg)   SpO2 99%   BMI 22.42 kg/m  Wt Readings from Last 3 Encounters:  08/24/20 163 lb (73.9 kg)  07/13/20 164 lb 3.2 oz (74.5 kg)  04/07/20 161 lb 6 oz (73.2 kg)     Health Maintenance Due  Topic Date Due  . Hepatitis C Screening  Never done  . URINE MICROALBUMIN  04/06/2018  . FOOT EXAM  06/15/2018  . HEMOGLOBIN A1C  05/29/2020  . OPHTHALMOLOGY EXAM  06/17/2020    There are no preventive care reminders to display for this patient.  Lab Results  Component Value Date   TSH 3.04 08/27/2018   Lab Results  Component Value Date   WBC 4.4 05/24/2016   HGB 6.5 (A) 04/06/2017   HCT 39.4 05/24/2016   MCV 87.7 05/24/2016   PLT 120.0 (L) 05/24/2016   Lab Results  Component Value Date   NA 141 11/27/2019   K 4.5 11/27/2019   CO2 28 (A) 11/27/2019    GLUCOSE 131 (H) 05/24/2016   BUN 20 11/27/2019   CREATININE 0.9 11/27/2019   BILITOT 1.0 05/24/2016   ALKPHOS 33 11/27/2019   AST 21 11/27/2019   ALT 13 11/27/2019   PROT 5.7 06/24/2017   ALBUMIN 4.0 11/27/2019   CALCIUM 9.7 11/27/2019   ANIONGAP 8 06/24/2017   GFR 76.88 05/24/2016   Lab Results  Component Value Date   CHOL 134 11/27/2019   Lab Results  Component Value Date   HDL 66 11/27/2019   Lab Results  Component Value Date   LDLCALC 54 11/27/2019   Lab Results  Component Value Date   TRIG 77 11/27/2019   Lab Results  Component Value Date   CHOLHDL 2 05/24/2016   Lab Results  Component Value Date   HGBA1C 6.8 11/27/2019      Assessment & Plan:   #1 dry mouth symptoms.  This is only early morning with awakening.  Suspect related to mouth breathing at night.  No major nasal congestive symptoms. Does not describe any dry eyes to suggest likely sicca syndrome.  Keep sugar-free candy at bed or continue sips as needed with nonsugar containing liquid  #2 easy bruising.  We explained this is likely related to his Plavix and aging.  Reassurance given.  #3 occasional urine dribbling.  This sounds like mostly post void residual.  He is not describing a slow stream.  Occasional mild urgency but not debilitating. -Reassurance and observe for now  No orders of the defined types were placed in this encounter.   Follow-up: No follow-ups on file.    Carolann Littler, MD

## 2020-09-10 ENCOUNTER — Telehealth: Payer: Self-pay

## 2020-09-10 DIAGNOSIS — I2510 Atherosclerotic heart disease of native coronary artery without angina pectoris: Secondary | ICD-10-CM

## 2020-09-10 NOTE — Telephone Encounter (Signed)
The patient is scheduled for GXT 09/15/2020. Attestation order pended for Dr. Burt Knack to sign.

## 2020-09-12 ENCOUNTER — Other Ambulatory Visit (HOSPITAL_COMMUNITY): Payer: Medicare HMO

## 2020-09-14 ENCOUNTER — Other Ambulatory Visit (HOSPITAL_COMMUNITY): Payer: Medicare HMO | Attending: Cardiovascular Disease

## 2020-10-06 ENCOUNTER — Ambulatory Visit (INDEPENDENT_AMBULATORY_CARE_PROVIDER_SITE_OTHER): Payer: Medicare HMO | Admitting: Family Medicine

## 2020-10-06 ENCOUNTER — Other Ambulatory Visit: Payer: Self-pay

## 2020-10-06 ENCOUNTER — Encounter: Payer: Self-pay | Admitting: Family Medicine

## 2020-10-06 VITALS — BP 120/70 | HR 68 | Temp 97.9°F | Wt 155.1 lb

## 2020-10-06 DIAGNOSIS — Z113 Encounter for screening for infections with a predominantly sexual mode of transmission: Secondary | ICD-10-CM | POA: Diagnosis not present

## 2020-10-06 DIAGNOSIS — R5383 Other fatigue: Secondary | ICD-10-CM | POA: Diagnosis not present

## 2020-10-06 DIAGNOSIS — F5104 Psychophysiologic insomnia: Secondary | ICD-10-CM | POA: Diagnosis not present

## 2020-10-06 DIAGNOSIS — G3184 Mild cognitive impairment, so stated: Secondary | ICD-10-CM | POA: Diagnosis not present

## 2020-10-06 NOTE — Progress Notes (Signed)
Established Patient Office Visit  Subjective:  Patient ID: Alex Meyers, male    DOB: Nov 15, 1940  Age: 80 y.o. MRN: [id number]  CC:  Chief Complaint  Patient presents with  . Altered Mental Status    HPI Alex Meyers presents for concerns about poor sleep quality and some possible mild cognitive impairments.  He is accompanied today by one of his 3 daughters, Lattie Haw, who lives in [place] and is here visiting short-term.  Alex Meyers has history of CAD, hypertension, type 2 diabetes, past history of B12 deficiency (on oral replacement).  He is followed by endocrinologist with [facility].  They initially mentioned their concerns that his A1c's have gone up slightly 3 consecutive times.  Last A1c was 7.6%.  Is on several oral medications and apparently there has been some discussion of initiating once daily long-acting insulin.  Patient states he has struggled with difficulty falling asleep and staying asleep for some time.  He has some fatigue which he attributes to this.  He does take melatonin periodically.  Denies regular alcohol use.  He does relate some memory issues such as frequently misplacing his car keys and he attributes a lot of this to sleep deprivation.  Family had concerns about him repeating same stories and explanations again and again.  He reportedly frequently forgets social commitments.  Family has some concerns about poor financial judgment.  They expressed concerns that he had recently put several thousand dollars into some type of scam.  Alex Meyers denies any recent falls or head trauma.  No headache.  Does have history of B12 deficiency.  He is on oral B12 replacement.  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  He divorced several years ago.  Non-smoker.  Stays very engaged with exercise.  He still plays tennis usually couple times per week and tries to run 10K twice per week.  He has 3 daughters.  His sister, Kenna Gilbert, is a retired pediatrician  and they stay in touch frequently.  Emran did reportedly have exposure to agent orange when he was deployed in [place].  He does receive reportedly a small monthly disability check from the [place] to compensate for health problems including his diabetes.  Past Medical History:  Diagnosis Date  . Coronary artery disease    s//p DES LAD and Dx1 01/2009  . Diabetes mellitus   . Diverticulosis   . Hx of colonic polyps 08/2010   Dr. Hilarie Fredrickson  . Hyperlipidemia    pt denies  . Hypertension    pt. denies  . Internal hemorrhoids with Grade 2-3 prolapse and bleeding 09/25/2012   Diagnosed by Dr. Linda Hedges via anoscopy   . Multiple thyroid nodules   . Personal history of colonic polyps 08/09/2002   ADENOMATOUS POLYP  . Tubular adenoma of colon     Past Surgical History:  Procedure Laterality Date  . COLONOSCOPY  multiple  . CORONARY STENT PLACEMENT  01/22/2009   right side  . WRIST SURGERY     right side    Family History  Problem Relation Age of Onset  . Heart failure Mother   . Coronary artery disease Mother   . Bladder Cancer Father 70  . Breast cancer Daughter   . Colon cancer Neg Hx     Social History   Socioeconomic History  . Marital status: Divorced    Spouse name: Not on file  . Number of children: 3  . Years of education: Not on  file  . Highest education level: Not on file  Occupational History  . Occupation: LAWYER    Employer: [employer]  Tobacco Use  . Smoking status: Never Smoker  . Smokeless tobacco: Never Used  Vaping Use  . Vaping Use: Never used  Substance and Sexual Activity  . Alcohol use: Yes    Alcohol/week: 4.0 standard drinks    Types: 4 Cans of beer per week    Comment: weekly  . Drug use: No  . Sexual activity: Not Currently  Other Topics Concern  . Not on file  Social History Narrative   [name], Law school - [employer] . Married '78 the patient is divorced after 107 years of marriage. Has 3 daughters. He is an active attorney, formerly  with [employer] but he has recently ('12) gone out on his own. Serially monogamous, does not always practice safe sex.    Social Determinants of Health   Financial Resource Strain: Low Risk   . Difficulty of Paying Living Expenses: Not hard at all  Food Insecurity: No Food Insecurity  . Worried About Charity fundraiser in the Last Year: Never true  . Ran Out of Food in the Last Year: Never true  Transportation Needs: No Transportation Needs  . Lack of Transportation (Medical): No  . Lack of Transportation (Non-Medical): No  Physical Activity: Sufficiently Active  . Days of Exercise per Week: 5 days  . Minutes of Exercise per Session: 110 min  Stress: No Stress Concern Present  . Feeling of Stress : Not at all  Social Connections: Socially Isolated  . Frequency of Communication with Friends and Family: More than three times a week  . Frequency of Social Gatherings with Friends and Family: More than three times a week  . Attends Religious Services: Never  . Active Member of Clubs or Organizations: No  . Attends Archivist Meetings: Never  . Marital Status: Divorced  Human resources officer Violence: Not At Risk  . Fear of Current or Ex-Partner: No  . Emotionally Abused: No  . Physically Abused: No  . Sexually Abused: No    Outpatient Medications Prior to Visit  Medication Sig Dispense Refill  . clopidogrel (PLAVIX) 75 MG tablet TAKE 1 TABLET(75 MG) BY MOUTH DAILY 90 tablet 3  . cyanocobalamin 1000 MCG tablet Take 2 tablets once a day    . Dapsone 5 % topical gel     . empagliflozin (JARDIANCE) 10 MG TABS tablet Take by mouth.    . Glucosamine HCl 1000 MG TABS Take 1,000 mg by mouth daily.    . meloxicam (MOBIC) 15 MG tablet Take 1 tablet (15 mg total) by mouth daily. 90 tablet 3  . metFORMIN (GLUCOPHAGE-XR) 500 MG 24 hr tablet Take 1 tablet by mouth at bedtime.  12  . pioglitazone (ACTOS) 45 MG tablet Take 45 mg by mouth daily.  11  . rosuvastatin (CRESTOR) 20  MG tablet Take 1 tablet (20 mg total) by mouth daily. 30 tablet 12  . sitaGLIPtin (JANUVIA) 100 MG tablet Take 1 tablet by mouth once a day for diabetes     No facility-administered medications prior to visit.    No Known Allergies  ROS Review of Systems  Constitutional: Negative for appetite change, chills, fever and unexpected weight change.  Respiratory: Negative for cough and shortness of breath.   Cardiovascular: Negative for chest pain.  Gastrointestinal: Negative for abdominal pain.  Neurological: Negative for headaches.  Psychiatric/Behavioral: Positive for sleep  disturbance. Negative for agitation.      Objective:    Physical Exam Vitals reviewed.  Constitutional:      Appearance: Normal appearance.  Cardiovascular:     Rate and Rhythm: Normal rate and regular rhythm.  Pulmonary:     Effort: Pulmonary effort is normal.     Breath sounds: Normal breath sounds.  Neurological:     General: No focal deficit present.     Mental Status: He is alert and oriented to person, place, and time.     Cranial Nerves: No cranial nerve deficit.  Psychiatric:        Mood and Affect: Mood normal.     BP 120/70 (BP Location: Left Arm, Patient Position: Sitting, Cuff Size: Normal)   Pulse 68   Temp 97.9 F (36.6 C) (Oral)   Wt 155 lb 1.6 oz (70.4 kg)   SpO2 96%   BMI 21.33 kg/m  Wt Readings from Last 3 Encounters:  10/06/20 155 lb 1.6 oz (70.4 kg)  08/24/20 163 lb (73.9 kg)  07/13/20 164 lb 3.2 oz (74.5 kg)     Health Maintenance Due  Topic Date Due  . Hepatitis C Screening  Never done  . URINE MICROALBUMIN  04/06/2018  . FOOT EXAM  06/15/2018  . HEMOGLOBIN A1C  05/29/2020  . OPHTHALMOLOGY EXAM  06/17/2020    There are no preventive care reminders to display for this patient.  Lab Results  Component Value Date   TSH 3.04 08/27/2018   Lab Results  Component Value Date   WBC 4.4 05/24/2016   HGB 6.5 (A) 04/06/2017   HCT 39.4 05/24/2016   MCV 87.7 05/24/2016    PLT 120.0 (L) 05/24/2016   Lab Results  Component Value Date   NA 141 11/27/2019   K 4.5 11/27/2019   CO2 28 (A) 11/27/2019   GLUCOSE 131 (H) 05/24/2016   BUN 20 11/27/2019   CREATININE 0.9 11/27/2019   BILITOT 1.0 05/24/2016   ALKPHOS 33 11/27/2019   AST 21 11/27/2019   ALT 13 11/27/2019   PROT 5.7 06/24/2017   ALBUMIN 4.0 11/27/2019   CALCIUM 9.7 11/27/2019   ANIONGAP 8 06/24/2017   GFR 76.88 05/24/2016   Lab Results  Component Value Date   CHOL 134 11/27/2019   Lab Results  Component Value Date   HDL 66 11/27/2019   Lab Results  Component Value Date   LDLCALC 54 11/27/2019   Lab Results  Component Value Date   TRIG 77 11/27/2019   Lab Results  Component Value Date   CHOLHDL 2 05/24/2016   Lab Results  Component Value Date   HGBA1C 6.8 11/27/2019      Assessment & Plan:   Patient presents accompanied by one of his daughters today with concerns for possible cognitive changes.  Patient states he has had poor sleep quality for years and he attributes memory issues to that.  Family has several concerns regarding short-term memory deficits and judgment - for example, in realm of financial management of resources.  -We recommend starting with further labs with TSH, RPR, B12 level -Set up neurocognitive assessment with neuropsychologist to get good baseline testing -Continue healthy lifestyle habits such as exercise and healthy diet -Consider possible MRI of brain but consider neurocognitive assessment first.   No orders of the defined types were placed in this encounter.   Follow-up: No follow-ups on file.    Carolann Littler, MD

## 2020-10-07 LAB — RPR: RPR Ser Ql: NONREACTIVE

## 2020-10-07 LAB — TSH: TSH: 1.6 u[IU]/mL (ref 0.35–4.50)

## 2020-10-07 LAB — VITAMIN B12: Vitamin B-12: 861 pg/mL (ref 211–911)

## 2020-10-13 ENCOUNTER — Encounter: Payer: Self-pay | Admitting: Family Medicine

## 2020-10-13 ENCOUNTER — Telehealth: Payer: Self-pay | Admitting: Family Medicine

## 2020-10-13 DIAGNOSIS — R4189 Other symptoms and signs involving cognitive functions and awareness: Secondary | ICD-10-CM

## 2020-10-13 DIAGNOSIS — R4689 Other symptoms and signs involving appearance and behavior: Secondary | ICD-10-CM

## 2020-10-13 NOTE — Telephone Encounter (Signed)
Spoke with Opal Sidles. She is aware that the MRI order has been placed. I also discussed his recent labs with her. She confirmed that we do have the correct Number on file to reach the patient. Nothing further needed at this time.

## 2020-10-13 NOTE — Telephone Encounter (Signed)
Please let his sister know that I have ordered the MRI.  I had difficulty getting through to speak with Shanon Brow regarding this and was wanting to talk to him first.  We are trying to get this as soon as possible

## 2020-10-13 NOTE — Telephone Encounter (Signed)
Patient  sister is awaiting for the order for the MRI BRAIN .  Please call Opal Sidles [phone] please do not schedule on this Monday

## 2020-10-14 DIAGNOSIS — E1159 Type 2 diabetes mellitus with other circulatory complications: Secondary | ICD-10-CM | POA: Diagnosis not present

## 2020-10-14 DIAGNOSIS — E042 Nontoxic multinodular goiter: Secondary | ICD-10-CM | POA: Diagnosis not present

## 2020-10-14 DIAGNOSIS — E7800 Pure hypercholesterolemia, unspecified: Secondary | ICD-10-CM | POA: Diagnosis not present

## 2020-10-15 DIAGNOSIS — E7800 Pure hypercholesterolemia, unspecified: Secondary | ICD-10-CM | POA: Diagnosis not present

## 2020-10-15 DIAGNOSIS — E042 Nontoxic multinodular goiter: Secondary | ICD-10-CM | POA: Diagnosis not present

## 2020-10-15 DIAGNOSIS — Z7984 Long term (current) use of oral hypoglycemic drugs: Secondary | ICD-10-CM | POA: Diagnosis not present

## 2020-10-15 DIAGNOSIS — E538 Deficiency of other specified B group vitamins: Secondary | ICD-10-CM | POA: Diagnosis not present

## 2020-10-15 DIAGNOSIS — E1159 Type 2 diabetes mellitus with other circulatory complications: Secondary | ICD-10-CM | POA: Diagnosis not present

## 2020-10-15 DIAGNOSIS — I2510 Atherosclerotic heart disease of native coronary artery without angina pectoris: Secondary | ICD-10-CM | POA: Diagnosis not present

## 2020-10-19 ENCOUNTER — Other Ambulatory Visit: Payer: Self-pay

## 2020-10-19 ENCOUNTER — Encounter: Payer: Self-pay | Admitting: Counselor

## 2020-10-19 ENCOUNTER — Ambulatory Visit (INDEPENDENT_AMBULATORY_CARE_PROVIDER_SITE_OTHER): Payer: Medicare HMO | Admitting: Counselor

## 2020-10-19 ENCOUNTER — Ambulatory Visit: Payer: Medicare HMO | Admitting: Psychology

## 2020-10-19 DIAGNOSIS — R4189 Other symptoms and signs involving cognitive functions and awareness: Secondary | ICD-10-CM

## 2020-10-19 DIAGNOSIS — R413 Other amnesia: Secondary | ICD-10-CM

## 2020-10-19 DIAGNOSIS — F0391 Unspecified dementia with behavioral disturbance: Secondary | ICD-10-CM

## 2020-10-19 DIAGNOSIS — F09 Unspecified mental disorder due to known physiological condition: Secondary | ICD-10-CM

## 2020-10-19 NOTE — Progress Notes (Signed)
   Psychometrist Note   Cognitive testing was administered to Staci F Keim by  , B.S. (Technician) under the supervision of Peter Stewart, Psy.D., ABN. Mr. Parfitt was able to tolerate all test procedures. Dr. Stewart met with the patient as needed to manage any emotional reactions to the testing procedures. Rest breaks were offered.    The battery of tests administered was selected by Dr. Stewart with consideration to the patient's current level of functioning, the nature of his symptoms, emotional and behavioral responses during the interview, level of literacy, observed level of motivation/effort, and the nature of the referral question. This battery was communicated to the psychometrist. Communication between Dr. Stewart and the psychometrist was ongoing throughout the evaluation and Dr. Stewart was immediately accessible at all times. Dr. Stewart provided supervision to the technician on the date of this service, to the extent necessary to assure the quality of all services provided.    Mr. Magallon will return in approximately one week for an interactive feedback session with Dr. Stewart, at which time test performance, clinical impressions, and treatment recommendations will be reviewed in detail. The patient understands he can contact our office should he require our assistance before this time.   A total of 120 minutes of billable time were spent with Amiel F Spanbauer by the technician, including test administration and scoring time. Billing for these services is reflected in Dr. Stewart's note.   This note reflects time spent with the psychometrician and does not include test scores, clinical history, or any interpretations made by Dr. Stewart. The full report will follow in a separate note. 
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
## 2020-10-20 ENCOUNTER — Encounter: Payer: Self-pay | Admitting: Counselor

## 2020-10-20 NOTE — Progress Notes (Signed)
TEST SCORES:    Note: This summary of test scores accompanies the interpretive report and should not be interpreted by unqualified individuals or in isolation without reference to the report. Test scores are relative to age, gender, and educational history as available and appropriate.   Performance Validity        "A" Random Letter Test Raw  Descriptor      Errors 0 Within Expectation  The Dot Counting Test: 11 Within Expectation  NAB Effort Index 4 Below Expectation      Mental Status Screening     Total Score Descriptor  MoCA 20 MCI      Expected Functioning        Wide Range Achievement Test: Standard/Scaled Score Percentile      Word Reading 117 87      Re[place]olds Intellectual Screening Test Standard/T-score Percentile      Guess What 51 54      Odd Item Out 52 58  RIST Index 103 58      Attention/Processing Speed        Neuropsychological Assessment Battery (Attention Module, Form 1): T-score Percentile      Digits Forward 50 50      Digits Backwards 64 92      Repeatable Battery for the Assessment of Neuropsychological Status (Form A): Scaled Score Percentile      Coding 7 16      Language        Neuropsychological Assessment Battery (Language Module, Form 1): T-score Percentile      Naming   (28) 38 12      Verbal Fluency: T-score Percentile      Controlled Oral Word Association (F-A-S) 58 79      Semantic Fluency (Animals) 33 5      Memory:        Neuropsychological Assessment Battery (Memory Module, Form 1): T-score Percentile      List Learning           List A Immediate Recall   (5, 6, 6) 40 16         List B Immediate Recall   (3) 42 21         List A Short Delayed Recall   (4) 35 7         List A Long Delayed Recall   (0) 24 <1         List A Percent Retention   (0 %) --- <1         List A Long Delayed Yes/No Recognition Hits   (9) --- 18         List A Long Delayed Yes/No Recognition False Alarms   (13) --- 1         List A Recognition Discriminability  Index --- <1      Story Learning           Immediate Recall   (12, 22) 29 2         Delayed Recall   (0) 29 2         Percent Retention   (0 %) --- <1      Daily Living Memory            Immediate Recall   (18, 10) 32 4          Delayed Recall   (1, 0) 19 <1          Percent Retention (8 %) --- <1  Recognition Hits    (7) --- 18      Repeatable Battery for the Assessment of Neuropsychological Status (Form A): Scaled Score Percentile         Figure Recall   (0) 1 <1      Visuospatial/Constructional Functioning        Repeatable Battery for the Assessment of Neuropsychological Status (Form A): Standard/Scaled Score Percentile     Visuospatial/Constructional Index 109 73         Figure Copy   (17) 9 37         Judgment of Line Orientation   (20) --- >75      Executive Functioning        Modified [employer] Test (MWCST): Standard/T-Score Percentile      Number of Categories Correct 19 <1      Number of Perseverative Errors 31 3      Number of Total Errors 39 14      Percent Perseverative Errors 39 14  Executive Function Composite 59 <1      Trail Making Test: T-Score Percentile      Part A 39 14      Part B 42 21      [place] Diagnostic Aphasia Exam: Raw Score Scaled Score      Complex Ideational Material 8 3      Clock Drawing Raw Score Descriptor      Command 9 WNL      Rating Scales        Clinical Dementia Rating Raw Score Descriptor      Sum of Boxes 5.5 Mild Dementia      Global Score 0.5 MCI      Quick Dementia Rating System Raw Score Descriptor      Sum of Boxes 5 Mild Dementia      Total Score 7.5 Mild Dementia  Geriatric Depression Scale - Short Form 4 Negative   Gadge Hermiz V. Nicole Kindred PsyD, [place] Clinical Neuropsychologist

## 2020-10-21 NOTE — Progress Notes (Signed)
[facility]  Patient Name: Alex Meyers MRN: [id number] Date of Birth: 01-03-1941 Age: 80 y.o. Education: 19 years  Clinical Impressions  Alex Meyers is a 80 y.o., right-hand dominant, divorced man with a history of cognitive and behavioral changes over at least the past year. He has a medical history of DMII, CAD, HTN, T2DM, and vitamin B12 deficiency that is being corrected via oral supplementation. As per the patient's family, there has been significant deterioration in his behavior and cognition over the past year. He has significant memory problems and questionable judgment, as he fell victim to a "mega millions" scam and was wiring money to an unknown invidual. His daughter thinks he spent about $20,000 in this manner and he was having difficulty paying his mortgage as a result. The family have tried to reason with him and explain to him that this is a scam but he insists it is legitimate. He has also stopped upkeeping his house, which is in a sate of squalor as per his daughter. The patient himself presented as having significant anosognosia, he admits to misplacing things once in a while and some forgetfulness but other than that, he does not notice any significant problems. 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   Neuropsychological testing shows objective evidence of decline with respect to memory, executive abilities, and there is also diminished semantic as compared to phonemic fluency. His naming is marginal. His memory test findings show a clear and significant storage problem with virtually no information retained across time and inconsistent benefit to recognition cuing. These are findings often observed in Alzheimer's clinical syndrome. He did well on measures of digit repetition (particularly digit repetition backward), visuospatial/constructional functioning, and processing speed. His  daughter rated him as functioning at a mild dementia level and I was able to rate a CDR for him, the Sum of Boxes score falls in the mild dementia range, and I have the feeling he may have more difficulties than were uncovered during the interview so that may be an overestimate. He screened negative for the presence of depression but as above, he has significant behavior changes and neuropsychiatric symptoms.    [name] is thus demonstrating significant functional impairment and cognitive test findings that support a clinical diagnosis of Alzheimer's dementia. While his poorly controlled DMII, sleep problems or other factors may be contributing, I have a high level of confidence that this is fundamentally a neurodegenerative problem. There is a differential and may be an outside consideration of frontotemporal dementia given prominent behavior changes, but his demographics, the relatively higher base rate of the condition, and his test data make Alzheimer's most likely.  There may also be a vascular contribution as he does have risk factors. I will recommend that he proceed with the MRI that has been ordered by Dr. Elease Hashimoto.   In terms of the patient's clinical care, I would ultimately defer to Dr. Elease Hashimoto who is more familiar with Mr. Clippinger's values and preferences, but my sense is that he has likely lost the capacity to independently make medical and financial decisions of high risk. He certainly did not demonstrate understanding or appreciation of his substantial cognitive problems. He had very impaired performance on several measures involving reasoning and other cognitive abilities pertinent to decision making. He also has significant anosognosia and has resisted his family's attempts to help him in the past. I have the feeling that guardianship may be required to  help them advocate for his interests. I am also concerned about him continuing to practice law given the presence of a dementia level  problem and a history of poor judgment and I will recommend that he retire. Antidementia medication could be considered as deemed clinically appropriate.    Diagnostic Impressions: Alzheimer's dementia, late onset, with behavioral disturbance Anosognosia  Recommendations to be discussed with patient  Your performance and presentation on assessment were consistent with cognitive changes, mainly involving memory and executive function. With regards to your memory problem, I believe it is a storage issue, which refers to difficulty acquiring and retaining information across time. You also had difficulties on measures involving reasoning with verbal and visual information. In the context of your clinical history, these findings support the diagnosis of a dementia level problem.   Dementia refers to a group of syndromes where multiple areas of ability are damaged in the brain, such as memory, thinking, judgment, and behavior, and most commonly refers to age related causes of dementia that cause worsening in these abilities over time. Alzheimer's disease is the most common form of dementia in people over the age of 18. Not all dementias are Alzheimer's disease, but all Alzheimer's disease is dementia. When dementia is due to an underlying condition affecting the brain, such as Alzheimer's disease, there is progression over time, which typically proceeds gradually over many years.   In your case, I have a high level of confidence that your dementia is due to an underlying degenerative condition. Alzheimer's is the best known and most common neurodegenerative cause of dementia and I think that is the most likely cause in your case, but there is a differential and might be an outside consideration of frontotemporal lobar degeneration or a so-called "behavioral" variant Alzheimer's disease given that you do have significant behavior changes. I do not think poorly controlled diabetes, lack of sleep, depression, or  any other condition short of a significant organic brain problem would explain your test data and your clinical history. This means that there will likely be progression of your condition over time.   First, you should follow through with the MRI that Dr. Elease Hashimoto has ordered for you. This is important to enhance our diagnostic confidence and to rule out other potential causes. I would also like to assess any cerebrovascular changes, because those can contribute to cognitive impairment and you have risk factors for cerebrovascular disease (e.g., diabetes, high blood pressure). These types of changes are important to identify because they can often be slowed or stopped with assertive treatment of underlying vascular risk factors.   Second, the mainstay of treatment for individual with dementia is providing assistance with activities as needed. This is challenging if the affected individual does not wish to accept help because they do not think there is a problem. Often times, individuals with dementia are the last to be aware of their problems. The medical term for this lack of insight is called "anosognosia," which means absence of knowledge of illness. While their failure to recognize what seems obvious to everyone else can be frustrating, it is not their fault and is part of the disease itself. Trying to point out problems to the unaware individual is usually not helpful and is frequently harmful. What is helpful is to provide reassurance, redirect them, and avoid confronting them directly with the cognitive difficulties they are experiencing.  It is particularly important for individuals with anosognosia to get help planning for medical and other needs because they often  are unaware of what their needs are.   Decision making is a complex ability and it is challenging to evaluate. It is generally accepted that there are four primary components of decision making including: 1). The ability to consistently  express a preference 2). The ability to understand information pertinent to the decision at hand, including risks and benefits.  3). The ability to reason with that information in accordance with ones own values and circumstances 4). Appreciation for the consequences of the decision as applied to an individuals own unique circumstances.   While I would ultimately defer to Dr. Elease Hashimoto because he is more familiar with your values and preferences, I do have concerns about your capacity to make medical and financial decisions of high risk. It sounds as though you have a history of questionable financial judgment that has resulted in personal hardship. You also have objective cognitive impairments in reasoning and other skills that are particularly important for sound decision making. I do not think that you likely have appreciation of the level of help that is required, which makes it difficult to apply any understanding and reasoning to your own personal circumstances. Finally, individuals cannot advocate for their needs or get the help they require if they are unaware of their problem, and I do not think you have sufficient awareness.  Your family may wish to consult with an elder attorney. Consultation with an elder care attorney can help you protect your estate and communicate your preferences to your loved ones formally. An elder lawyer can also help guide you through the guardianship process if it is needed. I can provide my strong recommendation for the [employer], who are located at [address] in [place], [place]. They can be reached at [phone]. They also have a website [email or web address].   In the event that you do not wish to accept your family's help with financial and other important matters, I think it is likely appropriate for your family to proceed with guardianship. Again, I would defer to Dr. Elease Hashimoto if he feels differently, because he knows your values,  preferences, and is in a better position to guide you through that process as an ongoing care provider if it is appropriate.   Dementia is (somewhat arbitrarily) divided up into three different stages: Mild, Moderate, and Severe. These stages are not so much discrete stages as they are points on a continuum of severity. These stages govern the types of behavior you can expect, the level of care someone needs, and necessary supports. In the mild stages, difficulties are typically limited to cognitive problems, such as forgetfulness or difficulties coming up with the right word. Individuals are often able to remain at home, safely operate a motor vehicle, and changes may not be obvious to the casual observer. They may have difficulties with more complex things such as managing finances, with driving, and with work if they are still working. These individuals are often capable of living independently with minimal assistance, but do require some supervision with more complex tasks such as managing finances, arranging and remembering to take medications, and reporting symptoms at doctors appointments.   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 By definition, individuals with dementia have cognitive impairments that interfere with one or more daily (typically cognitively complex) activities. Practicing law is a very cognitively complex ability and I am concerned that it is no longer a good idea for you to practice due to your diagnosis and  the cognitive impairments you demonstrated on testing. My recommendation would be that you retire.   I suggest that your family actively monitor your medication compliance because often times, individuals with dementia have a hard time managing a medication regimen. You have had a rising A1c and clearly would like to get it under better control, and I am concerned some of that may be because of difficulty consistently taking medications. You may also need help with  nutritious meal planning, because premade food is typically not optimally nutritive and can contain large quantities of salt, sugar, carbohydrates, and other things it is better to avoid. [facility] Elyse Hsu at [facility] had previously wanted you to have a visit with diabetes education and I think that may be helpful. It will be crucially important for your family to be involved in that visit because I think you will have a hard time retaining the information discussed due to your memory problems.   Finally, you could return for reevaluation in 1 to 2 years, to follow your progress over time and make sure nothing is being missed.   Test Findings  Test scores are summarized in additional documentation associated with this encounter. Test scores are relative to age, gender, and educational history as available and appropriate. There were no concerns about performance validity as the vast majority of findings fell within normal expectations. One embedded indicator was below expectations, although this is likely to reflect genuine cognitive impairment in his case. The data are therefore interpreted with confidence.   General Intellectual Functioning/Achievement:  Performance on single word reading was high average, which may be the best indicator of premorbid ability. He performed at an average level on the RIST index although it is possible that reflects some acquired decline, as high average or better functioning is predicted given his level of educational and occupational achievement. Comparable average range performance was demonstrated on both the verbally and visually mediated RIST subtests.   Attention and Processing Efficiency: Performance on indicators of attention and processing efficiency was reasonable with average digit repetition forward and superior digit repetition backward. Timed number-symbol coding was weaker, but still fell at a reasonable low average level. Simply numeric sequencing was low average  as well.   Language: Performance on language measures showed marginal visual object confrontation naming, which may represent some decline for this individual with presumably well developed verbal abilities. Generation of words was high average in response to the letters F-A-S whereas generation of animals in one minute was unusually low.   Visuospatial Function: Performance on visuospatial and constructional measures was at the margin of the average and high average ranges. Figure copy was average and judgment of angular line orientations was very good, with an errorless high average score.   Learning and Memory: Performance on measures of learning and memory showed significant, advanced, storage type problems with diminished acquisition of information and then virtually no retention of information across time. He had difficulty with both verbal and visually mediated measures.   In the verbal realm, he learned 5, 6, and 6 words of a 12-item word list across three learning trials, which is low average. He only recalled 4 words on short delayed recall, which is unusually low, and then 0 words on long delayed recall, which is extremely low. Recognition cuing did not benefit his performance as his discriminability for words from the list versus false choices was extremely low (more false positives than correct identifications). Memory for a short story was extremely low on immediate recall  and extremely low on delayed recall with no information recalled. Memory for brief daily-living type information consisting of medication instructions and a name, address, and phone number was unusually low. Delayed recall was extremely low with virtually no information retained. In this case he did do better with recognition cuing with a low average recognition score.   In the visual realm, delayed recall for a modestly complex geometric figure was extremely low (did not recall any figure details).   Executive  Functions: Performance on executive measures showed variable scores with an overall impression of some impairment. He had a very hard time with the [employer], which generatied an extremely low Executive Function Composite score. His categories completed score was extremely low and his perseverative errors score was unusually low. He had difficulty reasoning with verbal information on the Complex [employer]ational Material with an extremely low score. By contrast, clock drawing was better with good face, number placement, yet no size difference between hands. Generation of words in response to the letters F-A-S was high average. He also did adequately with a low average score on alternating sequencing of numbers and letters of the alphabet.   Rating Scale(s): Mr. Collymore screened negative for the presence of depression. His daughter rated him as functioning at an MCI level on severity status staging with the QDRS. I was able to rate a CDR for him, and his global score is 0.5, his Sum of Boxes is 5.5. I think this may be an underestimate because we were not able to obtain detailed information about his functioning in certain areas, such as functioning in the community or at work or with driving, which are not observed by his family members.   Viviano Simas Nicole Kindred, PsyD, ABN Clinical Neuropsychologist  Coding and Compliance  Billing below reflects technician time, my direct face-to-face time with the patient, time spent in test administration, and time spent in professional activities including but not limited to: neuropsychological test interpretation, integration of neuropsychological test data with clinical history, report preparation, treatment planning, care coordination, and review of diagnostically pertinent medical history or studies.   Services associated with this encounter: Clinical Interview [id number]) plus 170 minutes ([id number]; Neuropsychological Evaluation by Professional)   120 minutes (96138/96139; Neuropsychological Testing by Technician)

## 2020-10-23 ENCOUNTER — Ambulatory Visit
Admission: RE | Admit: 2020-10-23 | Discharge: 2020-10-23 | Disposition: A | Discharge status: Home / Self Care | Payer: Medicare HMO | Source: Ambulatory Visit | Attending: Family Medicine | Admitting: Family Medicine

## 2020-10-23 ENCOUNTER — Other Ambulatory Visit: Payer: Self-pay

## 2020-10-23 DIAGNOSIS — R4689 Other symptoms and signs involving appearance and behavior: Secondary | ICD-10-CM

## 2020-10-23 DIAGNOSIS — R413 Other amnesia: Secondary | ICD-10-CM | POA: Diagnosis not present

## 2020-10-23 DIAGNOSIS — R9082 White matter disease, unspecified: Secondary | ICD-10-CM | POA: Diagnosis not present

## 2020-10-23 DIAGNOSIS — R4189 Other symptoms and signs involving cognitive functions and awareness: Secondary | ICD-10-CM

## 2020-10-26 ENCOUNTER — Other Ambulatory Visit: Payer: Self-pay

## 2020-10-26 ENCOUNTER — Encounter: Payer: Self-pay | Admitting: Counselor

## 2020-10-26 ENCOUNTER — Telehealth: Payer: Self-pay | Admitting: Family Medicine

## 2020-10-26 ENCOUNTER — Ambulatory Visit: Payer: Medicare HMO | Admitting: Counselor

## 2020-10-26 DIAGNOSIS — G301 Alzheimer's disease with late onset: Secondary | ICD-10-CM | POA: Diagnosis not present

## 2020-10-26 DIAGNOSIS — F0281 Dementia in other diseases classified elsewhere with behavioral disturbance: Secondary | ICD-10-CM

## 2020-10-26 NOTE — Progress Notes (Signed)
NEUROPSYCHOLOGY FEEDBACK NOTE [facility]  Feedback Note: I met with Alex Meyers to review the findings resulting from his neuropsychological evaluation. Since the last appointment, he has been about the same. He presented with his sister, Dr. Kenna Gilbert who is a retired Lexicographer. Time was spent reviewing the impressions and recommendations that are detailed in the evaluation report. We discussed impression of Alzheimer's dementia, as reflected in the patient instructions. I was candid with Alex Meyers and his sister that I am concerned about his ability to self-manage finances and medical affairs and I am also concerned that guardianship may be required to keep him from exercising poor judgment. We discussed a consultation with elder law. 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 I provided them with multiple resources including home health and Senior Line as well as handouts from [place] and [place] on managing dementia behaviors. I took time to explain the findings and answer all the patient's questions. I encouraged Alex Meyers to contact me should he have any further questions or if further follow up is desired.   Current Medications and Medical History   Current Outpatient Medications  Medication Sig Dispense Refill  . clopidogrel (PLAVIX) 75 MG tablet TAKE 1 TABLET(75 MG) BY MOUTH DAILY 90 tablet 3  . cyanocobalamin 1000 MCG tablet Take 2 tablets once a day    . Dapsone 5 % topical gel     . empagliflozin (JARDIANCE) 10 MG TABS tablet Take by mouth.    . Glucosamine HCl 1000 MG TABS Take 1,000 mg by mouth daily.    . meloxicam (MOBIC) 15 MG tablet Take 1 tablet (15 mg total) by mouth daily. 90 tablet 3  . metFORMIN (GLUCOPHAGE-XR) 500 MG 24 hr tablet Take 1 tablet by mouth at bedtime.  12  . pioglitazone (ACTOS) 45 MG tablet Take 45 mg by mouth daily.  11  . rosuvastatin (CRESTOR) 20 MG tablet Take 1 tablet (20 mg total) by mouth  daily. 30 tablet 12  . sitaGLIPtin (JANUVIA) 100 MG tablet Take 1 tablet by mouth once a day for diabetes     No current facility-administered medications for this visit.    Patient Active Problem List   Diagnosis Date Noted  . Tear of right supraspinatus tendon 10/14/2019  . Sore nose 10/12/2017  . B12 deficiency 05/11/2017  . Type 2 diabetes mellitus with pressure callus ([place]) 07/07/2015  . Tinnitus aurium 03/05/2015  . Internal hemorrhoids with Grade 2-3 prolapse and bleeding 09/25/2012  . Benign localized hyperplasia of prostate without urinary obstruction and other lower urinary tract symptoms (LUTS) 06/01/2011  . History of adenomatous polyp of colon 03/25/2011  . Routine general medical examination at a health care facility 12/28/2010  . ERECTILE DYSFUNCTION, ORGANIC 11/30/2009  . OSTEOARTHRITIS, GENERALIZED 11/27/2009  . CAD (coronary artery disease) 02/01/2009  . Diabetes mellitus with complication ([place]) 93/81/8299  . Hyperlipidemia with target LDL less than 70 01/31/2009  . THYROID NODULE 11/13/2006  . Essential hypertension 11/13/2006  . IBS 11/13/2006    Mental Status and Behavioral Observations  Alex Meyers presented on time to the present encounter and was alert and generally oriented. Speech was normal in rate, rhythm, volume, and prosody. Self-reported mood was "good" and affect was mainly euthymic. Thought process was circumstantial and thought content was often superfluous. He repeated himself multiple times within the encounter, forgetting what he had said only 10 minutes or so prior. This  was noticed by his sister as well who is clearly clued in about his situation. There were no safety concerns identified at today's encounter, such as thoughts of harming self or others.   Plan  Feedback provided regarding the patient's neuropsychological evaluation. Consultation with elder law recommended, he may need guardianship. I also recommended that he retire. Provided  resources including senior line, right at home, and didactic material on caregiving in [place]. I suggested they discuss medications with Dr. Elease Hashimoto. Alex Meyers was encouraged to contact me if any questions arise or if further follow up is desired.   Viviano Simas Nicole Kindred, PsyD, ABN Clinical Neuropsychologist  Service(s) Provided at This Encounter: 5 minutes [phone]; Psychotherapy with patient/family)

## 2020-10-26 NOTE — Telephone Encounter (Signed)
Spoke with sister Opal Sidles.  MRI results given. Patients sister wants to know is any information needed to inform Neuropsychologist at appointment this afternoon?

## 2020-10-26 NOTE — Telephone Encounter (Signed)
Wanting MRI results and anything else he wants to tell her about the neuro visit this afternoon that would be helpful.  Needs call back this morning.

## 2020-10-26 NOTE — Patient Instructions (Signed)
Your performance and presentation on assessment were consistent with cognitive changes, mainly involving memory and executive function. With regards to your memory problem, I believe it is a storage issue, which refers to difficulty acquiring and retaining information across time. You also had difficulties on measures involving reasoning with verbal and visual information. In the context of your clinical history, these findings support the diagnosis of a dementia level problem.   Dementia refers to a group of syndromes where multiple areas of ability are damaged in the brain, such as memory, thinking, judgment, and behavior, and most commonly refers to age related causes of dementia that cause worsening in these abilities over time. Alzheimer's disease is the most common form of dementia in people over the age of 24. Not all dementias are Alzheimer's disease, but all Alzheimer's disease is dementia. When dementia is due to an underlying condition affecting the brain, such as Alzheimer's disease, there is progression over time, which typically proceeds gradually over many years.   In your case, I have a high level of confidence that your dementia is due to an underlying degenerative condition. Alzheimer's is the best known and most common neurodegenerative cause of dementia and I think that is the most likely cause in your case, but there is a differential and might be an outside consideration of frontotemporal lobar degeneration or a so-called "behavioral" variant Alzheimer's disease given that you do have significant behavior changes. I do not think poorly controlled diabetes, lack of sleep, depression, or any other condition short of a significant organic brain problem would explain your test data and your clinical history. This means that there will likely be progression of your condition over time.   First, you should follow through with the MRI that Dr. Elease Hashimoto has ordered for you. This is important  to enhance our diagnostic confidence and to rule out other potential causes. I would also like to assess any cerebrovascular changes, because those can contribute to cognitive impairment and you have risk factors for cerebrovascular disease (e.g., diabetes, high blood pressure). These types of changes are important to identify because they can often be slowed or stopped with assertive treatment of underlying vascular risk factors.   Second, the mainstay of treatment for individual with dementia is providing assistance with activities as needed. This is challenging if the affected individual does not wish to accept help because they do not think there is a problem. Often times, individuals with dementia are the last to be aware of their problems. The medical term for this lack of insight is called "anosognosia," which means absence of knowledge of illness. While their failure to recognize what seems obvious to everyone else can be frustrating, it is not their fault and is part of the disease itself. Trying to point out problems to the unaware individual is usually not helpful and is frequently harmful. What is helpful is to provide reassurance, redirect them, and avoid confronting them directly with the cognitive difficulties they are experiencing.  It is particularly important for individuals with anosognosia to get help planning for medical and other needs because they often are unaware of what their needs are.   Decision making is a complex ability and it is challenging to evaluate. It is generally accepted that there are four primary components of decision making including: 1). The ability to consistently express a preference 2). The ability to understand information pertinent to the decision at hand, including risks and benefits.  3). The ability to reason with that information  in accordance with ones own values and circumstances 4). Appreciation for the consequences of the decision as applied to an  individuals own unique circumstances.   While I would ultimately defer to Dr. Elease Hashimoto because he is more familiar with your values and preferences, I do have concerns about your capacity to make medical and financial decisions of high risk. It sounds as though you have a history of questionable financial judgment that has resulted in personal hardship. You also have objective cognitive impairments in reasoning and other skills that are particularly important for sound decision making. I do not think that you likely have appreciation of the level of help that is required, which makes it difficult to apply any understanding and reasoning to your own personal circumstances. Finally, individuals cannot advocate for their needs or get the help they require if they are unaware of their problem, and I do not think you have sufficient awareness.  Your family may wish to consult with an elder attorney. Consultation with an elder care attorney can help you protect your estate and communicate your preferences to your loved ones formally. An elder lawyer can also help guide you through the guardianship process if it is needed. I can provide my strong recommendation for the [employer], who are located at [address] in [place], [place]. They can be reached at [phone]. They also have a website [email or web address].   In the event that you do not wish to accept your family's help with financial and other important matters, I think it is likely appropriate for your family to proceed with guardianship. Again, I would defer to Dr. Elease Hashimoto if he feels differently, because he knows your values, preferences, and is in a better position to guide you through that process as an ongoing care provider if it is appropriate.   Dementia is (somewhat arbitrarily) divided up into three different stages: Mild, Moderate, and Severe. These stages are not so much discrete stages as they are points on a continuum  of severity. These stages govern the types of behavior you can expect, the level of care someone needs, and necessary supports. In the mild stages, difficulties are typically limited to cognitive problems, such as forgetfulness or difficulties coming up with the right word. Individuals are often able to remain at home, safely operate a motor vehicle, and changes may not be obvious to the casual observer. They may have difficulties with more complex things such as managing finances, with driving, and with work if they are still working. These individuals are often capable of living independently with minimal assistance, but do require some supervision with more complex tasks such as managing finances, arranging and remembering to take medications, and reporting symptoms at doctors appointments.   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 By definition, individuals with dementia have cognitive impairments that interfere with one or more daily (typically cognitively complex) activities. 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 My recommendation would be that you retire.   I suggest that your family actively monitor your medication compliance because often times, individuals with dementia have a hard time managing a medication regimen. You have had a rising A1c and clearly would like to get it under better control, and I am concerned some of that may be because of difficulty  consistently taking medications. You may also need help with nutritious meal planning, because premade food is typically not optimally nutritive and can contain large quantities of salt, sugar, carbohydrates, and other things it is better to avoid. Dr. Elyse Hsu at [facility] had previously wanted you to have a visit with diabetes education and I think that may be helpful.  It will be crucially important for your family to be involved in that visit because I think you will have a hard time retaining the information discussed due to your memory problems.   Finally, you could return for reevaluation in 1 to 2 years, to follow your progress over time and make sure nothing is being missed.

## 2020-10-26 NOTE — Telephone Encounter (Signed)
Already spoken with patient's sister this morning.  We reviewed his MRI results.  We will probably consider starting him on Aricept but will wait until they have met with neuropsychologist first

## 2020-11-02 ENCOUNTER — Other Ambulatory Visit: Payer: Self-pay

## 2020-11-03 ENCOUNTER — Ambulatory Visit (INDEPENDENT_AMBULATORY_CARE_PROVIDER_SITE_OTHER): Payer: Medicare HMO | Admitting: Family Medicine

## 2020-11-03 ENCOUNTER — Encounter: Payer: Self-pay | Admitting: Family Medicine

## 2020-11-03 VITALS — BP 114/60 | HR 68 | Temp 97.6°F | Wt 158.7 lb

## 2020-11-03 DIAGNOSIS — H9193 Unspecified hearing loss, bilateral: Secondary | ICD-10-CM | POA: Diagnosis not present

## 2020-11-03 NOTE — Progress Notes (Signed)
Established Patient Office Visit  Subjective:  Patient ID: Alex Meyers, male    DOB: March 30, 1941  Age: 80 y.o. MRN: [id number]  CC:  Chief Complaint  Patient presents with  . Follow-up    Discuss medications and neuro exam    HPI Alex Meyers presents for follow-up and discussion regarding recent neuro psychological assessment.  His chronic problems include history of CAD, hypertension, type 2 diabetes, B12 deficiency.  He is followed by [facility] endocrinology and diabetes has been reasonably well controlled.  Family including daughters and sister who accompanies him today have had recent concerns regarding cognitive performance.  We had obtained follow-up labs including TSH, B12, RPR which were unremarkable.  MRI of the brain showed some age-related cerebral atrophy but no reversible findings. His neuropsychological assessment was concerning for cognitive impairment with specific concerns for early his Alzheimer's dementia.  There were concerns about his ability to self manage finances and medical affairs and suggestion that guardianship might be required to reduce risk of him exercising poor judgment.  Nivaan did agree to getting help in this area with medical power of attorney and durable power attorney.  His sister-Alex Meyers, who is a retired Lexicographer, has taken some leadership in this area.  His 3 daughters also been very attentive to his problems.  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  They state that his financial affairs are "in disarray ".  He has had some debt incurred and bills have not been paid.  Family is also taking great care to try to help make sure he is taking medications appropriately.  He currently lives in [facility] and they would like to keep him there as long as possible.  We have planned to consider initiation of medication such as Aricept today but  patient is adamant that he would like to get a second opinion.  He is not convinced that the testing which occurred on April 18 was accurate.  He states he was very tired and fatigued that day.  Family would also like to discuss possibility of audiology referral.  He has not had his hearing tested previously.  There have been some concerns regarding his ability to hear accurately.  There is apparently no significant evidence for depression on recent testing.  Past Medical History:  Diagnosis Date  . Coronary artery disease    s//p DES LAD and Dx1 01/2009  . Diabetes mellitus   . Diverticulosis   . Hx of colonic polyps 08/2010   Dr. Hilarie Fredrickson  . Hyperlipidemia    pt denies  . Hypertension    pt. denies  . Internal hemorrhoids with Grade 2-3 prolapse and bleeding 09/25/2012   Diagnosed by Dr. Linda Hedges via anoscopy   . Multiple thyroid nodules   . Personal history of colonic polyps 08/09/2002   ADENOMATOUS POLYP  . Tubular adenoma of colon     Past Surgical History:  Procedure Laterality Date  . COLONOSCOPY  multiple  . CORONARY STENT PLACEMENT  01/22/2009   right side  . WRIST SURGERY     right side    Family History  Problem Relation Age of Onset  . Heart failure Mother   . Coronary artery disease Mother   . Bladder Cancer Father 28  . Breast cancer Daughter   . Colon cancer Neg Hx     Social History   Socioeconomic History  .  Marital status: Divorced    Spouse name: Not on file  . Number of children: 3  . Years of education: Not on file  . Highest education level: Not on file  Occupational History  . Occupation: LAWYER    Employer: [employer]  Tobacco Use  . Smoking status: Never Smoker  . Smokeless tobacco: Never Used  Vaping Use  . Vaping Use: Never used  Substance and Sexual Activity  . Alcohol use: Yes    Alcohol/week: 4.0 standard drinks    Types: 4 Cans of beer per week    Comment: weekly  . Drug use: No  . Sexual activity: Not Currently  Other  Topics Concern  . Not on file  Social History Narrative   [name], Law school - [employer] . Married '78 the patient is divorced after 42 years of marriage. Has 3 daughters. He is an active attorney, formerly with [employer] but he has recently ('12) gone out on his own. Serially monogamous, does not always practice safe sex.    Social Determinants of Health   Financial Resource Strain: Low Risk   . Difficulty of Paying Living Expenses: Not hard at all  Food Insecurity: No Food Insecurity  . Worried About Charity fundraiser in the Last Year: Never true  . Ran Out of Food in the Last Year: Never true  Transportation Needs: No Transportation Needs  . Lack of Transportation (Medical): No  . Lack of Transportation (Non-Medical): No  Physical Activity: Sufficiently Active  . Days of Exercise per Week: 5 days  . Minutes of Exercise per Session: 110 min  Stress: No Stress Concern Present  . Feeling of Stress : Not at all  Social Connections: Socially Isolated  . Frequency of Communication with Friends and Family: More than three times a week  . Frequency of Social Gatherings with Friends and Family: More than three times a week  . Attends Religious Services: Never  . Active Member of Clubs or Organizations: No  . Attends Archivist Meetings: Never  . Marital Status: Divorced  Human resources officer Violence: Not At Risk  . Fear of Current or Ex-Partner: No  . Emotionally Abused: No  . Physically Abused: No  . Sexually Abused: No    Outpatient Medications Prior to Visit  Medication Sig Dispense Refill  . clopidogrel (PLAVIX) 75 MG tablet TAKE 1 TABLET(75 MG) BY MOUTH DAILY 90 tablet 3  . cyanocobalamin 1000 MCG tablet Take 2 tablets once a day    . Dapsone 5 % topical gel     . empagliflozin (JARDIANCE) 10 MG TABS tablet Take by mouth.    . Glucosamine HCl 1000 MG TABS Take 1,000 mg by mouth daily.    . meloxicam (MOBIC) 15 MG tablet Take 1 tablet (15 mg total) by mouth  daily. 90 tablet 3  . metFORMIN (GLUCOPHAGE-XR) 500 MG 24 hr tablet Take 2 tablets by mouth 2 (two) times daily. Take two tablets by mouth twice daily  12  . pioglitazone (ACTOS) 45 MG tablet Take 45 mg by mouth daily.  11  . rosuvastatin (CRESTOR) 20 MG tablet Take 1 tablet (20 mg total) by mouth daily. 30 tablet 12  . sitaGLIPtin (JANUVIA) 100 MG tablet Take 1 tablet by mouth once a day for diabetes     No facility-administered medications prior to visit.    No Known Allergies  ROS Review of Systems  Constitutional: Negative for appetite change and unexpected weight change.  Respiratory: Negative for cough.   Cardiovascular: Negative for chest pain.  Gastrointestinal: Negative for abdominal pain.  Neurological: Negative for headaches.      Objective:    Physical Exam Vitals reviewed.  Constitutional:      Appearance: Normal appearance.  HENT:     Ears:     Comments: He does have some cerumen in both canals particularly the left.  This was fairly superficial and we were able to use the ear curette and removed without difficulty.  Minimal cerumen right canal Cardiovascular:     Rate and Rhythm: Normal rate and regular rhythm.  Pulmonary:     Effort: Pulmonary effort is normal.     Breath sounds: Normal breath sounds.  Neurological:     Mental Status: He is alert.     BP 114/60 (BP Location: Left Arm, Patient Position: Sitting, Cuff Size: Normal)   Pulse 68   Temp 97.6 F (36.4 C) (Oral)   Wt 158 lb 11.2 oz (72 kg)   SpO2 97%   BMI 21.83 kg/m  Wt Readings from Last 3 Encounters:  11/03/20 158 lb 11.2 oz (72 kg)  10/06/20 155 lb 1.6 oz (70.4 kg)  08/24/20 163 lb (73.9 kg)     Health Maintenance Due  Topic Date Due  . Hepatitis C Screening  Never done  . URINE MICROALBUMIN  04/06/2018  . FOOT EXAM  06/15/2018  . HEMOGLOBIN A1C  05/29/2020  . OPHTHALMOLOGY EXAM  06/17/2020    There are no preventive care reminders to display for this patient.  Lab Results   Component Value Date   TSH 1.60 10/06/2020   Lab Results  Component Value Date   WBC 4.4 05/24/2016   HGB 6.5 (A) 04/06/2017   HCT 39.4 05/24/2016   MCV 87.7 05/24/2016   PLT 120.0 (L) 05/24/2016   Lab Results  Component Value Date   NA 141 11/27/2019   K 4.5 11/27/2019   CO2 28 (A) 11/27/2019   GLUCOSE 131 (H) 05/24/2016   BUN 20 11/27/2019   CREATININE 0.9 11/27/2019   BILITOT 1.0 05/24/2016   ALKPHOS 33 11/27/2019   AST 21 11/27/2019   ALT 13 11/27/2019   PROT 5.7 06/24/2017   ALBUMIN 4.0 11/27/2019   CALCIUM 9.7 11/27/2019   ANIONGAP 8 06/24/2017   GFR 76.88 05/24/2016   Lab Results  Component Value Date   CHOL 134 11/27/2019   Lab Results  Component Value Date   HDL 66 11/27/2019   Lab Results  Component Value Date   LDLCALC 54 11/27/2019   Lab Results  Component Value Date   TRIG 77 11/27/2019   Lab Results  Component Value Date   CHOLHDL 2 05/24/2016   Lab Results  Component Value Date   HGBA1C 6.8 11/27/2019      Assessment & Plan:    Cognitive impairment with concern for probable early Alzheimer's dementia-based on recent extensive neurocognitive testing.  Patient specifically requesting second opinion regarding neuro psychological testing.  We had initially planned to start low-dose Aricept based on recent assessment but discussed the fact that if they are to pursue a second opinion would hold on any medications at this point. -We did agree that audiology assessment seems reasonable to rule out any major issues there -We will look into possible referral to [facility] with family's efforts to help organize his medications and also for putting in place medical power of attorney and [place] to assist him with day-to-day  management decisions.  -30 minutes were spent reviewing current issues and discussing plans.     No orders of the defined types were placed in this encounter.   Follow-up: No follow-ups on file.     Carolann Littler, MD

## 2020-11-04 ENCOUNTER — Telehealth: Payer: Self-pay

## 2020-11-04 NOTE — Telephone Encounter (Signed)
GXT was ordered on 07-13-20 and test  was schedule for 09-15-20.  Patient no showed and was called on 3-29 to reschedule. Was unable to leave a message due to mailbox not set up.  Reach out again on 10/05/20 same results, and again 10/21/20.   On 10/21/20  a 10 day letter was mailed to the patient asking him to call and schedule test, if no respond to letter by 11/02/20 order will be cancel and Dr. Burt Knack notified.  Nurse is aware order is cancel.

## 2020-11-08 ENCOUNTER — Encounter: Payer: Self-pay | Admitting: Family Medicine

## 2020-11-08 DIAGNOSIS — R4189 Other symptoms and signs involving cognitive functions and awareness: Secondary | ICD-10-CM

## 2020-11-09 NOTE — Telephone Encounter (Signed)
Referrals have been made to neuropsychology appointment at [facility] as well as for Education officer, museum

## 2020-11-10 ENCOUNTER — Telehealth: Payer: Self-pay

## 2020-11-10 ENCOUNTER — Encounter: Payer: Self-pay | Admitting: Family Medicine

## 2020-11-10 ENCOUNTER — Other Ambulatory Visit: Payer: Self-pay

## 2020-11-10 DIAGNOSIS — F0280 Dementia in other diseases classified elsewhere without behavioral disturbance: Secondary | ICD-10-CM

## 2020-11-10 DIAGNOSIS — G309 Alzheimer's disease, unspecified: Secondary | ICD-10-CM

## 2020-11-10 NOTE — Telephone Encounter (Signed)
Spoke with Neoma Laming again. She was able to send this referral over to [facility] so that they can assist the patient. Nothing further needed.

## 2020-11-10 NOTE — Telephone Encounter (Signed)
A referral was placed for social work yesterday. I have been informed by Neoma Laming our referral coordinator that we are unable to place referral for social work alone and that the patient will have to set this up themself. Will contact the patient to make him aware.

## 2020-11-12 ENCOUNTER — Telehealth: Payer: Self-pay | Admitting: *Deleted

## 2020-11-12 NOTE — Chronic Care Management (AMB) (Signed)
  Chronic Care Management   Note  11/12/2020 Name: Alex Meyers MRN: [id number] DOB: 23-Nov-1940  Alex Meyers is a 80 y.o. year old male who is a primary care patient of Burchette, Alinda Sierras, MD. I reached out to [name] by phone today in response to a referral sent by [name]'s PCP, Eulas Post, MD.     [name] was given information about Chronic Care Management services today including:  1. CCM service includes personalized support from designated clinical staff supervised by his physician, including individualized plan of care and coordination with other care providers 2. 24/7 contact phone numbers for assistance for urgent and routine care needs. 3. Service will only be billed when office clinical staff spend 20 minutes or more in a month to coordinate care. 4. Only one practitioner may furnish and bill the service in a calendar month. 5. The patient may stop CCM services at any time (effective at the end of the month) by phone call to the office staff. 6. The patient will be responsible for cost sharing (co-pay) of up to 20% of the service fee (after annual deductible is met).  Confirmed by sister Imagene Gurney  DPR on file verbally agreed to assistance and services provided by embedded care coordination/care management team today.  Follow up plan: Telephone appointment with care management team member scheduled for: 11/20/2020  If patient returns call to provider office, please advise to call [facility] at [phone].  [facility]

## 2020-11-20 ENCOUNTER — Ambulatory Visit (INDEPENDENT_AMBULATORY_CARE_PROVIDER_SITE_OTHER): Payer: Medicare HMO | Admitting: *Deleted

## 2020-11-20 DIAGNOSIS — E11628 Type 2 diabetes mellitus with other skin complications: Secondary | ICD-10-CM | POA: Diagnosis not present

## 2020-11-20 DIAGNOSIS — I2510 Atherosclerotic heart disease of native coronary artery without angina pectoris: Secondary | ICD-10-CM | POA: Diagnosis not present

## 2020-11-20 DIAGNOSIS — L84 Corns and callosities: Secondary | ICD-10-CM

## 2020-11-20 DIAGNOSIS — R4189 Other symptoms and signs involving cognitive functions and awareness: Secondary | ICD-10-CM

## 2020-11-20 DIAGNOSIS — I10 Essential (primary) hypertension: Secondary | ICD-10-CM

## 2020-11-20 DIAGNOSIS — R4689 Other symptoms and signs involving appearance and behavior: Secondary | ICD-10-CM

## 2020-11-20 NOTE — Patient Instructions (Signed)
Visit Information   PATIENT GOALS:  Goals Addressed            This Visit's Progress   . Maintain My Qualify of Life by Receiving [place].   On track    Timeframe:  Long-Range Goal Priority:  High Start Date:  11/20/2020                     Expected End Date:  01/20/2021               Follow-Up Date:  11/27/2020 at 1:00pm.  . Review Personal Care Service information, application and agency provider list with daughter and obtain assistance with application completion and submission to [employer].  Notify LCSW is assistance is needed with the application process. . Review Aid and Attendance Benefits information and application with daughter and obtain assistance with application completion and submission to [employer].  Notify LCSW is assistance is needed with the application process. . Review Adult Medicaid tips and application with daughter and obtain assistance with application completion and submission to the [place].  Notify LCSW is assistance is needed with the application process. . Select 2 to 3 agencies, from the [place] agency provider list, that you would like to use, and keep this list until your assessment is completed by [employer] and you have been approved for services. . Work with [employer] on a weekly basis until approved for, [employer], Aid and Attendance Benefits and/or Adult Medicaid. . Review list of Adult Day Care Programs and consider self-enrollment for socialization. Remember: . Always use handrails on the stairs. . Always use your cane or walker when ambulating. . Always wear your glasses, especially at night or in areas not well lit.        Consent to CCM Services: Alex Meyers was given information about Chronic Care Management services today including:  1. CCM service includes personalized support from designated clinical staff  supervised by his physician, including individualized plan of care and coordination with other care providers 2. 24/7 contact phone numbers for assistance for urgent and routine care needs. 3. Service will only be billed when office clinical staff spend 20 minutes or more in a month to coordinate care. 4. Only one practitioner may furnish and bill the service in a calendar month. 5. The patient may stop CCM services at any time (effective at the end of the month) by phone call to the office staff. 6. The patient will be responsible for cost sharing (co-pay) of up to 20% of the service fee (after annual deductible is met).  Patient agreed to services and verbal consent obtained.   Patient verbalizes understanding of instructions provided today and agrees to view in [place].   Telephone follow up appointment with care management team member scheduled for:  11/27/2020 at 1:00pm.  [name] LCSW Licensed Clinical Social Worker [facility] [phone]  CLINICAL CARE PLAN: Patient Care Plan: LCSW Plan of Care    Problem Identified: Maintain My Qualify of Life by Receiving [place].   Priority: High    Long-Range Goal: Maintain My Qualify of Life by Receiving [place].   Start Date: 11/20/2020  Expected End Date: 01/20/2021  This Visit's Progress: On track  Priority: High  Note:   Current Barriers:   . Patient with Alzheimer's Disease, Coronary Artery Disease, Hypertension, Type II Diabetes Mellitus w/ Pressure Callus, Osteoarthritis and Tinnitus Aurium,  needs Support, Education, and Care Coordination to resolve unmet personal care needs in the home. . Patient is unable to self-administer medications as prescribed. . Patient is unable to consistently perform ADL's/IADL's independently. . Patient is unable to afford to pay for in-home care services out-of-pocket, due to recent money scam. . Patient presents level of care concerns living alone, now  requiring 24 hour care and supervision.   . Patient and daughter lack knowledge of available community agencies and resources. Clinical Goals:  . Over the next 45 to 60 days, patient will have in-home care services in place, either through [employer] and Attendance Benefits or through [employer].    . Patient and daughter will work with [place] and [employer], to coordinate care for Aid and Field seismologist. . Patient and daughter will select a Personal Care Service Provider, from the list mailed to them by LCSW. . Patient and daughter will receive an Aid and Attendance Benefits application, mailed to them by LCSW, and notify LCSW if they need assistance with application completion and submission. . Patient and daughter will receive a [place] application, mailed to them by LCSW, and notify LCSW if they need assistance with application completion and submission. . Patient and daughter will receive an Adult Medicaid application, mailed to them by LCSW, and notify LCSW if they need assistance with application completion and submission. . Patient will attend all scheduled medical appointments as evidenced by patient report and care team review of appointment completion in electronic medical record. . Patient will demonstrate improved health management independence as evidenced by having in-home care services in place. Clinical Interventions : . Patient, daughter, sister and brother-in-law were interviewed and appropriate assessments performed. Marland Kitchen Collaboration with patient's Primary Care Physician, Dr. Carolann Littler regarding development and update of comprehensive plan of care as evidenced by provider attestation and co-signature. Bertram Savin care team collaboration (see longitudinal plan of care). . Interventions performed:  Problem Solving/Task  Centered, Psychoeducation/Health Education, Quality of Sleep Assessed and Sleep Hygiene Techniques Promoted, Caregiver Stress Acknowledged and Consideration of In-Home Care Services Encouraged. . Provided patient and daughter with the following list of resources and applications: *[place] [id number] I REQUEST FOR INDEPENDENT ASSESSMENT FOR PERSONAL CARE SERVICES ATTESTATION OF MEDICAL NEED *[id number] REQUEST FOR INDEPENDENT ASSESSMENT FOR PERSONAL CARE SERVICES (PCS) ATTESTATION OF MEDICAL NEED *PERSONAL CARE SERVICE PROVIDERS *2021 MEDICAID TIPS *APPLICATION FOR [place]ID [place] OF HEALTH AND HUMAN SERVICES *ADULT DAY CARE CENTERS *EXAMINATION FOR HOUSEBOUND STATUS OR PERMANENT NEED FOR REGULAR AID AND ATTENDANCE *INFORMATION AND INSTRUCTIONS TO HELP YOU COMPLETE THE AUTHORIZATION TO DISCLOSE PERSONAL INFORMATION TO A THIRD PARTY *STATEMENT IN SUPPORT OF CLAIM *ADULT DAY CARE PROGRAMS . Discussed plans with patient and daughter for ongoing care management follow-up and provided direct contact information for care management team. . Collaborated with patient's Primary Care Physician, Dr. Carolann Littler regarding need for review and signature of [place] application and Aid and Attendance Benefits application. . Assisted patient and daughter with obtaining information about health plan benefits through [facility]. . Provided education to patient and daughter regarding level of care options. . Assessed needs, level of care concerns, basic eligibility and provided education on Personal Care Service process, Aid and Attendance Benefits process and Adult Medicaid application process. . [place] application and Aid and Attendance Benefits application will be faxed to [place], once completed and signed by patient's Primary  Care Physician, Dr. Carolann Littler. Marland Kitchen LCSW collaboration with [place] to verify application is received and processed. . Identified resources and durable medical equipment needed in the home to improve safety and promote independence. . Patient and daughter have been encouraged to apply for Adult Medicaid, through the [place]. . Provided list of Adult Day Care Programs and explained services and resources provided through attendance. Patient Goals/Self-Care Activities:  . Review Personal Care Service information, application and agency provider list with daughter and obtain assistance with application completion and submission to [employer].  Notify LCSW is assistance is needed with the application process. . Review Aid and Attendance Benefits information and application with daughter and obtain assistance with application completion and submission to [employer].  Notify LCSW is assistance is needed with the application process. . Review Adult Medicaid tips and application with daughter and obtain assistance with application completion and submission to the [place].  Notify LCSW is assistance is needed with the application process. . Select 2 to 3 agencies, from the [place] agency provider list, that you would like to use, and keep this list until your assessment is completed by [employer] and you have been approved for services. . Work with [employer] on a weekly basis until approved for, [employer], Aid and Attendance Benefits and/or Adult Medicaid. . Review list of Adult Day Care Programs and consider self-enrollment for socialization. Remember: . Always use handrails on the stairs. . Always use your cane or walker when ambulating. . Always wear your glasses, especially at night or in areas not well lit.  Follow-Up:  11/27/2020 at 1:00pm.

## 2020-11-20 NOTE — Chronic Care Management (AMB) (Signed)
Chronic Care Management    Clinical Social Work Note  11/20/2020 Name: Alex Meyers MRN: [id number] DOB: [birth year]  Alex Meyers is a 80 y.o. year old male who is a primary care patient of Burchette, Alinda Sierras, MD. The CCM team was consulted to assist the patient with chronic disease management and/or care coordination needs related to: [employer], Level of Care Concerns, Caregiver Stress and Financial Difficulties related to a Recent Government social research officer.  Collaboration with patient, daughter - Norval Gable, sister - Kenna Gilbert, brother-in-law - Lajoyce Lauber for initial visit in response to provider referral for social work chronic care management and care coordination services.   Consent to Services:  The patient was given information about Chronic Care Management services, agreed to services, and gave verbal consent prior to initiation of services.  Please see initial visit note for detailed documentation.   Patient agreed to services and consent obtained.   Assessment: Review of patient past medical history, allergies, medications, and health status, including review of relevant consultants reports was performed today as part of a comprehensive evaluation and provision of chronic care management and care coordination services.     SDOH (Social Determinants of Health) assessments and interventions performed:  SDOH Interventions   Flowsheet Row Most Recent Value  SDOH Interventions   Food Insecurity Interventions Intervention Not Indicated  [Verified by daughter, Norval Gable  Financial Strain Interventions Intervention Not Indicated  [Verified by daughter, Norval Gable  Housing Interventions Intervention Not Indicated  [Verified by daughter, Lattie Haw [place]]  Intimate Partner Violence Interventions Intervention Not Indicated  [Verified by daughter, Norval Gable  Physical Activity Interventions Intervention Not Indicated  [Verified by daughter, Lattie Haw [place]]  Stress Interventions  Intervention Not Indicated, Offered [employer]  [Verified by daughter, Lattie Haw [place]]  Social Connections Interventions Intervention Not Indicated  [Verified by daughter, Lattie Haw [place]]  Transportation Interventions Intervention Not Indicated  [Verified by daughter, Lattie Haw [place]]       Advanced Directives Status: See Care Plan for related entries.  Advanced Directives (Living Will and Healthcare Power of Attorney Documents) in place.  CCM Care Plan  No Known Allergies  Outpatient Encounter Medications as of 11/20/2020  Medication Sig  . clopidogrel (PLAVIX) 75 MG tablet TAKE 1 TABLET(75 MG) BY MOUTH DAILY  . cyanocobalamin 1000 MCG tablet Take 2 tablets once a day  . Dapsone 5 % topical gel   . empagliflozin (JARDIANCE) 10 MG TABS tablet Take by mouth.  . Glucosamine HCl 1000 MG TABS Take 1,000 mg by mouth daily.  . meloxicam (MOBIC) 15 MG tablet Take 1 tablet (15 mg total) by mouth daily.  . metFORMIN (GLUCOPHAGE-XR) 500 MG 24 hr tablet Take 2 tablets by mouth 2 (two) times daily. Take two tablets by mouth twice daily  . pioglitazone (ACTOS) 45 MG tablet Take 45 mg by mouth daily.  . rosuvastatin (CRESTOR) 20 MG tablet Take 1 tablet (20 mg total) by mouth daily.  . sitaGLIPtin (JANUVIA) 100 MG tablet Take 1 tablet by mouth once a day for diabetes   No facility-administered encounter medications on file as of 11/20/2020.    Patient Active Problem List   Diagnosis Date Noted  . Tear of right supraspinatus tendon 10/14/2019  . Sore nose 10/12/2017  . B12 deficiency 05/11/2017  . Type 2 diabetes mellitus with pressure callus ([place]) 07/07/2015  . Tinnitus aurium 03/05/2015  . Internal hemorrhoids with Grade 2-3 prolapse and bleeding 09/25/2012  . Benign localized hyperplasia of prostate without urinary obstruction  and other lower urinary tract symptoms (LUTS) 06/01/2011  . History of adenomatous polyp of colon 03/25/2011  . Routine general medical examination at a  health care facility 12/28/2010  . ERECTILE DYSFUNCTION, ORGANIC 11/30/2009  . OSTEOARTHRITIS, GENERALIZED 11/27/2009  . CAD (coronary artery disease) 02/01/2009  . Diabetes mellitus with complication ([place]) 35/57/3220  . Hyperlipidemia with target LDL less than 70 01/31/2009  . THYROID NODULE 11/13/2006  . Essential hypertension 11/13/2006  . IBS 11/13/2006    Conditions to be addressed/monitored:  Alzheimer's Disease, Coronary Artery Disease, Hypertension, Type II Diabetes Mellitus w/ Pressure Callus, Osteoarthritis and Tinnitus Aurium. Financial Constraints Related to Recent Money Scam, Limited Social Support, Level of Care Concerns, ADL/IADL Limitations, Social Isolation, Limited Access to Caregiver, Cognitive Deficits and Memory Deficits.  Care Plan : LCSW Plan of Care  Updates made by Francis Gaines, LCSW since 11/20/2020 12:00 AM    Problem: Maintain My Qualify of Life by Receiving [place].   Priority: High    Long-Range Goal: Maintain My Qualify of Life by Receiving [place].   Start Date: 11/20/2020  Expected End Date: 01/20/2021  This Visit's Progress: On track  Priority: High  Note:   Current Barriers:   . Patient with Alzheimer's Disease, Coronary Artery Disease, Hypertension, Type II Diabetes Mellitus w/ Pressure Callus, Osteoarthritis and Tinnitus Aurium, needs Support, Education, and Care Coordination to resolve unmet personal care needs in the home. . Patient is unable to self-administer medications as prescribed. . Patient is unable to consistently perform ADL's/IADL's independently. . Patient is unable to afford to pay for in-home care services out-of-pocket, due to recent money scam. . Patient presents level of care concerns living alone, now requiring 24 hour care and supervision.   . Patient and daughter lack knowledge of available community agencies and resources. Clinical Goals:  . Over the next 45 to 60 days, patient will have  in-home care services in place, either through [employer] and Attendance Benefits or through [employer].    . Patient and daughter will work with [place] and [employer], to coordinate care for Aid and Field seismologist. . Patient and daughter will select a Personal Care Service Provider, from the list mailed to them by LCSW. . Patient and daughter will receive an Aid and Attendance Benefits application, mailed to them by LCSW, and notify LCSW if they need assistance with application completion and submission. . Patient and daughter will receive a [place] application, mailed to them by LCSW, and notify LCSW if they need assistance with application completion and submission. . Patient and daughter will receive an Adult Medicaid application, mailed to them by LCSW, and notify LCSW if they need assistance with application completion and submission. . Patient will attend all scheduled medical appointments as evidenced by patient report and care team review of appointment completion in electronic medical record. . Patient will demonstrate improved health management independence as evidenced by having in-home care services in place. Clinical Interventions : . Patient, daughter, sister and brother-in-law were interviewed and appropriate assessments performed. Marland Kitchen Collaboration with patient's Primary Care Physician, Dr. Carolann Littler regarding development and update of comprehensive plan of care as evidenced by provider attestation and co-signature. Bertram Savin care team collaboration (see longitudinal plan of care). . Interventions performed:  Problem Solving/Task Centered, Psychoeducation/Health Education, Quality of Sleep Assessed and Sleep Hygiene Techniques Promoted, Caregiver Stress Acknowledged and Consideration of In-Home Care Services  Encouraged. . Provided patient and daughter with the following list of resources and applications: *[place] [id number] I REQUEST FOR INDEPENDENT ASSESSMENT FOR PERSONAL CARE SERVICES ATTESTATION OF MEDICAL NEED *[id number] REQUEST FOR INDEPENDENT ASSESSMENT FOR PERSONAL CARE SERVICES (PCS) ATTESTATION OF MEDICAL NEED *PERSONAL CARE SERVICE PROVIDERS *2021 MEDICAID TIPS *APPLICATION FOR [place]ID [place] OF HEALTH AND HUMAN SERVICES *ADULT DAY CARE CENTERS *EXAMINATION FOR HOUSEBOUND STATUS OR PERMANENT NEED FOR REGULAR AID AND ATTENDANCE *INFORMATION AND INSTRUCTIONS TO HELP YOU COMPLETE THE AUTHORIZATION TO DISCLOSE PERSONAL INFORMATION TO A THIRD PARTY *STATEMENT IN SUPPORT OF CLAIM *ADULT DAY CARE PROGRAMS . Discussed plans with patient and daughter for ongoing care management follow-up and provided direct contact information for care management team. . Collaborated with patient's Primary Care Physician, Dr. Carolann Littler regarding need for review and signature of [place] application and Aid and Attendance Benefits application. . Assisted patient and daughter with obtaining information about health plan benefits through [facility]. . Provided education to patient and daughter regarding level of care options. . Assessed needs, level of care concerns, basic eligibility and provided education on Personal Care Service process, Aid and Attendance Benefits process and Adult Medicaid application process. . Personal Care Services application and Aid and Attendance Benefits application will be faxed to [place], once completed and signed by patient's Primary Care Physician, Dr. Carolann Littler. Marland Kitchen LCSW collaboration with [place] to verify application is received and processed. . Identified resources and durable medical equipment needed in the home to improve safety and promote  independence. . Patient and daughter have been encouraged to apply for Adult Medicaid, through the [place]. . Provided list of Adult Day Care Programs and explained services and resources provided through attendance. Patient Goals/Self-Care Activities:  . Review Personal Care Service information, application and agency provider list with daughter and obtain assistance with application completion and submission to [employer].  Notify LCSW is assistance is needed with the application process. . Review Aid and Attendance Benefits information and application with daughter and obtain assistance with application completion and submission to [employer].  Notify LCSW is assistance is needed with the application process. . Review Adult Medicaid tips and application with daughter and obtain assistance with application completion and submission to the [place].  Notify LCSW is assistance is needed with the application process. . Select 2 to 3 agencies, from the [place] agency provider list, that you would like to use, and keep this list until your assessment is completed by [employer] and you have been approved for services. . Work with [employer] on a weekly basis until approved for, [employer], Aid and Attendance Benefits and/or Adult Medicaid. . Review list of Adult Day Care Programs and consider self-enrollment for socialization. Remember: . Always use handrails on the stairs. . Always use your cane or walker when ambulating. . Always wear your glasses, especially at night or in areas not well lit.  Follow-Up:  11/27/2020 at 1:00pm.      Follow Up Plan: 11/27/2020 at 1:00pm.      [name] LCSW Licensed Clinical Social Worker [place] [phone]

## 2020-11-23 ENCOUNTER — Encounter: Payer: Self-pay | Admitting: Family Medicine

## 2020-11-23 DIAGNOSIS — H903 Sensorineural hearing loss, bilateral: Secondary | ICD-10-CM | POA: Diagnosis not present

## 2020-11-24 ENCOUNTER — Encounter: Payer: Self-pay | Admitting: Family Medicine

## 2020-11-24 ENCOUNTER — Other Ambulatory Visit: Payer: Self-pay | Admitting: Family Medicine

## 2020-11-24 MED ORDER — DONEPEZIL HCL 5 MG PO TABS: 5.0000 mg | ORAL_TABLET | Freq: Every day | ORAL | 0 refills | Status: DC

## 2020-11-24 NOTE — Progress Notes (Signed)
Discussed with pt's sister, Kenna Gilbert, going ahead and starting Aricept 5 mg po qd.   Will plan in one month if tolerating well to further titrate to 10 mg .

## 2020-11-27 ENCOUNTER — Ambulatory Visit: Payer: Medicare HMO | Admitting: *Deleted

## 2020-11-27 ENCOUNTER — Encounter: Payer: Self-pay | Admitting: Family Medicine

## 2020-11-27 DIAGNOSIS — I10 Essential (primary) hypertension: Secondary | ICD-10-CM

## 2020-11-27 DIAGNOSIS — R4189 Other symptoms and signs involving cognitive functions and awareness: Secondary | ICD-10-CM

## 2020-11-27 DIAGNOSIS — E041 Nontoxic single thyroid nodule: Secondary | ICD-10-CM | POA: Diagnosis not present

## 2020-11-27 DIAGNOSIS — E042 Nontoxic multinodular goiter: Secondary | ICD-10-CM | POA: Diagnosis not present

## 2020-11-27 DIAGNOSIS — G3184 Mild cognitive impairment, so stated: Secondary | ICD-10-CM

## 2020-11-27 DIAGNOSIS — R4689 Other symptoms and signs involving appearance and behavior: Secondary | ICD-10-CM

## 2020-11-27 DIAGNOSIS — L84 Corns and callosities: Secondary | ICD-10-CM

## 2020-11-27 DIAGNOSIS — E11628 Type 2 diabetes mellitus with other skin complications: Secondary | ICD-10-CM

## 2020-11-27 DIAGNOSIS — Z7984 Long term (current) use of oral hypoglycemic drugs: Secondary | ICD-10-CM | POA: Diagnosis not present

## 2020-11-27 DIAGNOSIS — E119 Type 2 diabetes mellitus without complications: Secondary | ICD-10-CM | POA: Diagnosis not present

## 2020-11-30 ENCOUNTER — Encounter: Payer: Self-pay | Admitting: Family Medicine

## 2020-11-30 NOTE — Chronic Care Management (AMB) (Signed)
Chronic Care Management    Clinical Social Work Note  11/30/2020 Name: Alex Meyers MRN: [id number] DOB: 09/21/1940  Alex Meyers is a 80 y.o. year old male who is a primary care patient of Burchette, Alinda Sierras, MD. The CCM team was consulted to assist the patient with chronic disease management and/or care coordination needs related to: [employer], Level of Care Concerns and Caregiver Stress in Patient with Alzheimer's Disease, Coronary Artery Disease, Hypertension, Type II Diabetes Mellitus w/ Pressure Callus, Osteoarthritis and Tinnitus [place], needs Support, Education, and Care Coordination to resolve unmet personal care needs in the home.    Engaged with patient and family by telephone for follow up visit in response to provider referral for social work chronic care management and care coordination services.   Consent to Services:  The patient was given information about Chronic Care Management services, agreed to services, and gave verbal consent prior to initiation of services.  Please see initial visit note for detailed documentation.   Patient agreed to services and consent obtained.   Assessment: Review of patient past medical history, allergies, medications, and health status, including review of relevant consultants reports was performed today as part of a comprehensive evaluation and provision of chronic care management and care coordination services.     SDOH (Social Determinants of Health) assessments and interventions performed:    Advanced Directives Status: Not addressed in this encounter.  CCM Care Plan  No Known Allergies  Outpatient Encounter Medications as of 11/27/2020  Medication Sig  . clopidogrel (PLAVIX) 75 MG tablet TAKE 1 TABLET(75 MG) BY MOUTH DAILY  . cyanocobalamin 1000 MCG tablet Take 2 tablets once a day  . Dapsone 5 % topical gel   . donepezil (ARICEPT) 5 MG tablet Take 1 tablet (5 mg total) by mouth at bedtime.  . empagliflozin  (JARDIANCE) 10 MG TABS tablet Take by mouth.  . Glucosamine HCl 1000 MG TABS Take 1,000 mg by mouth daily.  . meloxicam (MOBIC) 15 MG tablet Take 1 tablet (15 mg total) by mouth daily.  . metFORMIN (GLUCOPHAGE-XR) 500 MG 24 hr tablet Take 2 tablets by mouth 2 (two) times daily. Take two tablets by mouth twice daily  . pioglitazone (ACTOS) 45 MG tablet Take 45 mg by mouth daily.  . rosuvastatin (CRESTOR) 20 MG tablet Take 1 tablet (20 mg total) by mouth daily.  . sitaGLIPtin (JANUVIA) 100 MG tablet Take 1 tablet by mouth once a day for diabetes   No facility-administered encounter medications on file as of 11/27/2020.    Patient Active Problem List   Diagnosis Date Noted  . Tear of right supraspinatus tendon 10/14/2019  . Sore nose 10/12/2017  . B12 deficiency 05/11/2017  . Type 2 diabetes mellitus with pressure callus ([place]) 07/07/2015  . Tinnitus aurium 03/05/2015  . Internal hemorrhoids with Grade 2-3 prolapse and bleeding 09/25/2012  . Benign localized hyperplasia of prostate without urinary obstruction and other lower urinary tract symptoms (LUTS) 06/01/2011  . History of adenomatous polyp of colon 03/25/2011  . Routine general medical examination at a health care facility 12/28/2010  . ERECTILE DYSFUNCTION, ORGANIC 11/30/2009  . OSTEOARTHRITIS, GENERALIZED 11/27/2009  . CAD (coronary artery disease) 02/01/2009  . Diabetes mellitus with complication ([place]) 29/93/7169  . Hyperlipidemia with target LDL less than 70 01/31/2009  . THYROID NODULE 11/13/2006  . Essential hypertension 11/13/2006  . IBS 11/13/2006    Conditions to be addressed/monitored: [employer], Level of Care Concerns and Caregiver Stress in Patient with  Alzheimer's Disease, Coronary Artery Disease, Hypertension, Type II Diabetes Mellitus w/ Pressure Callus, Osteoarthritis and Tinnitus [place], needs Support, Education, and Care Coordination to resolve unmet personal care needs in the home.     Care Plan :  LCSW Plan of Care  Updates made by Francis Gaines, LCSW since 11/30/2020 12:00 AM    Problem: Maintain My Qualify of Life by Receiving [place].   Priority: High    Long-Range Goal: Maintain My Qualify of Life by Receiving [place].   Start Date: 11/20/2020  Expected End Date: 01/20/2021  This Visit's Progress: On track  Recent Progress: On track  Priority: High  Note:   Current Barriers:   . Patient with Alzheimer's Disease, Coronary Artery Disease, Hypertension, Type II Diabetes Mellitus w/ Pressure Callus, Osteoarthritis and Tinnitus [place], needs Support, Education, and Care Coordination to resolve unmet personal care needs in the home. . Patient is unable to self-administer medications as prescribed. . Patient is unable to consistently perform ADL's/IADL's independently. . Patient is unable to afford to pay for in-home care services out-of-pocket, due to recent money scam. . Patient presents level of care concerns living alone, now requiring 24 hour care and supervision.   . Patient and daughter lack knowledge of available community agencies and resources. Clinical Goals:  . Over the next 45 to 60 days, patient will have in-home care services in place, either through [employer] and Attendance Benefits or through [employer].    . 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 . Patient and daughter will select a Personal Care Service Provider, from the list mailed to them by LCSW. . Patient and daughter will receive an Aid and Attendance Benefits application, mailed to them by LCSW, and notify LCSW if they need assistance with application completion and submission. . Patient and daughter will receive a [place] application, mailed to them by LCSW,  and notify LCSW if they need assistance with application completion and submission. . Patient and daughter will receive an Adult Medicaid application, mailed to them by LCSW, and notify LCSW if they need assistance with application completion and submission. . Patient will attend all scheduled medical appointments as evidenced by patient report and care team review of appointment completion in electronic medical record. . Patient will demonstrate improved health management independence as evidenced by having in-home care services in place. Clinical Interventions: . Patient, daughter, sister and brother-in-law were interviewed and appropriate assessments performed. Marland Kitchen Collaboration with patient's Primary Care Physician, Dr. Carolann Littler regarding development and update of comprehensive plan of care as evidenced by provider attestation and co-signature. Bertram Savin care team collaboration (see longitudinal plan of care). . Interventions performed:  Problem Solving/Task Centered, Psychoeducation/Health Education, Quality of Sleep Assessed and Sleep Hygiene Techniques Promoted, Caregiver Stress Acknowledged and Consideration of In-Home Care Services Encouraged. . Provided patient and daughter with the following list of resources and applications: *[place] [id number] I REQUEST FOR INDEPENDENT ASSESSMENT FOR PERSONAL CARE SERVICES ATTESTATION OF MEDICAL NEED *[id number] REQUEST FOR INDEPENDENT ASSESSMENT FOR PERSONAL CARE SERVICES (PCS) ATTESTATION OF MEDICAL NEED *PERSONAL CARE SERVICE PROVIDERS *2021 MEDICAID TIPS *APPLICATION FOR [place]ICAID [place] AND HUMAN SERVICES *ADULT DAY CARE CENTERS *EXAMINATION FOR HOUSEBOUND STATUS OR PERMANENT NEED FOR REGULAR AID AND ATTENDANCE *INFORMATION AND INSTRUCTIONS TO HELP YOU COMPLETE THE AUTHORIZATION TO DISCLOSE PERSONAL INFORMATION TO A THIRD PARTY *  STATEMENT IN SUPPORT OF CLAIM *ADULT DAY CARE PROGRAMS . Discussed plans with patient  and daughter for ongoing care management follow-up and provided direct contact information for care management team. . Collaborated with patient's Primary Care Physician, Dr. Carolann Littler regarding need for review and signature of [place] application and Aid and Attendance Benefits application. . Assisted patient and daughter with obtaining information about health plan benefits through [facility]. . Provided education to patient and daughter regarding level of care options. . Assessed needs, level of care concerns, basic eligibility and provided education on Personal Care Service process, Aid and Attendance Benefits process and Adult Medicaid application process. . Personal Care Services application and Aid and Attendance Benefits application will be faxed to [employer], once completed and signed by patient's Primary Care Physician, Dr. Carolann Littler. Marland Kitchen LCSW collaboration with [place] to verify application is received and processed. . Identified resources and durable medical equipment needed in the home to improve safety and promote independence. . Patient and daughter have been encouraged to apply for Adult Medicaid, through the [place]. . Provided list of Adult Day Care Programs and explained services and resources provided through attendance. Patient Goals/Self-Care Activities:  . Review Personal Care Service information, application and agency provider list with daughter and obtain assistance with application completion and submission to [employer].  Notify LCSW if assistance is needed with the application process. . Review Aid and Attendance Benefits information and application with daughter and obtain assistance with application completion and submission to [employer].  Notify LCSW if assistance is needed  with the application process. . Review Adult Medicaid tips and application with daughter and obtain assistance with application completion and submission to the [place].  Notify LCSW if assistance is needed with the application process. . Select 2 to 3 agencies, from the [place] agency provider list, that you would like to use, and keep this list until your assessment is completed by [employer] and you have been approved for services. . Work with [employer] on a weekly basis until approved for, [employer], Aid and Attendance Benefits and/or Adult Medicaid. Marland Kitchen Performed successful outreach call w/ patient, patient's daughter's, Yitzhak, Awan and Jake Seats, patient's sister, Kenna Gilbert and brother-in-law, Lajoyce Lauber, via 3-way call, to collaborate about long-term care planning options for patient. . Patient and family will discuss driving concerns with patient's Primary Care Physician, Dr. Carolann Littler and determine whether or not it is safe for patient to continue to operate a motor vehicle. . 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 . Educated family members about symptoms associated with Alzheimer's/Dementia, what to expect, and provided them with techniques and suggestions for how to best manage his care in the home. . Remember: . Always use handrails on the stairs. . Always use your cane or walker when ambulating. . Always wear your glasses, especially at night or in areas not well lit.  Follow-Up:  12/08/2020 at 11:00am.      Follow Up Plan: 12/08/2020 at 11:00am.      [name] LCSW Licensed Clinical Social Worker [place] [phone]

## 2020-11-30 NOTE — Patient Instructions (Signed)
Visit Information  PATIENT GOALS: Goals Addressed            This Visit's Progress   . Maintain My Qualify of Life by Receiving [place].   On track    Timeframe:  Long-Range Goal Priority:  High Start Date:  11/20/2020                     Expected End Date:  01/20/2021               Follow-Up Date:  12/08/2020 at 11:00am.  Patient Goals/Self-Care Activities: . Review Personal Care Service information, application and agency provider list with daughter and obtain assistance with application completion and submission to [employer].  Notify LCSW if assistance is needed with the application process. . Review Aid and Attendance Benefits information and application with daughter and obtain assistance with application completion and submission to [employer].  Notify LCSW if assistance is needed with the application process. . Review Adult Medicaid tips and application with daughter and obtain assistance with application completion and submission to the [place].  Notify LCSW if assistance is needed with the application process. . Select 2 to 3 agencies, from the [place] agency provider list, that you would like to use, and keep this list until your assessment is completed by [employer] and you have been approved for services. . Work with [employer] on a weekly basis until approved for, [employer], Aid and Attendance Benefits and/or Adult Medicaid. Marland Kitchen Performed successful outreach call w/ patient, patient's daughter's, Jiovany, [place] and Jake Seats, patient's sister, Kenna Gilbert and brother-in-law, Lajoyce Lauber, via 3-way call, to collaborate about long-term care planning options for patient. . Patient and family will discuss driving concerns with patient's Primary Care Physician, Dr. Carolann Littler and determine whether or not it is safe for patient to continue  to operate a motor vehicle. . 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 . Educated family members about symptoms associated with Alzheimer's/Dementia, what to expect, and provided them with techniques and suggestions for how to best manage his care in the home. Remember: . Always use handrails on the stairs. . Always use your cane or walker when ambulating. . Always wear your glasses, especially at night or in areas not well lit.        Patient verbalizes understanding of instructions provided today and agrees to view in [place].   Telephone follow up appointment with care management team member scheduled for:  12/08/2020 at 11:00am.  [name] LCSW Licensed Clinical Social Worker [place] [phone]

## 2020-12-04 ENCOUNTER — Emergency Department (HOSPITAL_COMMUNITY): Payer: Medicare HMO

## 2020-12-04 ENCOUNTER — Encounter (HOSPITAL_COMMUNITY): Payer: Self-pay | Admitting: Emergency Medicine

## 2020-12-04 ENCOUNTER — Other Ambulatory Visit: Payer: Self-pay

## 2020-12-04 ENCOUNTER — Encounter: Payer: Self-pay | Admitting: Family Medicine

## 2020-12-04 ENCOUNTER — Emergency Department (HOSPITAL_COMMUNITY)
Admission: EM | Admit: 2020-12-04 | Discharge: 2020-12-05 | Disposition: A | Discharge status: Home / Self Care | Payer: Medicare HMO | Attending: Emergency Medicine | Admitting: Emergency Medicine

## 2020-12-04 DIAGNOSIS — E785 Hyperlipidemia, unspecified: Secondary | ICD-10-CM | POA: Insufficient documentation

## 2020-12-04 DIAGNOSIS — I10 Essential (primary) hypertension: Secondary | ICD-10-CM | POA: Insufficient documentation

## 2020-12-04 DIAGNOSIS — Z7902 Long term (current) use of antithrombotics/antiplatelets: Secondary | ICD-10-CM | POA: Diagnosis not present

## 2020-12-04 DIAGNOSIS — R0789 Other chest pain: Secondary | ICD-10-CM

## 2020-12-04 DIAGNOSIS — E1169 Type 2 diabetes mellitus with other specified complication: Secondary | ICD-10-CM | POA: Insufficient documentation

## 2020-12-04 DIAGNOSIS — R0781 Pleurodynia: Secondary | ICD-10-CM | POA: Diagnosis not present

## 2020-12-04 DIAGNOSIS — Y92002 Bathroom of unspecified non-institutional (private) residence single-family (private) house as the place of occurrence of the external cause: Secondary | ICD-10-CM | POA: Insufficient documentation

## 2020-12-04 DIAGNOSIS — Z79899 Other long term (current) drug therapy: Secondary | ICD-10-CM | POA: Insufficient documentation

## 2020-12-04 DIAGNOSIS — M549 Dorsalgia, unspecified: Secondary | ICD-10-CM | POA: Diagnosis not present

## 2020-12-04 DIAGNOSIS — W010XXA Fall on same level from slipping, tripping and stumbling without subsequent striking against object, initial encounter: Secondary | ICD-10-CM | POA: Insufficient documentation

## 2020-12-04 DIAGNOSIS — Y9301 Activity, walking, marching and hiking: Secondary | ICD-10-CM | POA: Diagnosis not present

## 2020-12-04 DIAGNOSIS — Z955 Presence of coronary angioplasty implant and graft: Secondary | ICD-10-CM | POA: Diagnosis not present

## 2020-12-04 DIAGNOSIS — I2510 Atherosclerotic heart disease of native coronary artery without angina pectoris: Secondary | ICD-10-CM | POA: Insufficient documentation

## 2020-12-04 LAB — BASIC METABOLIC PANEL
Anion gap: 9 (ref 5–15)
BUN: 16 mg/dL (ref 8–23)
CO2: 26 mmol/L (ref 22–32)
Calcium: 8.9 mg/dL (ref 8.9–10.3)
Chloride: 100 mmol/L (ref 98–111)
Creatinine, Ser: 0.96 mg/dL (ref 0.61–1.24)
GFR, Estimated: >60 mL/min (ref >60)
Glucose, Bld: 179 mg/dL — ABNORMAL HIGH (ref 70–99)
Potassium: 4 mmol/L (ref 3.5–5.1)
Sodium: 135 mmol/L (ref 135–145)

## 2020-12-04 LAB — CBC WITH DIFFERENTIAL/PLATELET
Abs Immature Granulocytes: 0.01 10*3/uL (ref 0.00–0.07)
Basophils Absolute: 0 10*3/uL (ref 0.0–0.1)
Basophils Relative: 1 %
Eosinophils Absolute: 0.1 10*3/uL (ref 0.0–0.5)
Eosinophils Relative: 2 %
HCT: 37.8 % — ABNORMAL LOW (ref 39.0–52.0)
Hemoglobin: 11.9 g/dL — ABNORMAL LOW (ref 13.0–17.0)
Immature Granulocytes: 0 %
Lymphocytes Relative: 29 %
Lymphs Abs: 1.1 10*3/uL (ref 0.7–4.0)
MCH: 29.7 pg (ref 26.0–34.0)
MCHC: 31.5 g/dL (ref 30.0–36.0)
MCV: 94.3 fL (ref 80.0–100.0)
Monocytes Absolute: 0.5 10*3/uL (ref 0.1–1.0)
Monocytes Relative: 13 %
Neutro Abs: 2.2 10*3/uL (ref 1.7–7.7)
Neutrophils Relative %: 55 %
Platelets: 148 10*3/uL — ABNORMAL LOW (ref 150–400)
RBC: 4.01 MIL/uL — ABNORMAL LOW (ref 4.22–5.81)
RDW: 15.3 % (ref 11.5–15.5)
WBC: 4 10*3/uL (ref 4.0–10.5)
nRBC: 0 % (ref 0.0–0.2)

## 2020-12-04 NOTE — ED Triage Notes (Signed)
Patient fell backwards at home 3 days ago reports left back pain and LUQ abdominal pain yesterday , denies LOC/ambulatory.

## 2020-12-04 NOTE — ED Notes (Signed)
PT decided to leave , PT stated he was told he would be next , PT stated that he was here for 4 hrs and is extremely hungry.

## 2020-12-04 NOTE — ED Provider Notes (Signed)
Emergency Medicine Provider Triage Evaluation Note  Alex Meyers , a 80 y.o. male  was evaluated in triage.  Pt complains of left-sided lower chest/rib pain after recent fall.  Denies any loss of consciousness.  States this is mechanical fall.  Initially had pain in his posterior ribs but is unsure if this migrated to the front.  No shortness of breath.  Review of Systems  Positive: Left-sided rib pain Negative: Loss of consciousness  Physical Exam  BP (!) 143/81 (BP Location: Left Arm)   Pulse 66   Temp 98.1 F (36.7 C)   Resp 16   Ht 5' 11.5" (1.816 m)   Wt 72.6 kg   SpO2 98%   BMI 22.00 kg/m  Gen:   Awake, no distress   Resp:  Normal effort  MSK:   Moves extremities without difficulty  Other:  L Upper quadrant tenderness  Medical Decision Making  Medically screening exam initiated at 7:57 PM.  Appropriate orders placed.  Alex Meyers was informed that the remainder of the evaluation will be completed by another provider, this initial triage assessment does not replace that evaluation, and the importance of remaining in the ED until their evaluation is complete.  Imaging and labs are ordered   Delia Heady, Hershal Coria 12/04/20 1959    Tegeler, Gwenyth Allegra, MD 12/04/20 2035

## 2020-12-05 LAB — URINALYSIS, ROUTINE W REFLEX MICROSCOPIC
Bacteria, UA: NONE SEEN
Bilirubin Urine: NEGATIVE
Glucose, UA: >=500 mg/dL — AB
Hgb urine dipstick: NEGATIVE
Ketones, ur: NEGATIVE mg/dL
Leukocytes,Ua: NEGATIVE
Nitrite: NEGATIVE
Protein, ur: NEGATIVE mg/dL
Specific Gravity, Urine: 1.026 (ref 1.005–1.030)
pH: 6 (ref 5.0–8.0)

## 2020-12-05 MED ORDER — ACETAMINOPHEN 500 MG PO TABS
1000.0000 mg | ORAL_TABLET | Freq: Once | ORAL | Status: AC
  Administered 2020-12-05: 1000 mg via ORAL
  Filled 2020-12-05: qty 2

## 2020-12-05 MED ORDER — OXYCODONE HCL 5 MG PO TABS
5.0000 mg | ORAL_TABLET | Freq: Once | ORAL | Status: AC
  Administered 2020-12-05: 5 mg via ORAL
  Filled 2020-12-05: qty 1

## 2020-12-05 MED ORDER — DICLOFENAC SODIUM 1 % EX GEL: 4.0000 g | Freq: Four times a day (QID) | CUTANEOUS | 0 refills | Status: AC

## 2020-12-05 MED ORDER — MORPHINE SULFATE 15 MG PO TABS: 7.5000 mg | ORAL_TABLET | ORAL | 0 refills | Status: DC | PRN

## 2020-12-05 NOTE — ED Notes (Signed)
Pt discharged and ambulated out of the ED without difficulty. 

## 2020-12-05 NOTE — ED Provider Notes (Signed)
[facility] EMERGENCY DEPARTMENT Provider Note   CSN: [id number] Arrival date & time: 12/04/20  1915     History Chief Complaint  Patient presents with  . Fall: Back and Abdominal pain     Alex Meyers is a 80 y.o. male.  80 yo M with a chief complaints of a fall.  This happened a couple days ago and the patient was walking to the bathroom when he slipped on something on the floor and fell.  He thinks he landed onto the left side of his upper back.  Complains of pain there for about a day and then felt like it moved to the anterior portion of the rib margin.  He denies any abdominal pain nausea or vomiting.  Denies head injury or loss of consciousness.  Denies any difficulty with breathing.  It is worse with twisting moving palpation.  The history is provided by the patient.  Injury This is a new problem. The current episode started 2 days ago. The problem occurs rarely. The problem has been gradually worsening. Pertinent negatives include no chest pain, no abdominal pain, no headaches and no shortness of breath. The symptoms are aggravated by bending and twisting. Nothing relieves the symptoms. He has tried nothing for the symptoms. The treatment provided no relief.       Past Medical History:  Diagnosis Date  . Coronary artery disease    s//p DES LAD and Dx1 01/2009  . Diabetes mellitus   . Diverticulosis   . Hx of colonic polyps 08/2010   Dr. Hilarie Fredrickson  . Hyperlipidemia    pt denies  . Hypertension    pt. denies  . Internal hemorrhoids with Grade 2-3 prolapse and bleeding 09/25/2012   Diagnosed by Dr. Linda Hedges via anoscopy   . Multiple thyroid nodules   . Personal history of colonic polyps 08/09/2002   ADENOMATOUS POLYP  . Tubular adenoma of colon     Patient Active Problem List   Diagnosis Date Noted  . Tear of right supraspinatus tendon 10/14/2019  . Sore nose 10/12/2017  . B12 deficiency 05/11/2017  . Type 2 diabetes mellitus with pressure callus  ([place]) 07/07/2015  . Tinnitus aurium 03/05/2015  . Internal hemorrhoids with Grade 2-3 prolapse and bleeding 09/25/2012  . Benign localized hyperplasia of prostate without urinary obstruction and other lower urinary tract symptoms (LUTS) 06/01/2011  . History of adenomatous polyp of colon 03/25/2011  . Routine general medical examination at a health care facility 12/28/2010  . ERECTILE DYSFUNCTION, ORGANIC 11/30/2009  . OSTEOARTHRITIS, GENERALIZED 11/27/2009  . CAD (coronary artery disease) 02/01/2009  . Diabetes mellitus with complication ([place]) 36/14/4315  . Hyperlipidemia with target LDL less than 70 01/31/2009  . THYROID NODULE 11/13/2006  . Essential hypertension 11/13/2006  . IBS 11/13/2006    Past Surgical History:  Procedure Laterality Date  . COLONOSCOPY  multiple  . CORONARY STENT PLACEMENT  01/22/2009   right side  . WRIST SURGERY     right side       Family History  Problem Relation Age of Onset  . Heart failure Mother   . Coronary artery disease Mother   . Bladder Cancer Father 52  . Breast cancer Daughter   . Colon cancer Neg Hx     Social History   Tobacco Use  . Smoking status: Never Smoker  . Smokeless tobacco: Never Used  Vaping Use  . Vaping Use: Never used  Substance Use Topics  . Alcohol use: Yes  Alcohol/week: 4.0 standard drinks    Types: 4 Cans of beer per week    Comment: weekly  . Drug use: No    Home Medications Prior to Admission medications   Medication Sig Start Date End Date Taking? Authorizing Provider  diclofenac Sodium (VOLTAREN) 1 % GEL Apply 4 g topically 4 (four) times daily. 12/05/20  Yes Deno Etienne, DO  morphine (MSIR) 15 MG tablet Take 0.5 tablets (7.5 mg total) by mouth every 4 (four) hours as needed for severe pain. 12/05/20  Yes Deno Etienne, DO  clopidogrel (PLAVIX) 75 MG tablet TAKE 1 TABLET(75 MG) BY MOUTH DAILY 07/21/20   Sherren Mocha, MD  cyanocobalamin 1000 MCG tablet Take 2 tablets once a day    [provider]  Dapsone 5 % topical gel  04/07/19   [provider]  donepezil (ARICEPT) 5 MG tablet Take 1 tablet (5 mg total) by mouth at bedtime. 11/24/20   Burchette, Alinda Sierras, MD  empagliflozin (JARDIANCE) 10 MG TABS tablet Take by mouth. 09/29/17   [provider]  Glucosamine HCl 1000 MG TABS Take 1,000 mg by mouth daily.    [provider]  meloxicam (MOBIC) 15 MG tablet Take 1 tablet (15 mg total) by mouth daily. 01/02/20   Hyatt, Max T, DPM  metFORMIN (GLUCOPHAGE-XR) 500 MG 24 hr tablet Take 2 tablets by mouth 2 (two) times daily. Take two tablets by mouth twice daily 06/06/14   [provider]  pioglitazone (ACTOS) 45 MG tablet Take 45 mg by mouth daily. 06/06/14   [provider]  rosuvastatin (CRESTOR) 20 MG tablet Take 1 tablet (20 mg total) by mouth daily. 04/22/11   Sherren Mocha, MD  sitaGLIPtin (JANUVIA) 100 MG tablet Take 1 tablet by mouth once a day for diabetes 09/13/17   [provider]    Allergies    Patient has no known allergies.  Review of Systems   Review of Systems  Constitutional: Negative for chills and fever.  HENT: Negative for congestion and facial swelling.   Eyes: Negative for discharge and visual disturbance.  Respiratory: Negative for shortness of breath.   Cardiovascular: Negative for chest pain and palpitations.  Gastrointestinal: Negative for abdominal pain, diarrhea and vomiting.  Musculoskeletal: Positive for arthralgias and myalgias.  Skin: Negative for color change and rash.  Neurological: Negative for tremors, syncope and headaches.  Psychiatric/Behavioral: Negative for confusion and dysphoric mood.    Physical Exam Updated Vital Signs BP 136/68   Pulse (!) 116   Temp 98.5 F (36.9 C) (Oral)   Resp 15   Ht 5\' 11"  (1.803 m)   Wt 80 kg   SpO2 91%   BMI 24.60 kg/m   Physical Exam Vitals and nursing note reviewed.  Constitutional:      Appearance: He is well-developed.  HENT:      Head: Normocephalic and atraumatic.  Eyes:     Pupils: Pupils are equal, round, and reactive to light.  Neck:     Vascular: No JVD.  Cardiovascular:     Rate and Rhythm: Normal rate and regular rhythm.     Heart sounds: No murmur heard. No friction rub. No gallop.   Pulmonary:     Effort: No respiratory distress.     Breath sounds: No wheezing.  Abdominal:     General: There is no distension.     Tenderness: There is no abdominal tenderness. There is no guarding or rebound.     Comments: Benign abdominal exam,  no pain to deep palpation of the left upper quadrant.  Pain along the anterior aspect of rib 11 and 12.  Musculoskeletal:        General: Normal range of motion.     Cervical back: Normal range of motion and neck supple.  Skin:    Coloration: Skin is not pale.     Findings: No rash.  Neurological:     Mental Status: He is alert and oriented to person, place, and time.  Psychiatric:        Behavior: Behavior normal.     ED Results / Procedures / Treatments   Labs (all labs ordered are listed, but only abnormal results are displayed) Labs Reviewed  BASIC METABOLIC PANEL - Abnormal; Notable for the following components:      Result Value   Glucose, Bld 179 (*)    All other components within normal limits  CBC WITH DIFFERENTIAL/PLATELET - Abnormal; Notable for the following components:   RBC 4.01 (*)    Hemoglobin 11.9 (*)    HCT 37.8 (*)    Platelets 148 (*)    All other components within normal limits  URINALYSIS, ROUTINE W REFLEX MICROSCOPIC - Abnormal; Notable for the following components:   Glucose, UA >=500 (*)    All other components within normal limits    EKG EKG Interpretation  Date/Time:  Friday December 04 2020 20:13:29 EDT Ventricular Rate:  58 PR Interval:  184 QRS Duration: 96 QT Interval:  454 QTC Calculation: 445 R Axis:   -83 Text Interpretation: Sinus bradycardia Left axis deviation Incomplete right bundle branch block Anteroseptal infarct ,  age undetermined Abnormal ECG No significant change since last tracing Confirmed by Deno Etienne [phone]) on 12/05/2020 1:50:19 AM   Radiology DG Ribs Unilateral W/Chest Left  Result Date: 12/04/2020 CLINICAL DATA:  Status post fall with subsequent left rib pain. EXAM: LEFT RIBS AND CHEST - 3+ VIEW COMPARISON:  None. FINDINGS: No acute fracture involving the left ribs. A very small, chronic appearing deformity is seen involving the lateral aspect of the ninth left rib. There is no evidence of pneumothorax or pleural effusion. Both lungs are clear. Heart size and mediastinal contours are within normal limits. IMPRESSION: No acute osseous abnormality. Electronically Signed   By: Virgina Norfolk M.D.   On: 12/04/2020 20:48    Procedures Procedures   Medications Ordered in ED Medications - No data to display  ED Course  I have reviewed the triage vital signs and the nursing notes.  Pertinent labs & imaging results that were available during my care of the patient were reviewed by me and considered in my medical decision making (see chart for details).    MDM Rules/Calculators/A&P                          80 yo M with a chief complaints of fall.  The patient was at home and he slipped on something on the floor and landed on his back.  Has been complaining of back pain for the past couple days.  Family brought him in for evaluation.  Likely musculoskeletal.  Plain film viewed by me without pneumothorax or obvious rib fracture.  Lab work is otherwise unremarkable.  There is some concern for urinary frequency so we will send off a UA.  UA is negative for infection.  Will discharge home.  PCP follow-up.  3:51 AM:  I have discussed the diagnosis/risks/treatment options with the patient and  family and believe the pt to be eligible for discharge home to follow-up with PCP. We also discussed returning to the ED immediately if new or worsening sx occur. We discussed the sx which are most concerning (e.g.,  sudden worsening pain, fever, inability to tolerate by mouth, sob) that necessitate immediate return. Medications administered to the patient during their visit and any new prescriptions provided to the patient are listed below.  Medications given during this visit Medications - No data to display   The patient appears reasonably screen and/or stabilized for discharge and I doubt any other medical condition or other [facility] requiring further screening, evaluation, or treatment in the ED at this time prior to discharge.   Final Clinical Impression(s) / ED Diagnoses Final diagnoses:  Left-sided chest wall pain    Rx / DC Orders ED Discharge Orders         Ordered    morphine (MSIR) 15 MG tablet  Every 4 hours PRN        12/05/20 0339    diclofenac Sodium (VOLTAREN) 1 % GEL  4 times daily        12/05/20 Newman, [place], DO 12/05/20 [phone]

## 2020-12-05 NOTE — Discharge Instructions (Addendum)
Your urine was negative for infection.   Even though your x-ray was negative for rib fracture we will treat you like maybe you have 1.  Please use the incentive spirometer 10 minutes out of every hour you are awake.  Please return for worsening pain or difficulty breathing or fever.  Please follow-up with your family doctor.  Use the gel as prescribed.  Take Tylenol 1000 mg 4 times a day this is 2X strength tablets.     Then take the pain medicine if you feel like you need it. Narcotics do not help with the pain, they only make you care about it less.  You can become addicted to this, people may break into your house to steal it.  It will constipate you.  If you drive under the influence of this medicine you can get a DUI.

## 2020-12-05 NOTE — ED Notes (Addendum)
Pt left the department and took off walking according to security footage, pt found safely at home by [employer], pt brought back to ED by family. Charge RN and [facility] to speak with family, pt to be roomed to next room

## 2020-12-05 NOTE — ED Notes (Signed)
Pt was given an incentive spirometer and instructed how to use it.

## 2020-12-06 NOTE — Telephone Encounter (Signed)
I spoke with Opal Sidles (his sister) on Friday.  They are trying to get him in to see someone at the [facility] over at [facility].   He has had some increased delusional type thinking.  I see him in office on 12-07-20 and can address with his daughter at that time.

## 2020-12-07 ENCOUNTER — Ambulatory Visit (INDEPENDENT_AMBULATORY_CARE_PROVIDER_SITE_OTHER): Payer: Medicare HMO | Admitting: Family Medicine

## 2020-12-07 ENCOUNTER — Other Ambulatory Visit: Payer: Self-pay

## 2020-12-07 ENCOUNTER — Encounter: Payer: Self-pay | Admitting: Family Medicine

## 2020-12-07 VITALS — BP 130/62 | HR 68 | Temp 98.0°F | Wt 160.0 lb

## 2020-12-07 DIAGNOSIS — G301 Alzheimer's disease with late onset: Secondary | ICD-10-CM | POA: Diagnosis not present

## 2020-12-07 DIAGNOSIS — R0781 Pleurodynia: Secondary | ICD-10-CM

## 2020-12-07 DIAGNOSIS — F0390 Unspecified dementia without behavioral disturbance: Secondary | ICD-10-CM | POA: Insufficient documentation

## 2020-12-07 DIAGNOSIS — F0280 Dementia in other diseases classified elsewhere without behavioral disturbance: Secondary | ICD-10-CM

## 2020-12-07 NOTE — Progress Notes (Signed)
Established Patient Office Visit  Subjective:  Patient ID: Alex Meyers, male    DOB: 1940/10/28  Age: 80 y.o. MRN: [id number]  CC:  Chief Complaint  Patient presents with  . Fall    Fell dring the night on the way to the restroom x 1 week, L rib pain    HPI Alex Meyers presents for recent ER follow-up and schedule medical follow-up as well.  He was driven here apparently by his daughter Alex Meyers who is currently staying with him but unfortunately she reportedly had COVID type symptoms and was unable to accompany him into the office.  Branson reportedly had a fall couple days prior to ER visit on 12-04-2020.  He was reportedly walking in his bathroom when he slipped on something on the floor and fell landing on his left side.  He denied any head injury.  He had some left-sided rib cage pain.  Patient states he waited for several hours and left although ER notes indicate that he was seen and actually had rib films which showed no obvious fracture.  He had urinalysis which was unremarkable.  Basic metabolic panel unremarkable except for glucose 179.  CBC revealed hemoglobin 11.9 with normal white count.  Platelet count 148,000.  He does have some continued left-sided rib pain but sounds like his pain is relatively mild and is coping fairly well.  Denies any further falls.  Cognitive impairment with recent assessment per neuropsychologist which confirmed likely Alzheimer's disease.  He does have history of agent orange exposure and we certainly have concerns that this is potentially contributing to his dementia.  We recently started Aricept 5 mg daily and apparently is tolerating this well.  He has only been on this now for a couple of weeks.  Patient had requested second opinion regarding his neurocognitive testing and we are waiting to try to get him into memory care clinic over at [facility].  Family have specifically had concerns recently regarding financial difficulties.  [name] has had some  parent delusional type thinking at times.  Piercen's sister, Alex Meyers. who is a retired Lexicographer is assisting with management of his affairs along with his daughters.  His daughter Alex Meyers who lives in [place] is currently staying with him to assist with getting things in order.  They are helping to manage his medications currently.  Past Medical History:  Diagnosis Date  . Coronary artery disease    s//p DES LAD and Dx1 01/2009  . Diabetes mellitus   . Diverticulosis   . Hx of colonic polyps 08/2010   Dr. Hilarie Meyers  . Hyperlipidemia    pt denies  . Hypertension    pt. denies  . Internal hemorrhoids with Grade 2-3 prolapse and bleeding 09/25/2012   Diagnosed by Dr. Linda Meyers via anoscopy   . Multiple thyroid nodules   . Personal history of colonic polyps 08/09/2002   ADENOMATOUS POLYP  . Tubular adenoma of colon     Past Surgical History:  Procedure Laterality Date  . COLONOSCOPY  multiple  . CORONARY STENT PLACEMENT  01/22/2009   right side  . WRIST SURGERY     right side    Family History  Problem Relation Age of Onset  . Heart failure Mother   . Coronary artery disease Mother   . Bladder Cancer Father 20  . Breast cancer Daughter   . Colon cancer Neg Hx     Social History   Socioeconomic History  . Marital status: Divorced  Spouse name: Not on file  . Number of children: 3  . Years of education: 71  . Highest education level: Doctorate  Occupational History  . Occupation: LAWYER    Employer: [employer]  Tobacco Use  . Smoking status: Never Smoker  . Smokeless tobacco: Never Used  Vaping Use  . Vaping Use: Never used  Substance and Sexual Activity  . Alcohol use: Yes    Alcohol/week: 4.0 standard drinks    Types: 4 Cans of beer per week    Comment: weekly  . Drug use: No  . Sexual activity: Not Currently  Other Topics Concern  . Not on file  Social History Narrative   Alex Meyers, Law school - [employer] . Married '78 the patient is divorced after 65 years  of marriage. Has 3 daughters. He is an active attorney, formerly with [employer] but he has recently ('12) gone out on his own. Serially monogamous, does not always practice safe sex.    Social Determinants of Health   Financial Resource Strain: Medium Risk  . Difficulty of Paying Living Expenses: Somewhat hard  Food Insecurity: No Food Insecurity  . Worried About Charity fundraiser in the Last Year: Never true  . Ran Out of Food in the Last Year: Never true  Transportation Needs: No Transportation Needs  . Lack of Transportation (Medical): No  . Lack of Transportation (Non-Medical): No  Physical Activity: Sufficiently Active  . Days of Exercise per Week: 5 days  . Minutes of Exercise per Session: 30 min  Stress: No Stress Concern Present  . Feeling of Stress : Only a little  Social Connections: Moderately Isolated  . Frequency of Communication with Friends and Family: More than three times a week  . Frequency of Social Gatherings with Friends and Family: More than three times a week  . Attends Religious Services: Never  . Active Member of Clubs or Organizations: Yes  . Attends Archivist Meetings: 1 to 4 times per year  . Marital Status: Divorced  Human resources officer Violence: Not At Risk  . Fear of Current or Ex-Partner: No  . Emotionally Abused: No  . Physically Abused: No  . Sexually Abused: No    Outpatient Medications Prior to Visit  Medication Sig Dispense Refill  . clopidogrel (PLAVIX) 75 MG tablet TAKE 1 TABLET(75 MG) BY MOUTH DAILY 90 tablet 3  . cyanocobalamin 1000 MCG tablet Take 2 tablets once a day    . Dapsone 5 % topical gel     . diclofenac Sodium (VOLTAREN) 1 % GEL Apply 4 g topically 4 (four) times daily. 100 g 0  . donepezil (ARICEPT) 5 MG tablet Take 1 tablet (5 mg total) by mouth at bedtime. 30 tablet 0  . empagliflozin (JARDIANCE) 10 MG TABS tablet Take by mouth.    . Glucosamine HCl 1000 MG TABS Take 1,000 mg by mouth daily.    .  meloxicam (MOBIC) 15 MG tablet Take 1 tablet (15 mg total) by mouth daily. 90 tablet 3  . metFORMIN (GLUCOPHAGE-XR) 500 MG 24 hr tablet Take 2 tablets by mouth 2 (two) times daily. Take two tablets by mouth twice daily  12  . pioglitazone (ACTOS) 45 MG tablet Take 45 mg by mouth daily.  11  . rosuvastatin (CRESTOR) 20 MG tablet Take 1 tablet (20 mg total) by mouth daily. 30 tablet 12  . sitaGLIPtin (JANUVIA) 100 MG tablet Take 1 tablet by mouth once a day for diabetes    .  morphine (MSIR) 15 MG tablet Take 0.5 tablets (7.5 mg total) by mouth every 4 (four) hours as needed for severe pain. 4 tablet 0   No facility-administered medications prior to visit.    No Known Allergies  ROS Review of Systems  Constitutional: Negative for chills and fever.  Respiratory: Negative for cough and shortness of breath.   Cardiovascular: Negative for leg swelling.  Neurological: Negative for headaches.      Objective:    Physical Exam Vitals reviewed.  Constitutional:      Appearance: Normal appearance.  Cardiovascular:     Pulses: Normal pulses.     Heart sounds: Normal heart sounds.  Pulmonary:     Effort: Pulmonary effort is normal.     Breath sounds: Normal breath sounds.     Comments: Chest wall and back reveal no visible bruising.  No spinal tenderness in the cervical or thoracic region.  No localized rib tenderness. Musculoskeletal:     Cervical back: Neck supple.  Neurological:     Mental Status: He is alert.     BP 130/62 (BP Location: Left Arm, Patient Position: Sitting, Cuff Size: Normal)   Pulse 68   Temp 98 F (36.7 C) (Oral)   Wt 160 lb (72.6 kg)   SpO2 96%   BMI 22.32 kg/m  Wt Readings from Last 3 Encounters:  12/07/20 160 lb (72.6 kg)  12/04/20 176 lb 5.9 oz (80 kg)  11/03/20 158 lb 11.2 oz (72 kg)     Health Maintenance Due  Topic Date Due  . Pneumococcal Vaccine 64-62 Years old (1 of 4 - PCV13) Never done  . Hepatitis C Screening  Never done  . URINE  MICROALBUMIN  04/06/2018  . FOOT EXAM  06/15/2018  . HEMOGLOBIN A1C  05/29/2020  . OPHTHALMOLOGY EXAM  06/17/2020    There are no preventive care reminders to display for this patient.  Lab Results  Component Value Date   TSH 1.60 10/06/2020   Lab Results  Component Value Date   WBC 4.0 12/04/2020   HGB 11.9 (L) 12/04/2020   HCT 37.8 (L) 12/04/2020   MCV 94.3 12/04/2020   PLT 148 (L) 12/04/2020   Lab Results  Component Value Date   NA 135 12/04/2020   K 4.0 12/04/2020   CO2 26 12/04/2020   GLUCOSE 179 (H) 12/04/2020   BUN 16 12/04/2020   CREATININE 0.96 12/04/2020   BILITOT 1.0 05/24/2016   ALKPHOS 33 11/27/2019   AST 21 11/27/2019   ALT 13 11/27/2019   PROT 5.7 06/24/2017   ALBUMIN 4.0 11/27/2019   CALCIUM 8.9 12/04/2020   ANIONGAP 9 12/04/2020   GFR 76.88 05/24/2016   Lab Results  Component Value Date   CHOL 134 11/27/2019   Lab Results  Component Value Date   HDL 66 11/27/2019   Lab Results  Component Value Date   LDLCALC 54 11/27/2019   Lab Results  Component Value Date   TRIG 77 11/27/2019   Lab Results  Component Value Date   CHOLHDL 2 05/24/2016   Lab Results  Component Value Date   HGBA1C 6.8 11/27/2019      Assessment & Plan:   #1 recent fall with left anterior rib pain with no fractures evident on plain x-rays.  He seems to be coping fairly well and not requiring any pain management.  #2 cognitive impairment with likely Alzheimer's dementia based on extensive recent neuro cognitive assessment.  We did initiate Aricept 5 mg daily recently and  will plan after 30 days to titrate this up to 10 mg.  Patient had specifically requested second opinion from [facility] and we are in process of trying to get him in there.  -He is currently not agitated.  We discussed various medication options for behavioral issues but would strongly try to avoid atypical antipsychotics at this time unless he becomes extremely agitated because of potential  increased risk of cardiovascular death. -Family have been working with an attorney to try to help with his legal affairs. -There seems to be fairly high probability that his dementia might be related to agent orange as they have no significant family history of dementia and fairly well substantiated link between agent orange and dementia.  Family trying to maximize his VA health benefits.    No orders of the defined types were placed in this encounter.   Follow-up: No follow-ups on file.    Carolann Littler, MD

## 2020-12-07 NOTE — Patient Instructions (Signed)
Rib Contusion A rib contusion is a deep bruise on the rib area. Contusions are the result of a blunt trauma that causes bleeding and injury to the tissues under the skin. A rib contusion may involve bruising of the ribs and of the skin and muscles in the area. The skin over the contusion may turn blue, purple, or yellow. Minor injuries result in a painless contusion. More severe contusions may be painful and swollen for a few weeks. What are the causes? This condition is usually caused by a hard, direct hit to an area of the body. This often occurs while playing contact sports. What are the signs or symptoms? Symptoms of this condition include:  Swelling and redness of the injured area.  Discoloration of the injured area.  Tenderness and soreness of the injured area.  Pain with or without movement.  Pain when breathing in. How is this diagnosed? This condition may be diagnosed based on:  Your symptoms and medical history.  A physical exam.  Imaging tests--such as an X-ray, CT scan, or MRI--to determine if there were internal injuries or broken bones (fractures). How is this treated? This condition may be treated with:  Rest. This is often the best treatment for a rib contusion.  Ice packs. This reduces swelling and inflammation.  Deep-breathing exercises. These may be recommended to reduce the risk for lung collapse and pneumonia.  Medicines. Over-the-counter or prescription medicines may be given to control pain.  Injection of a numbing medicine around the nerve near your injury (nerve block). Follow these instructions at home: Medicines  Take over-the-counter and prescription medicines only as told by your health care provider.  Ask your health care provider if the medicine prescribed to you: ? Requires you to avoid driving or using machinery. ? Can cause constipation. You may need to take these actions to prevent or treat constipation:  Drink enough fluid to keep your  urine pale yellow.  Take over-the-counter or prescription medicines.  Eat foods that are high in fiber, such as beans, whole grains, and fresh fruits and vegetables.  Limit foods that are high in fat and processed sugars, such as fried or sweet foods. Managing pain, stiffness, and swelling If directed, put ice on the injured area. To do this:  Put ice in a plastic bag.  Place a towel between your skin and the bag.  Leave the ice on for 20 minutes, 2-3 times a day.  Remove the ice if your skin turns bright red. This is very important. If you cannot feel pain, heat, or cold, you have a greater risk of damage to the area.   Activity  Rest the injured area.  Avoid strenuous activity and any activities or movements that cause pain. Be careful during activities, and avoid bumping the injured area.  Do not lift anything that is heavier than 5 lb (2.3 kg), or the limit that you are told, until your health care provider says that it is safe. General instructions  Do not use any products that contain nicotine or tobacco, such as cigarettes, e-cigarettes, and chewing tobacco. These can delay healing. If you need help quitting, ask your health care provider.  Do deep-breathing exercises as told by your health care provider.  If you were given an incentive spirometer, use it every 1-2 hours while you are awake, or as recommended by your health care provider. This device measures how well you are filling your lungs with each breath.  Keep all follow-up visits. This is  important.   Contact a health care provider if you have:  Increased bruising or swelling.  Pain that is not controlled with treatment.  A fever. Get help right away if you:  Have difficulty breathing or shortness of breath.  Develop a continual cough, or you cough up thick or bloody mucus from your lungs (sputum).  Feel nauseous or you vomit.  Have pain in your abdomen. These symptoms may represent a serious problem  that is an emergency. Do not wait to see if the symptoms will go away. Get medical help right away. Call your local emergency services (911 in the [place] Do not drive yourself to the hospital. Summary  A rib contusion is a deep bruise on your rib area. Contusions are the result of a blunt trauma that causes bleeding and injury to the tissues under the skin.  The skin over the contusion may turn blue, purple, or yellow. Minor injuries may cause a painless contusion. More severe contusions may be painful and swollen for a few weeks.  Rest the injured area. Avoid strenuous activity and any activities or movements that cause pain. This information is not intended to replace advice given to you by your health care provider. Make sure you discuss any questions you have with your health care provider. Document Revised: 09/25/2019 Document Reviewed: 09/25/2019 Elsevier Patient Education  [place].

## 2020-12-08 ENCOUNTER — Encounter: Payer: Self-pay | Admitting: Family Medicine

## 2020-12-08 ENCOUNTER — Telehealth: Payer: Medicare HMO

## 2020-12-11 ENCOUNTER — Ambulatory Visit (INDEPENDENT_AMBULATORY_CARE_PROVIDER_SITE_OTHER): Payer: Medicare HMO | Admitting: *Deleted

## 2020-12-11 DIAGNOSIS — G301 Alzheimer's disease with late onset: Secondary | ICD-10-CM

## 2020-12-11 DIAGNOSIS — E11628 Type 2 diabetes mellitus with other skin complications: Secondary | ICD-10-CM | POA: Diagnosis not present

## 2020-12-11 DIAGNOSIS — E118 Type 2 diabetes mellitus with unspecified complications: Secondary | ICD-10-CM

## 2020-12-11 DIAGNOSIS — L84 Corns and callosities: Secondary | ICD-10-CM

## 2020-12-11 DIAGNOSIS — G3184 Mild cognitive impairment, so stated: Secondary | ICD-10-CM

## 2020-12-11 DIAGNOSIS — F0280 Dementia in other diseases classified elsewhere without behavioral disturbance: Secondary | ICD-10-CM | POA: Diagnosis not present

## 2020-12-11 DIAGNOSIS — I2510 Atherosclerotic heart disease of native coronary artery without angina pectoris: Secondary | ICD-10-CM | POA: Diagnosis not present

## 2020-12-11 DIAGNOSIS — I10 Essential (primary) hypertension: Secondary | ICD-10-CM | POA: Diagnosis not present

## 2020-12-11 DIAGNOSIS — R5383 Other fatigue: Secondary | ICD-10-CM

## 2020-12-11 DIAGNOSIS — H9193 Unspecified hearing loss, bilateral: Secondary | ICD-10-CM

## 2020-12-11 DIAGNOSIS — R4189 Other symptoms and signs involving cognitive functions and awareness: Secondary | ICD-10-CM

## 2020-12-11 NOTE — Chronic Care Management (AMB) (Signed)
Chronic Care Management    Clinical Social Work Note  12/11/2020 Name: Alex Meyers MRN: [id number] DOB: 27-Nov-1940  Alex Meyers is a 80 y.o. year old male who is a primary care patient of Burchette, Alinda Sierras, MD. The CCM team was consulted to assist the patient with chronic disease management and/or care coordination needs related to: [employer], Level of Care Concerns, and Caregiver Stress in Patient with Alzheimer's Disease, Coronary Artery Disease, Hypertension, Type II Diabetes Mellitus w/ Pressure Callus, Osteoarthritis and Tinnitus [place].  Engaged with patient by telephone for follow up visit in response to provider referral for social work chronic care management and care coordination services.   Consent to Services:  The patient was given information about Chronic Care Management services, agreed to services, and gave verbal consent prior to initiation of services.  Please see initial visit note for detailed documentation.   Patient agreed to services and consent obtained.   Assessment: Review of patient past medical history, allergies, medications, and health status, including review of relevant consultants reports was performed today as part of a comprehensive evaluation and provision of chronic care management and care coordination services.     SDOH (Social Determinants of Health) assessments and interventions performed:    Advanced Directives Status: See Care Plan for related entries.  CCM Care Plan  No Known Allergies  Outpatient Encounter Medications as of 12/11/2020  Medication Sig   clopidogrel (PLAVIX) 75 MG tablet TAKE 1 TABLET(75 MG) BY MOUTH DAILY   cyanocobalamin 1000 MCG tablet Take 2 tablets once a day   Dapsone 5 % topical gel    diclofenac Sodium (VOLTAREN) 1 % GEL Apply 4 g topically 4 (four) times daily.   donepezil (ARICEPT) 5 MG tablet Take 1 tablet (5 mg total) by mouth at bedtime.   empagliflozin (JARDIANCE) 10 MG TABS tablet Take by  mouth.   Glucosamine HCl 1000 MG TABS Take 1,000 mg by mouth daily.   meloxicam (MOBIC) 15 MG tablet Take 1 tablet (15 mg total) by mouth daily.   metFORMIN (GLUCOPHAGE-XR) 500 MG 24 hr tablet Take 2 tablets by mouth 2 (two) times daily. Take two tablets by mouth twice daily   pioglitazone (ACTOS) 45 MG tablet Take 45 mg by mouth daily.   rosuvastatin (CRESTOR) 20 MG tablet Take 1 tablet (20 mg total) by mouth daily.   sitaGLIPtin (JANUVIA) 100 MG tablet Take 1 tablet by mouth once a day for diabetes   No facility-administered encounter medications on file as of 12/11/2020.    Patient Active Problem List   Diagnosis Date Noted   Dementia ([place]) 12/07/2020   Tear of right supraspinatus tendon 10/14/2019   Sore nose 10/12/2017   B12 deficiency 05/11/2017   Type 2 diabetes mellitus with pressure callus ([place]) 07/07/2015   Tinnitus aurium 03/05/2015   Internal hemorrhoids with Grade 2-3 prolapse and bleeding 09/25/2012   Benign localized hyperplasia of prostate without urinary obstruction and other lower urinary tract symptoms (LUTS) 06/01/2011   History of adenomatous polyp of colon 03/25/2011   Routine general medical examination at a health care facility 12/28/2010   ERECTILE DYSFUNCTION, ORGANIC 11/30/2009   OSTEOARTHRITIS, GENERALIZED 11/27/2009   CAD (coronary artery disease) 02/01/2009   Diabetes mellitus with complication ([place]) 51/76/1607   Hyperlipidemia with target LDL less than 70 01/31/2009   THYROID NODULE 11/13/2006   Essential hypertension 11/13/2006   IBS 11/13/2006    Conditions to be addressed/monitored:  [employer], Level of Care Concerns, and Caregiver  Stress in Patient with Alzheimer's Disease, Coronary Artery Disease, Hypertension, Type II Diabetes Mellitus w/ Pressure Callus, Osteoarthritis and Tinnitus [place]. Financial Constraints Related to [employer], Limited Social Support, Level of Care Concerns, ADL/IADL Limitations, Mental Health Concerns, and  Limited Access to Caregiver.  Care Plan : LCSW Plan of Care  Updates made by Francis Gaines, LCSW since 12/11/2020 12:00 AM     Problem: Maintain My Qualify of Life by Receiving [place]. Resolved 12/11/2020  Priority: High     Long-Range Goal: Maintain My Qualify of Life by Receiving [place]. Completed 12/11/2020  Start Date: 11/20/2020  Expected End Date: 12/11/2020  This Visit's Progress: On track  Recent Progress: On track  Priority: High  Note:   Current Barriers:   Patient with Alzheimer's Disease, Coronary Artery Disease, Hypertension, Type II Diabetes Mellitus w/ Pressure Callus, Osteoarthritis and Tinnitus [place], needs Support, Education, and Care Coordination to resolve unmet personal care needs in the home. Patient is unable to self-administer medications as prescribed. Patient is unable to consistently perform ADL's/IADL's independently. Patient is unable to afford to pay for in-home care services out-of-pocket, due to recent money scam. Patient presents level of care concerns living alone, now requiring 24 hour care and supervision.   Patient and daughter lack knowledge of available community agencies and resources. Clinical Goals:  Over the next 45 to 60 days, patient will have in-home care services in place, either through [employer] and Attendance Benefits or through [employer].    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 Patient and daughter will select a Personal Care Service Provider, from the list mailed to them by LCSW. Patient and daughter will receive an Aid and Attendance Benefits application, mailed to them by LCSW, and notify LCSW if they need assistance with application completion and submission. Patient and daughter will receive a  [place] application, mailed to them by LCSW, and notify LCSW if they need assistance with application completion and submission. Patient and daughter will receive an Adult Medicaid application, mailed to them by LCSW, and notify LCSW if they need assistance with application completion and submission. Patient will attend all scheduled medical appointments as evidenced by patient report and care team review of appointment completion in electronic medical record. Patient will demonstrate improved health management independence as evidenced by having in-home care services in place. Clinical Interventions: Patient, daughter, sister and brother-in-law were interviewed and appropriate assessments performed. Collaboration with patient's Primary Care Physician, Dr. Carolann Littler regarding development and update of comprehensive plan of care as evidenced by provider attestation and co-signature. Inter-disciplinary care team collaboration (see longitudinal plan of care). Interventions performed:  Problem Solving/Task Centered, Psychoeducation/Health Education, Quality of Sleep Assessed and Sleep Hygiene Techniques Promoted, Caregiver Stress Acknowledged and Consideration of In-Home Care Services Encouraged. Provided patient and daughter with the following list of resources and applications: *[place] [id number] I REQUEST FOR INDEPENDENT ASSESSMENT FOR PERSONAL CARE SERVICES ATTESTATION OF MEDICAL NEED *[id number] REQUEST FOR INDEPENDENT ASSESSMENT FOR PERSONAL CARE SERVICES (PCS) ATTESTATION OF MEDICAL NEED *PERSONAL CARE SERVICE PROVIDERS *2021 MEDICAID TIPS *APPLICATION FOR MEDICAID [place] HEALTH AND HUMAN SERVICES *ADULT DAY CARE CENTERS *EXAMINATION FOR HOUSEBOUND STATUS OR PERMANENT NEED FOR REGULAR AID AND ATTENDANCE *INFORMATION AND INSTRUCTIONS TO HELP YOU COMPLETE THE AUTHORIZATION TO DISCLOSE PERSONAL INFORMATION TO A THIRD PARTY *STATEMENT IN SUPPORT OF CLAIM *ADULT  DAY  CARE PROGRAMS Discussed plans with patient and daughter for ongoing care management follow-up and provided direct contact information for care management team. Collaborated with patient's Primary Care Physician, Dr. Carolann Littler regarding need for review and signature of [place] application and Aid and Attendance Benefits application. Assisted patient and daughter with obtaining information about health plan benefits through [facility]. Provided education to patient and daughter regarding level of care options. Assessed needs, level of care concerns, basic eligibility and provided education on Personal Care Service process, Aid and Attendance Benefits process and Adult Medicaid application process. Personal Care Services application and Aid and Attendance Benefits application will be faxed to [place], once completed and signed by patient's Primary Care Physician, Dr. Carolann Littler. LCSW collaboration with [place] to verify application is received and processed. Identified resources and durable medical equipment needed in the home to improve safety and promote independence. Patient and daughter have been encouraged to apply for Adult Medicaid, through the [place]. Provided list of Adult Day Care Programs and explained services and resources provided through attendance. Patient Goals/Self-Care Activities:  Performed successful outreach call w/ patient, patient's daughter's, Kamari, Buch and Jake Seats, patient's sister, Kenna Gilbert and brother-in-law, Lajoyce Lauber, via 3-way call, to collaborate about long-term care planning options for patient. Educated family members about symptoms associated with Alzheimer's/Dementia, what to expect, and provided them with techniques and suggestions for how to best manage his care in the  home. Encouraged patient, patient's daughter's, Daven, [place] and Jake Seats, patient's sister, Kenna Gilbert and brother-in-law, Lajoyce Lauber to consider long-term placement for patient into a memory care assisted living facility. Encouraged patient, patient's daughter's, Norval Gable, Deborah Skillin and Jake Seats, patient's sister, Kenna Gilbert and brother-in-law, Lajoyce Lauber to contact LCSW directly [phone]) if you need assistance with long-term memory care assisted living placement for patient, or if additional social work needs are identified in the near future. Remember: Always use handrails on the stairs. Always use your cane or walker when ambulating. Always wear your glasses, especially at night or in areas not well lit.  Follow-Up:  No Follow-Up Required.      Follow Up Plan: No Follow-Up Required.      [name] LCSW Licensed Clinical Social Worker [place] [phone]

## 2020-12-11 NOTE — Patient Instructions (Signed)
Visit Information  PATIENT GOALS:  Goals Addressed             This Visit's Progress    COMPLETED: Maintain My Qualify of Life by Receiving [place].   On track    Timeframe:  Long-Range Goal Priority:  High Start Date:  11/20/2020                     Expected End Date:  12/11/2020             Follow-Up Date:  No Follow-Up Required.  Patient Goals/Self-Care Activities: Performed successful outreach call w/ patient, patient's daughter's, [place], [place] and Jake Seats, patient's sister, Kenna Gilbert and brother-in-law, Lajoyce Lauber, via 3-way call, to collaborate about long-term care planning options for patient. Educated family members about symptoms associated with Alzheimer's/Dementia, what to expect, and provided them with techniques and suggestions for how to best manage his care in the home. Encouraged patient, patient's daughter's, [place], Amos and Jake Seats, patient's sister, Kenna Gilbert and brother-in-law, Lajoyce Lauber to consider long-term placement for patient into a memory care assisted living facility. Encouraged patient, patient's daughter's, Norval Gable, Deborah Woolen and Jake Seats, patient's sister, Kenna Gilbert and brother-in-law, Lajoyce Lauber to contact LCSW directly [phone]) if you need assistance with long-term memory care assisted living placement for patient, or if additional social work needs are identified in the near future. Remember: Always use handrails on the stairs. Always use your cane or walker when ambulating. Always wear your glasses, especially at night or in areas not well lit.          Patient verbalizes understanding of instructions provided today and agrees to view in [place].   Telephone follow up appointment with care management team member scheduled for:  01/07/2021 at 12:00pm.  [name] [employer] Licensed Clinical Social Worker [place] [phone]

## 2020-12-15 MED ORDER — DONEPEZIL HCL 10 MG PO TABS: 10.0000 mg | ORAL_TABLET | Freq: Every day | ORAL | 3 refills | Status: AC

## 2020-12-17 DIAGNOSIS — E1159 Type 2 diabetes mellitus with other circulatory complications: Secondary | ICD-10-CM | POA: Diagnosis not present

## 2020-12-17 DIAGNOSIS — E7800 Pure hypercholesterolemia, unspecified: Secondary | ICD-10-CM | POA: Diagnosis not present

## 2020-12-18 DIAGNOSIS — Z7984 Long term (current) use of oral hypoglycemic drugs: Secondary | ICD-10-CM | POA: Diagnosis not present

## 2020-12-18 DIAGNOSIS — I2510 Atherosclerotic heart disease of native coronary artery without angina pectoris: Secondary | ICD-10-CM | POA: Diagnosis not present

## 2020-12-18 DIAGNOSIS — E042 Nontoxic multinodular goiter: Secondary | ICD-10-CM | POA: Diagnosis not present

## 2020-12-18 DIAGNOSIS — E7800 Pure hypercholesterolemia, unspecified: Secondary | ICD-10-CM | POA: Diagnosis not present

## 2020-12-18 DIAGNOSIS — E1165 Type 2 diabetes mellitus with hyperglycemia: Secondary | ICD-10-CM | POA: Diagnosis not present

## 2020-12-18 DIAGNOSIS — E538 Deficiency of other specified B group vitamins: Secondary | ICD-10-CM | POA: Diagnosis not present

## 2020-12-22 ENCOUNTER — Telehealth: Payer: Self-pay | Admitting: Family Medicine

## 2020-12-22 ENCOUNTER — Other Ambulatory Visit: Payer: Self-pay | Admitting: Family Medicine

## 2020-12-22 MED ORDER — DONEPEZIL HCL 10 MG PO TABS: 10.0000 mg | ORAL_TABLET | Freq: Every day | ORAL | 3 refills | Status: DC

## 2020-12-22 NOTE — Telephone Encounter (Signed)
Please advise 

## 2020-12-22 NOTE — Telephone Encounter (Signed)
Pts sister is calling in stating that they thought that the pt was going to be getting a increase in Rx donepezil (ARICEPT) 5 MG to 10 MG and they have picked up two one for 5MG  and one for 10 MG.   Sister would like to have a call back.

## 2020-12-22 NOTE — Telephone Encounter (Signed)
Rx sent in. Spoke with Opal Sidles, she is aware that he should be on the 10mg . Nothing further needed.

## 2020-12-24 DIAGNOSIS — G309 Alzheimer's disease, unspecified: Secondary | ICD-10-CM | POA: Diagnosis not present

## 2020-12-24 DIAGNOSIS — F0280 Dementia in other diseases classified elsewhere without behavioral disturbance: Secondary | ICD-10-CM | POA: Diagnosis not present

## 2020-12-24 DIAGNOSIS — Z7189 Other specified counseling: Secondary | ICD-10-CM | POA: Diagnosis not present

## 2020-12-25 ENCOUNTER — Encounter: Payer: Self-pay | Admitting: Family Medicine

## 2020-12-25 DIAGNOSIS — R400 Somnolence: Secondary | ICD-10-CM

## 2020-12-29 DIAGNOSIS — D692 Other nonthrombocytopenic purpura: Secondary | ICD-10-CM | POA: Diagnosis not present

## 2020-12-29 DIAGNOSIS — Z85828 Personal history of other malignant neoplasm of skin: Secondary | ICD-10-CM | POA: Diagnosis not present

## 2020-12-29 DIAGNOSIS — L570 Actinic keratosis: Secondary | ICD-10-CM | POA: Diagnosis not present

## 2020-12-29 DIAGNOSIS — L738 Other specified follicular disorders: Secondary | ICD-10-CM | POA: Diagnosis not present

## 2020-12-29 DIAGNOSIS — L821 Other seborrheic keratosis: Secondary | ICD-10-CM | POA: Diagnosis not present

## 2021-01-08 ENCOUNTER — Other Ambulatory Visit: Payer: Self-pay | Admitting: *Deleted

## 2021-01-08 MED ORDER — ASPIRIN EC 81 MG PO TBEC: 81.0000 mg | DELAYED_RELEASE_TABLET | Freq: Every day | ORAL | 3 refills | Status: AC

## 2021-01-12 ENCOUNTER — Telehealth: Payer: Self-pay | Admitting: Family Medicine

## 2021-01-12 NOTE — Telephone Encounter (Signed)
Spoke with the patient's daughter she is aware of Dr. Erick Blinks instructions. Nothing further needed at this time.

## 2021-01-12 NOTE — Telephone Encounter (Signed)
Please advise 

## 2021-01-12 NOTE — Telephone Encounter (Signed)
Triage Nurse is calling in stating that the pt has double up on his medication (aricept, metformin, actos, crestor and [place]) last night and want to know if he should take them today.  Pt would like to have a call back to let him know what he needs to do.

## 2021-02-09 ENCOUNTER — Encounter: Payer: Self-pay | Admitting: Family Medicine

## 2021-02-12 ENCOUNTER — Other Ambulatory Visit: Payer: Self-pay

## 2021-02-15 ENCOUNTER — Other Ambulatory Visit: Payer: Self-pay

## 2021-02-15 ENCOUNTER — Ambulatory Visit (INDEPENDENT_AMBULATORY_CARE_PROVIDER_SITE_OTHER): Payer: Medicare HMO | Admitting: Family Medicine

## 2021-02-15 VITALS — BP 110/62 | HR 67 | Temp 98.1°F | Wt 154.2 lb

## 2021-02-15 DIAGNOSIS — R202 Paresthesia of skin: Secondary | ICD-10-CM | POA: Diagnosis not present

## 2021-02-15 DIAGNOSIS — R3915 Urgency of urination: Secondary | ICD-10-CM | POA: Diagnosis not present

## 2021-02-15 DIAGNOSIS — R252 Cramp and spasm: Secondary | ICD-10-CM

## 2021-02-15 NOTE — Progress Notes (Signed)
Established Patient Office Visit  Subjective:  Patient ID: Alex Meyers, male    DOB: 02/27/1941  Age: 80 y.o. MRN: [id number]  CC:  Chief Complaint  Patient presents with   bowel movments irregular    Pt states that his BM have become irregular and sometimes he is unable to make it to the restroom in time.  Pt also complains of numbness in the toes.  Pt also complains of leg cramps, states that this usually wakes him up in the early morning    HPI Alex Meyers presents along with his sister, Kenna Gilbert, who has assisted with his care tremendously in recent months.  Alex Meyers has history of CAD, hypertension, IBS, type 2 diabetes, hyperlipidemia, recently diagnosed dementia.  He initially saw neuropsychologist here in [place] who made the diagnosis of likely Alzheimer's dementia.  Patient requested a second opinion and was sent over to [facility] and they concur with the diagnosis.  He is currently on Aricept.  They are looking at him moving to [place] as they found a nice residential place there for him.  They will be reestablishing primary care in [place].  They plan to continue geriatric care through the specialty clinic over at [facility].  He has previously gotten his diabetes follow-up through endocrinologist.  Patient complains of occasional urine frequency at night.  Usually no more than once or twice at night.  Denies any significant slow stream.  Diabetes fairly well controlled.  No recent burning with urination.  No consistent alcohol use at night.  Tries to avoid late the use of caffeine.  Recently had some muscle cramps especially calf muscles.  This occurs with mostly early morning hours and at night.  He feels like he is hydrating well.  No diuretic use.  Recent electrolytes stable.  Usually has cramps about twice per week  Does have sudden urge to defecate at times.  Occasional loss of control but for the most part has solid bowel movements.  Does have history of IBS.   No consistent diarrhea symptoms.  He had colonoscopy just couple years ago with couple of benign polyps but no other abnormalities.  He has some mild numbness involving some his toes.  Longstanding history of diabetes.  Recent thyroid functions and B12 normal.  Denies any lower extremity weakness.  Past Medical History:  Diagnosis Date   Coronary artery disease    s//p DES LAD and Dx1 01/2009   Diabetes mellitus    Diverticulosis    Hx of colonic polyps 08/2010   Dr. Hilarie Fredrickson   Hyperlipidemia    pt denies   Hypertension    pt. denies   Internal hemorrhoids with Grade 2-3 prolapse and bleeding 09/25/2012   Diagnosed by Dr. Linda Hedges via anoscopy    Multiple thyroid nodules    Personal history of colonic polyps 08/09/2002   ADENOMATOUS POLYP   Tubular adenoma of colon     Past Surgical History:  Procedure Laterality Date   COLONOSCOPY  multiple   CORONARY STENT PLACEMENT  01/22/2009   right side   WRIST SURGERY     right side    Family History  Problem Relation Age of Onset   Heart failure Mother    Coronary artery disease Mother    Bladder Cancer Father 14   Breast cancer Daughter    Colon cancer Neg Hx     Social History   Socioeconomic History   Marital status: Divorced    Spouse name: Not  on file   Number of children: 3   Years of education: 42   Highest education level: Doctorate  Occupational History   Occupation: Insurance underwriter: [employer]  Tobacco Use   Smoking status: Never   Smokeless tobacco: Never  Vaping Use   Vaping Use: Never used  Substance and Sexual Activity   Alcohol use: Yes    Alcohol/week: 4.0 standard drinks    Types: 4 Cans of beer per week    Comment: weekly   Drug use: No   Sexual activity: Not Currently  Other Topics Concern   Not on file  Social History Narrative   [name], Law school - [employer] . Married '78 the patient is divorced after 67 years of marriage. Has 3 daughters. He is an active attorney, formerly with  [employer] but he has recently ('12) gone out on his own. Serially monogamous, does not always practice safe sex.    Social Determinants of Health   Financial Resource Strain: Medium Risk   Difficulty of Paying Living Expenses: Somewhat hard  Food Insecurity: No Food Insecurity   Worried About Charity fundraiser in the Last Year: Never true   Ran Out of Food in the Last Year: Never true  Transportation Needs: No Transportation Needs   Lack of Transportation (Medical): No   Lack of Transportation (Non-Medical): No  Physical Activity: Sufficiently Active   Days of Exercise per Week: 5 days   Minutes of Exercise per Session: 30 min  Stress: No Stress Concern Present   Feeling of Stress : Only a little  Social Connections: Moderately Isolated   Frequency of Communication with Friends and Family: More than three times a week   Frequency of Social Gatherings with Friends and Family: More than three times a week   Attends Religious Services: Never   Marine scientist or Organizations: Yes   Attends Music therapist: 1 to 4 times per year   Marital Status: Divorced  Human resources officer Violence: Not At Risk   Fear of Current or Ex-Partner: No   Emotionally Abused: No   Physically Abused: No   Sexually Abused: No    Outpatient Medications Prior to Visit  Medication Sig Dispense Refill   aspirin EC 81 MG tablet Take 1 tablet (81 mg total) by mouth daily. Swallow whole. 90 tablet 3   cyanocobalamin 1000 MCG tablet Take 2 tablets once a day     Dapsone 5 % topical gel      diclofenac Sodium (VOLTAREN) 1 % GEL Apply 4 g topically 4 (four) times daily. 100 g 0   donepezil (ARICEPT) 10 MG tablet Take 1 tablet (10 mg total) by mouth at bedtime. 90 tablet 3   empagliflozin (JARDIANCE) 10 MG TABS tablet Take by mouth.     Glucosamine HCl 1000 MG TABS Take 1,000 mg by mouth daily.     metFORMIN (GLUCOPHAGE-XR) 500 MG 24 hr tablet Take 2 tablets by mouth 2 (two) times  daily. Take two tablets by mouth twice daily  12   pioglitazone (ACTOS) 45 MG tablet Take 45 mg by mouth daily.  11   rosuvastatin (CRESTOR) 20 MG tablet Take 1 tablet (20 mg total) by mouth daily. 30 tablet 12   sitaGLIPtin (JANUVIA) 100 MG tablet Take 1 tablet by mouth once a day for diabetes     donepezil (ARICEPT) 10 MG tablet Take 1 tablet (10 mg total) by mouth at bedtime. 90 tablet  3   meloxicam (MOBIC) 15 MG tablet Take 1 tablet (15 mg total) by mouth daily. 90 tablet 3   No facility-administered medications prior to visit.    No Known Allergies  ROS Review of Systems  Constitutional:  Negative for chills and fever.  Respiratory:  Negative for cough and shortness of breath.   Cardiovascular:  Negative for chest pain.  Gastrointestinal:  Negative for abdominal pain, blood in stool, diarrhea, nausea and vomiting.  Genitourinary:  Positive for frequency. Negative for difficulty urinating and dysuria.  Neurological:  Positive for numbness. Negative for seizures, syncope, speech difficulty and weakness.     Objective:    Physical Exam Vitals reviewed.  Constitutional:      Appearance: Normal appearance.  Cardiovascular:     Rate and Rhythm: Normal rate and regular rhythm.  Pulmonary:     Effort: Pulmonary effort is normal.     Breath sounds: Normal breath sounds.  Musculoskeletal:     Right lower leg: No edema.     Left lower leg: No edema.  Neurological:     Mental Status: He is alert.     Comments: He has intact sensation with monofilament throughout both feet.  No calluses.  No skin lesions.    BP 110/62 (BP Location: Left Arm, Patient Position: Sitting, Cuff Size: Normal)   Pulse 67   Temp 98.1 F (36.7 C) (Oral)   Wt 154 lb 3.2 oz (69.9 kg)   SpO2 98%   BMI 21.51 kg/m  Wt Readings from Last 3 Encounters:  02/15/21 154 lb 3.2 oz (69.9 kg)  12/07/20 160 lb (72.6 kg)  12/04/20 176 lb 5.9 oz (80 kg)     Health Maintenance Due  Topic Date Due   Hepatitis C  Screening  Never done   URINE MICROALBUMIN  04/06/2018   FOOT EXAM  06/15/2018   HEMOGLOBIN A1C  05/29/2020   OPHTHALMOLOGY EXAM  06/17/2020   COVID-19 Vaccine (4 - Booster for Moderna series) 08/13/2020   INFLUENZA VACCINE  02/01/2021    There are no preventive care reminders to display for this patient.  Lab Results  Component Value Date   TSH 1.60 10/06/2020   Lab Results  Component Value Date   WBC 4.0 12/04/2020   HGB 11.9 (L) 12/04/2020   HCT 37.8 (L) 12/04/2020   MCV 94.3 12/04/2020   PLT 148 (L) 12/04/2020   Lab Results  Component Value Date   NA 135 12/04/2020   K 4.0 12/04/2020   CO2 26 12/04/2020   GLUCOSE 179 (H) 12/04/2020   BUN 16 12/04/2020   CREATININE 0.96 12/04/2020   BILITOT 1.0 05/24/2016   ALKPHOS 33 11/27/2019   AST 21 11/27/2019   ALT 13 11/27/2019   PROT 5.7 06/24/2017   ALBUMIN 4.0 11/27/2019   CALCIUM 8.9 12/04/2020   ANIONGAP 9 12/04/2020   GFR 76.88 05/24/2016   Lab Results  Component Value Date   CHOL 134 11/27/2019   Lab Results  Component Value Date   HDL 66 11/27/2019   Lab Results  Component Value Date   LDLCALC 54 11/27/2019   Lab Results  Component Value Date   TRIG 77 11/27/2019   Lab Results  Component Value Date   CHOLHDL 2 05/24/2016   Lab Results  Component Value Date   HGBA1C 6.8 11/27/2019      Assessment & Plan:   #1 probable Alzheimer's dementia.  He has had neuro psychological assessment both here in [place] and [facility]  with similar diagnoses.  He has been slow to accept the diagnosis.  Very supportive family. -We will be continuing follow-up over at [facility] -Currently on Aricept 10 mg daily  #2 intermittent muscle cramps calves.  Recent electrolytes unremarkable.  Stressed importance of adequate hydration.  Consider magnesium level and repeat basic metabolic panel if symptoms persist  #3 intermittent bowel movement urgency.  Does have diagnosis of IBS type symptoms in the past.  No  worrisome symptoms.  Colonoscopy couple years ago mostly unremarkable.  Patient is on extended release metformin but has not had consistent problems with loose stools with metformin.  No consistent incontinence symptoms.  Consider keeping a diary to see if there is any good correlation with specific foods.  #4 has some paresthesias and numbness involving some of his toes.  No impairment with monofilament testing on today's exam.  Likely has some mild neuropathy related to his longstanding diabetes.  Recent B12 level normal.  No orders of the defined types were placed in this encounter.   Follow-up: No follow-ups on file.    Carolann Littler, MD

## 2021-02-19 ENCOUNTER — Ambulatory Visit: Payer: Medicare HMO | Admitting: Pulmonary Disease

## 2021-02-19 ENCOUNTER — Other Ambulatory Visit: Payer: Self-pay

## 2021-02-19 ENCOUNTER — Encounter: Payer: Self-pay | Admitting: Pulmonary Disease

## 2021-02-19 VITALS — BP 100/64 | HR 62 | Temp 98.0°F | Ht 71.0 in | Wt 150.4 lb

## 2021-02-19 DIAGNOSIS — R0683 Snoring: Secondary | ICD-10-CM

## 2021-02-19 NOTE — Progress Notes (Signed)
[facility]  Chief Complaint  Patient presents with   Consult    Having trouble falling asleep.     Constitutional:  BP 100/64   Pulse 62   Temp 98 F (36.7 C) (Oral)   Ht '5\' 11"'$  (1.803 m)   Wt 150 lb 6.4 oz (68.2 kg)   SpO2 98%   BMI 20.98 kg/m   Past Medical History:  CAD, DM, Diverticulosis, Colon polyps, HLD, HTN, Thyroid nodules, Dementia  Past Surgical History:  He  has a past surgical history that includes Colonoscopy (multiple); Coronary stent placement (01/22/2009); and Wrist surgery.  Brief Summary:  Alex Meyers is a 80 y.o. male with sleep difficulties.      Subjective:   He is here with his sister who is a retired Lexicographer from [employer].  He has trouble with short term memory.  He was seen at [facility] neurology and then [facility].  Diagnosed with early stages of dementia.  He has trouble staying asleep and then falls asleep during the day.  He doesn't have a set schedule during the day, but his family is working on this and he will be moving into assisted living facility in [place] in September.  He watches TV at night.  He has been taking melatonin 20 mg at the time he goes to bed.  He snores some.  He goes to sleep at midnight.  He falls asleep after 45 to 60 minutes.  He wakes up several times to use the bathroom.  He gets out of bed at 730 am.  He feels okay in the morning, but gets tired later in the day.  He denies morning headache.  He drinks coffee in the morning.  He denies sleep walking, sleep talking, bruxism, or nightmares.  There is no history of restless legs.  He denies sleep hallucinations, sleep paralysis, or cataplexy.  The Epworth score is 17 out of 24.  He was in [place] and had exposure to agent orange.   Physical Exam:   Appearance - well kempt   ENMT - no sinus tenderness, no oral exudate, no LAN, Mallampati 3 airway, no stridor  Respiratory - equal breath sounds  bilaterally, no wheezing or rales  CV - s1s2 regular rate and rhythm, no murmurs  Ext - no clubbing, no edema  Skin - no rashes  Psych - normal mood and affect   Sleep Tests:    Social History:  He  reports that he has never smoked. He has never used smokeless tobacco. He reports current alcohol use of about 4.0 standard drinks per week. He reports that he does not use drugs.  Family History:  His family history includes Bladder Cancer (age of onset: 9) in his father; Breast cancer in his daughter; Coronary artery disease in his mother; Heart failure in his mother.    Discussion:  He has sleep disruption, daytime sleepiness, and snoring.  He has recent diagnosis of dementia.  He had exposure to agent orange during the [place].  Need to determine if he has secondary cause for his sleep issues, such as sleep apnea, or if this is all part of process with dementia.  Assessment/Plan:   Snoring. - will arrange for home sleep study; he is moving to [place] in September, so will try to expedite when this can get scheduled  Dementia. - discussed how this can impact circadian rhythm - advised him to maintain regular schedule  for sleep/wake, and meals - he should avoid bright light sources close to bed time, and get natural light exposure in the morning - advised him to reduce melatonin to 10 mg nightly and take 30 to 45 minutes before his desired bedtime  Time Spent Involved in Patient Care on Day of Examination:  38 minutes  Follow up:   Patient Instructions  Take melatonin 10 mg about 45 minutes before your desired bedtime. Avoid bright light sources close to bedtime. Try to get natural light exposure in the morning hours. Try to keep a regular schedule during the day. Limit how often you take naps during the day to 15 to 30 minutes. Will arrange for home sleep study and call with results  Medication List:   Allergies as of 02/19/2021   No Known Allergies       Medication List        Accurate as of February 19, 2021 12:57 PM. If you have any questions, ask your nurse or doctor.          aspirin EC 81 MG tablet Take 1 tablet (81 mg total) by mouth daily. Swallow whole.   cyanocobalamin 1000 MCG tablet Take 2 tablets once a day   Dapsone 5 % topical gel   diclofenac Sodium 1 % Gel Commonly known as: VOLTAREN Apply 4 g topically 4 (four) times daily.   donepezil 10 MG tablet Commonly known as: ARICEPT Take 1 tablet (10 mg total) by mouth at bedtime.   empagliflozin 10 MG Tabs tablet Commonly known as: JARDIANCE Take by mouth.   Glucosamine HCl 1000 MG Tabs Take 1,000 mg by mouth daily.   metFORMIN 500 MG 24 hr tablet Commonly known as: GLUCOPHAGE-XR Take 2 tablets by mouth 2 (two) times daily. Take two tablets by mouth twice daily   pioglitazone 45 MG tablet Commonly known as: ACTOS Take 45 mg by mouth daily.   rosuvastatin 20 MG tablet Commonly known as: Crestor Take 1 tablet (20 mg total) by mouth daily.   sitaGLIPtin 100 MG tablet Commonly known as: JANUVIA Take 1 tablet by mouth once a day for diabetes        Signature:  Chesley Mires, MD [place] Pager - [phone] 02/19/2021, 12:57 PM

## 2021-02-19 NOTE — Patient Instructions (Signed)
Take melatonin 10 mg about 45 minutes before your desired bedtime. Avoid bright light sources close to bedtime. Try to get natural light exposure in the morning hours. Try to keep a regular schedule during the day. Limit how often you take naps during the day to 15 to 30 minutes. Will arrange for home sleep study and call with results

## 2021-02-22 DIAGNOSIS — E1159 Type 2 diabetes mellitus with other circulatory complications: Secondary | ICD-10-CM | POA: Diagnosis not present

## 2021-02-22 DIAGNOSIS — E7800 Pure hypercholesterolemia, unspecified: Secondary | ICD-10-CM | POA: Diagnosis not present

## 2021-02-23 DIAGNOSIS — I2510 Atherosclerotic heart disease of native coronary artery without angina pectoris: Secondary | ICD-10-CM | POA: Diagnosis not present

## 2021-02-23 DIAGNOSIS — E1159 Type 2 diabetes mellitus with other circulatory complications: Secondary | ICD-10-CM | POA: Diagnosis not present

## 2021-02-23 DIAGNOSIS — E7800 Pure hypercholesterolemia, unspecified: Secondary | ICD-10-CM | POA: Diagnosis not present

## 2021-02-23 DIAGNOSIS — E1122 Type 2 diabetes mellitus with diabetic chronic kidney disease: Secondary | ICD-10-CM | POA: Diagnosis not present

## 2021-02-23 DIAGNOSIS — Z7984 Long term (current) use of oral hypoglycemic drugs: Secondary | ICD-10-CM | POA: Diagnosis not present

## 2021-02-23 DIAGNOSIS — E1151 Type 2 diabetes mellitus with diabetic peripheral angiopathy without gangrene: Secondary | ICD-10-CM | POA: Diagnosis not present

## 2021-02-23 DIAGNOSIS — E1142 Type 2 diabetes mellitus with diabetic polyneuropathy: Secondary | ICD-10-CM | POA: Diagnosis not present

## 2021-02-23 DIAGNOSIS — E042 Nontoxic multinodular goiter: Secondary | ICD-10-CM | POA: Diagnosis not present

## 2021-02-23 DIAGNOSIS — N182 Chronic kidney disease, stage 2 (mild): Secondary | ICD-10-CM | POA: Diagnosis not present

## 2021-02-26 ENCOUNTER — Emergency Department (HOSPITAL_COMMUNITY)
Admission: EM | Admit: 2021-02-26 | Discharge: 2021-02-27 | Disposition: A | Discharge status: Home / Self Care | Payer: Medicare HMO | Attending: Emergency Medicine | Admitting: Emergency Medicine

## 2021-02-26 ENCOUNTER — Ambulatory Visit (HOSPITAL_COMMUNITY)
Admission: EM | Admit: 2021-02-26 | Discharge: 2021-02-26 | Disposition: A | Discharge status: Home / Self Care | Payer: Medicare HMO

## 2021-02-26 ENCOUNTER — Encounter (HOSPITAL_COMMUNITY): Payer: Self-pay

## 2021-02-26 ENCOUNTER — Emergency Department (HOSPITAL_COMMUNITY): Payer: Medicare HMO

## 2021-02-26 ENCOUNTER — Other Ambulatory Visit: Payer: Self-pay

## 2021-02-26 DIAGNOSIS — Z7984 Long term (current) use of oral hypoglycemic drugs: Secondary | ICD-10-CM | POA: Diagnosis not present

## 2021-02-26 DIAGNOSIS — F0390 Unspecified dementia without behavioral disturbance: Secondary | ICD-10-CM | POA: Insufficient documentation

## 2021-02-26 DIAGNOSIS — S0083XA Contusion of other part of head, initial encounter: Secondary | ICD-10-CM | POA: Diagnosis not present

## 2021-02-26 DIAGNOSIS — S01111A Laceration without foreign body of right eyelid and periocular area, initial encounter: Secondary | ICD-10-CM | POA: Diagnosis not present

## 2021-02-26 DIAGNOSIS — Z79899 Other long term (current) drug therapy: Secondary | ICD-10-CM | POA: Insufficient documentation

## 2021-02-26 DIAGNOSIS — S0081XA Abrasion of other part of head, initial encounter: Secondary | ICD-10-CM

## 2021-02-26 DIAGNOSIS — Z7982 Long term (current) use of aspirin: Secondary | ICD-10-CM | POA: Diagnosis not present

## 2021-02-26 DIAGNOSIS — I10 Essential (primary) hypertension: Secondary | ICD-10-CM | POA: Insufficient documentation

## 2021-02-26 DIAGNOSIS — S0181XA Laceration without foreign body of other part of head, initial encounter: Secondary | ICD-10-CM | POA: Diagnosis not present

## 2021-02-26 DIAGNOSIS — I2510 Atherosclerotic heart disease of native coronary artery without angina pectoris: Secondary | ICD-10-CM | POA: Diagnosis not present

## 2021-02-26 DIAGNOSIS — S0990XA Unspecified injury of head, initial encounter: Secondary | ICD-10-CM

## 2021-02-26 DIAGNOSIS — E1169 Type 2 diabetes mellitus with other specified complication: Secondary | ICD-10-CM | POA: Diagnosis not present

## 2021-02-26 DIAGNOSIS — W19XXXA Unspecified fall, initial encounter: Secondary | ICD-10-CM

## 2021-02-26 DIAGNOSIS — W010XXA Fall on same level from slipping, tripping and stumbling without subsequent striking against object, initial encounter: Secondary | ICD-10-CM | POA: Diagnosis not present

## 2021-02-26 DIAGNOSIS — E785 Hyperlipidemia, unspecified: Secondary | ICD-10-CM | POA: Insufficient documentation

## 2021-02-26 DIAGNOSIS — M47812 Spondylosis without myelopathy or radiculopathy, cervical region: Secondary | ICD-10-CM | POA: Diagnosis not present

## 2021-02-26 DIAGNOSIS — Z043 Encounter for examination and observation following other accident: Secondary | ICD-10-CM | POA: Diagnosis not present

## 2021-02-26 MED ORDER — ACETAMINOPHEN 325 MG PO TABS
650.0000 mg | ORAL_TABLET | Freq: Once | ORAL | Status: AC
  Administered 2021-02-27: 650 mg via ORAL
  Filled 2021-02-26: qty 2

## 2021-02-26 NOTE — Discharge Instructions (Addendum)
Go to ER

## 2021-02-26 NOTE — ED Triage Notes (Signed)
Pt presents with right eye laceration and skin tear on right arm after fall. Pt states tripped in fell in bedroom and head bed frame. Pt is on aspirin daily. Denies any headache blurred vision or dizziness. Pt denies any LOC.   Pt's daughter states this is not the same story the pt told her approx one hour ago after the fall.   Patient is being discharged from the Urgent Care and sent to the Emergency Department via POV . Per E Raspit, PA, patient is in need of higher level of care due to facial laceration. Patient is aware and verbalizes understanding of plan of care.  Vitals:   02/26/21 1825  BP: 118/70  Pulse: 64  Resp: 17  SpO2: 100%

## 2021-02-26 NOTE — ED Triage Notes (Signed)
Mechanical fall and has laceration to right eye lid and skin tear on right arm. Denies any LOC. Uses plavix but last use was about a month.  Denies neck pain and dizziness. [id number]

## 2021-02-26 NOTE — ED Provider Notes (Signed)
[facility]    CSN: [id number] Arrival date & time: 02/26/21  1825      History   Chief Complaint No chief complaint on file.   HPI Alex Meyers is a 80 y.o. male.   Patient present today accompanied by his daughter who help provide the majority of history.  I was called to triage by nursing staff given patient's clinical presentation for evaluation.  Daughter went to run an errand and he, and the time she was gone, managed to fall and cut his face.  It is unclear exactly what happened but he notes that he was carrying some documents when he lost his footing and fell bumping the side of his right eye on a piece of furniture.  Denies any loss of consciousness or ongoing symptoms including headache, visual disturbance, weakness, nausea, vomiting.  He denies any additional injury or pain.  He did sustain a laceration in this area which has not yet been cleaned or dressed.   Past Medical History:  Diagnosis Date   Coronary artery disease    s//p DES LAD and Dx1 01/2009   Diabetes mellitus    Diverticulosis    Hx of colonic polyps 08/2010   Dr. Hilarie Fredrickson   Hyperlipidemia    pt denies   Hypertension    pt. denies   Internal hemorrhoids with Grade 2-3 prolapse and bleeding 09/25/2012   Diagnosed by Dr. Linda Hedges via anoscopy    Multiple thyroid nodules    Personal history of colonic polyps 08/09/2002   ADENOMATOUS POLYP   Tubular adenoma of colon     Patient Active Problem List   Diagnosis Date Noted   Dementia ([place]) 12/07/2020   Tear of right supraspinatus tendon 10/14/2019   Sore nose 10/12/2017   B12 deficiency 05/11/2017   Type 2 diabetes mellitus with pressure callus ([place]) 07/07/2015   Tinnitus aurium 03/05/2015   Internal hemorrhoids with Grade 2-3 prolapse and bleeding 09/25/2012   Benign localized hyperplasia of prostate without urinary obstruction and other lower urinary tract symptoms (LUTS) 06/01/2011   History of adenomatous polyp of colon 03/25/2011    Routine general medical examination at a health care facility 12/28/2010   ERECTILE DYSFUNCTION, ORGANIC 11/30/2009   OSTEOARTHRITIS, GENERALIZED 11/27/2009   CAD (coronary artery disease) 02/01/2009   Diabetes mellitus with complication ([place]) 99991111   Hyperlipidemia with target LDL less than 70 01/31/2009   THYROID NODULE 11/13/2006   Essential hypertension 11/13/2006   IBS 11/13/2006    Past Surgical History:  Procedure Laterality Date   COLONOSCOPY  multiple   CORONARY STENT PLACEMENT  01/22/2009   right side   WRIST SURGERY     right side       Home Medications    Prior to Admission medications   Medication Sig Start Date End Date Taking? Authorizing Provider  aspirin EC 81 MG tablet Take 1 tablet (81 mg total) by mouth daily. Swallow whole. 01/08/21   Sherren Mocha, MD  cyanocobalamin 1000 MCG tablet Take 2 tablets once a day    [provider]  Dapsone 5 % topical gel  04/07/19   [provider]  diclofenac Sodium (VOLTAREN) 1 % GEL Apply 4 g topically 4 (four) times daily. 12/05/20   Deno Etienne, DO  donepezil (ARICEPT) 10 MG tablet Take 1 tablet (10 mg total) by mouth at bedtime. 12/15/20   Burchette, Alinda Sierras, MD  empagliflozin (JARDIANCE) 10 MG TABS tablet Take by mouth. 09/29/17   [provider]  Glucosamine HCl 1000 MG TABS Take 1,000 mg by mouth daily.    [provider]  metFORMIN (GLUCOPHAGE-XR) 500 MG 24 hr tablet Take 2 tablets by mouth 2 (two) times daily. Take two tablets by mouth twice daily 06/06/14   [provider]  pioglitazone (ACTOS) 45 MG tablet Take 45 mg by mouth daily. 06/06/14   [provider]  rosuvastatin (CRESTOR) 20 MG tablet Take 1 tablet (20 mg total) by mouth daily. 04/22/11   Sherren Mocha, MD  sitaGLIPtin (JANUVIA) 100 MG tablet Take 1 tablet by mouth once a day for diabetes 09/13/17   [provider]    Family History Family History  Problem Relation Age of Onset   Heart  failure Mother    Coronary artery disease Mother    Bladder Cancer Father 56   Breast cancer Daughter    Colon cancer Neg Hx     Social History Social History   Tobacco Use   Smoking status: Never   Smokeless tobacco: Never  Vaping Use   Vaping Use: Never used  Substance Use Topics   Alcohol use: Yes    Alcohol/week: 4.0 standard drinks    Types: 4 Cans of beer per week    Comment: weekly   Drug use: No     Allergies   Patient has no known allergies.   Review of Systems Review of Systems  Constitutional:  Negative for activity change, appetite change, fatigue and fever.  Eyes:  Negative for visual disturbance.  Respiratory:  Negative for cough and shortness of breath.   Cardiovascular:  Negative for chest pain.  Gastrointestinal:  Negative for abdominal pain, diarrhea, nausea and vomiting.  Skin:  Positive for wound.  Neurological:  Negative for dizziness, syncope, weakness, light-headedness, numbness and headaches.    Physical Exam Triage Vital Signs ED Triage Vitals [02/26/21 1825]  Enc Vitals Group     BP 118/70     Pulse Rate 64     Resp 17     Temp      Temp src      SpO2 100 %     Weight      Height      Head Circumference      Peak Flow      Pain Score      Pain Loc      Pain Edu?      Excl. in [place]?    No data found.  Updated Vital Signs BP 118/70 (BP Location: Left Arm)   Pulse 64   Resp 17   SpO2 100%   Visual Acuity Right Eye Distance:   Left Eye Distance:   Bilateral Distance:    Right Eye Near:   Left Eye Near:    Bilateral Near:     Physical Exam Vitals reviewed.  Constitutional:      General: He is awake.     Appearance: Normal appearance. He is normal weight. He is not ill-appearing.     Comments: Very pleasant male appears stated age no acute distress sitting comfortably in triage  HENT:     Head: Normocephalic and atraumatic.  Eyes:     Comments: Laceration with sanguinous drainage noted lateral right orbit.  No  significant tenderness palpation around orbit.  Normal extraocular movements.  Cardiovascular:     Rate and Rhythm: Normal rate and regular rhythm.     Heart sounds: Normal heart sounds, S1 normal and S2 normal. No murmur heard. Pulmonary:  Effort: Pulmonary effort is normal.     Breath sounds: Normal breath sounds. No stridor. No wheezing, rhonchi or rales.     Comments: Clear to auscultation bilaterally Abdominal:     General: Bowel sounds are normal.     Palpations: Abdomen is soft.     Tenderness: There is no abdominal tenderness.  Neurological:     Mental Status: He is alert.  Psychiatric:        Behavior: Behavior is cooperative.       UC Treatments / Results  Labs (all labs ordered are listed, but only abnormal results are displayed) Labs Reviewed - No data to display  EKG   Radiology No results found.  Procedures Procedures (including critical care time)  Medications Ordered in UC Medications - No data to display  Initial Impression / Assessment and Plan / UC Course  I have reviewed the triage vital signs and the nursing notes.  Pertinent labs & imaging results that were available during my care of the patient were reviewed by me and considered in my medical decision making (see chart for details).      Based on [place] head CT rules patient requires head CT given known head impact and he is over the age of 76 and on blood thinning medication (aspirin).  Patient was hesitant but agreeable to going to the emergency room for further evaluation as we do not have head CT capabilities.  His vital signs and physical exam are stable and he was safe for private transport.  He will have his daughter take him directly to [facility] emergency room following visit for further evaluation and management.  Final Clinical Impressions(s) / UC Diagnoses   Final diagnoses:  Fall, initial encounter  Injury of head, initial encounter  Facial laceration, initial encounter      Discharge Instructions      Go to ER     ED Prescriptions   None    PDMP not reviewed this encounter.   Terrilee Croak, PA-C 02/26/21 1841

## 2021-02-26 NOTE — ED Provider Notes (Signed)
[place] [facility] EMERGENCY DEPARTMENT Provider Note   CSN: [id number] Arrival date & time: 02/26/21  1853  LEVEL 5 CAVEAT - DEMENTIA   History No chief complaint on file.   Alex Meyers is a 80 y.o. male.  HPI 80 year old male presents after a fall and head injury.  Patient reportedly tripped and fell.  He denies any dizziness or lightheadedness though he does have some dementia.  He struck his right side of his head.  He is having a localized headache in that area.  He suffered a laceration/abrasion.  He used to be on Plavix but was recently taken off.  He also scraped his right forearm.  He rates his pain as moderate.  No vision changes or eye pain.  Patient and daughter report that the patient's last tetanus immunization was about 3 years ago.  Past Medical History:  Diagnosis Date   Coronary artery disease    s//p DES LAD and Dx1 01/2009   Diabetes mellitus    Diverticulosis    Hx of colonic polyps 08/2010   Dr. Hilarie Fredrickson   Hyperlipidemia    pt denies   Hypertension    pt. denies   Internal hemorrhoids with Grade 2-3 prolapse and bleeding 09/25/2012   Diagnosed by Dr. Linda Hedges via anoscopy    Multiple thyroid nodules    Personal history of colonic polyps 08/09/2002   ADENOMATOUS POLYP   Tubular adenoma of colon     Patient Active Problem List   Diagnosis Date Noted   Dementia ([name]) 12/07/2020   Tear of right supraspinatus tendon 10/14/2019   Sore nose 10/12/2017   B12 deficiency 05/11/2017   Type 2 diabetes mellitus with pressure callus ([place]) 07/07/2015   Tinnitus aurium 03/05/2015   Internal hemorrhoids with Grade 2-3 prolapse and bleeding 09/25/2012   Benign localized hyperplasia of prostate without urinary obstruction and other lower urinary tract symptoms (LUTS) 06/01/2011   History of adenomatous polyp of colon 03/25/2011   Routine general medical examination at a health care facility 12/28/2010   ERECTILE DYSFUNCTION, ORGANIC 11/30/2009    OSTEOARTHRITIS, GENERALIZED 11/27/2009   CAD (coronary artery disease) 02/01/2009   Diabetes mellitus with complication ([place]) 99991111   Hyperlipidemia with target LDL less than 70 01/31/2009   THYROID NODULE 11/13/2006   Essential hypertension 11/13/2006   IBS 11/13/2006    Past Surgical History:  Procedure Laterality Date   COLONOSCOPY  multiple   CORONARY STENT PLACEMENT  01/22/2009   right side   WRIST SURGERY     right side       Family History  Problem Relation Age of Onset   Heart failure Mother    Coronary artery disease Mother    Bladder Cancer Father 97   Breast cancer Daughter    Colon cancer Neg Hx     Social History   Tobacco Use   Smoking status: Never   Smokeless tobacco: Never  Vaping Use   Vaping Use: Never used  Substance Use Topics   Alcohol use: Yes    Alcohol/week: 4.0 standard drinks    Types: 4 Cans of beer per week    Comment: weekly   Drug use: No    Home Medications Prior to Admission medications   Medication Sig Start Date End Date Taking? Authorizing Provider  aspirin EC 81 MG tablet Take 1 tablet (81 mg total) by mouth daily. Swallow whole. 01/08/21   Sherren Mocha, MD  cyanocobalamin 1000 MCG tablet Take 2 tablets once  a day    [provider]  Dapsone 5 % topical gel  04/07/19   [provider]  diclofenac Sodium (VOLTAREN) 1 % GEL Apply 4 g topically 4 (four) times daily. 12/05/20   Deno Etienne, DO  donepezil (ARICEPT) 10 MG tablet Take 1 tablet (10 mg total) by mouth at bedtime. 12/15/20   Burchette, Alinda Sierras, MD  empagliflozin (JARDIANCE) 10 MG TABS tablet Take by mouth. 09/29/17   [provider]  Glucosamine HCl 1000 MG TABS Take 1,000 mg by mouth daily.    [provider]  metFORMIN (GLUCOPHAGE-XR) 500 MG 24 hr tablet Take 2 tablets by mouth 2 (two) times daily. Take two tablets by mouth twice daily 06/06/14   [provider]  pioglitazone (ACTOS) 45 MG tablet Take 45 mg by mouth daily.  06/06/14   [provider]  rosuvastatin (CRESTOR) 20 MG tablet Take 1 tablet (20 mg total) by mouth daily. 04/22/11   Sherren Mocha, MD  sitaGLIPtin (JANUVIA) 100 MG tablet Take 1 tablet by mouth once a day for diabetes 09/13/17   [provider]    Allergies    Patient has no known allergies.  Review of Systems   Review of Systems  Unable to perform ROS: Dementia   Physical Exam Updated Vital Signs BP (!) 139/91 (BP Location: Left Arm)   Pulse 60   Temp 98.3 F (36.8 C)   Resp 18   Ht '5\' 11"'$  (1.803 m)   Wt 68.2 kg   SpO2 99%   BMI 20.97 kg/m   Physical Exam Vitals and nursing note reviewed.  Constitutional:      General: He is not in acute distress.    Appearance: He is well-developed.  HENT:     Head: Normocephalic.      Comments: Around 1 cm irregularly shaped abrasion.  Mild skin loss. Right maxilla is tender with swelling and ecchymosis.    Right Ear: External ear normal.     Left Ear: External ear normal.     Nose: Nose normal.  Eyes:     General:        Right eye: No discharge.        Left eye: No discharge.     Extraocular Movements: Extraocular movements intact.     Pupils: Pupils are equal, round, and reactive to light.  Cardiovascular:     Rate and Rhythm: Normal rate and regular rhythm.     Heart sounds: Normal heart sounds.  Pulmonary:     Effort: Pulmonary effort is normal.  Abdominal:     General: There is no distension.  Musculoskeletal:     Cervical back: Neck supple.     Comments: Small abrasion to dorsal forearm. No tenderness, swelling. Normal ROM of wrist. 2+ radial pulse  Skin:    General: Skin is warm and dry.  Neurological:     Mental Status: He is alert.     Comments: CN 3-12 grossly intact. 5/5 strength in all 4 extremities. Grossly normal sensation. Normal finger to nose.   Psychiatric:        Mood and Affect: Mood is not anxious.    ED Results / Procedures / Treatments   Labs (all labs ordered are listed,  but only abnormal results are displayed) Labs Reviewed - No data to display  EKG None  Radiology CT Head Wo Contrast  Result Date: 02/26/2021 CLINICAL DATA:  Fall.  Right eye laceration. EXAM: CT HEAD WITHOUT CONTRAST CT MAXILLOFACIAL  WITHOUT CONTRAST CT CERVICAL SPINE WITHOUT CONTRAST TECHNIQUE: Multidetector CT imaging of the head, cervical spine, and maxillofacial structures were performed using the standard protocol without intravenous contrast. Multiplanar CT image reconstructions of the cervical spine and maxillofacial structures were also generated. COMPARISON:  MRI brain dated October 23, 2020. FINDINGS: CT HEAD FINDINGS Brain: No evidence of acute infarction, hemorrhage, hydrocephalus, extra-axial collection or mass lesion/mass effect. Stable atrophy and chronic microvascular ischemic changes. Vascular: Atherosclerotic vascular calcification of the carotid siphons. No hyperdense vessel. Skull: Normal. Negative for fracture or focal lesion. Other: None. CT MAXILLOFACIAL FINDINGS Limited by motion artifact. Osseous: No fracture or mandibular dislocation. No destructive process. Orbits: Negative. No traumatic or inflammatory finding. Sinuses: Clear. Soft tissues: Small right periorbital hematoma. Mild right facial soft tissue swelling. CT CERVICAL SPINE FINDINGS Alignment: No traumatic malalignment. Trace retrolisthesis at C2-C3, C4-C5, and C5-C6. Trace anterolisthesis at C3-C4. Skull base and vertebrae: No acute fracture. No primary bone lesion or focal pathologic process. Soft tissues and spinal canal: No prevertebral fluid or swelling. No visible canal hematoma. Disc levels: Moderate disc height loss and right uncovertebral hypertrophy at C2-C3, C4-C5, and C5-C6. Upper chest: Negative. Other: None. IMPRESSION: 1. No acute intracranial abnormality. Stable atrophy and chronic microvascular ischemic changes. 2. No acute maxillofacial fracture. Small right periorbital and facial hematoma. 3. No acute  cervical spine fracture or traumatic listhesis. Electronically Signed   By: Titus Dubin M.D.   On: 02/26/2021 20:31   CT Cervical Spine Wo Contrast  Result Date: 02/26/2021 CLINICAL DATA:  Fall.  Right eye laceration. EXAM: CT HEAD WITHOUT CONTRAST CT MAXILLOFACIAL WITHOUT CONTRAST CT CERVICAL SPINE WITHOUT CONTRAST TECHNIQUE: Multidetector CT imaging of the head, cervical spine, and maxillofacial structures were performed using the standard protocol without intravenous contrast. Multiplanar CT image reconstructions of the cervical spine and maxillofacial structures were also generated. COMPARISON:  MRI brain dated October 23, 2020. FINDINGS: CT HEAD FINDINGS Brain: No evidence of acute infarction, hemorrhage, hydrocephalus, extra-axial collection or mass lesion/mass effect. Stable atrophy and chronic microvascular ischemic changes. Vascular: Atherosclerotic vascular calcification of the carotid siphons. No hyperdense vessel. Skull: Normal. Negative for fracture or focal lesion. Other: None. CT MAXILLOFACIAL FINDINGS Limited by motion artifact. Osseous: No fracture or mandibular dislocation. No destructive process. Orbits: Negative. No traumatic or inflammatory finding. Sinuses: Clear. Soft tissues: Small right periorbital hematoma. Mild right facial soft tissue swelling. CT CERVICAL SPINE FINDINGS Alignment: No traumatic malalignment. Trace retrolisthesis at C2-C3, C4-C5, and C5-C6. Trace anterolisthesis at C3-C4. Skull base and vertebrae: No acute fracture. No primary bone lesion or focal pathologic process. Soft tissues and spinal canal: No prevertebral fluid or swelling. No visible canal hematoma. Disc levels: Moderate disc height loss and right uncovertebral hypertrophy at C2-C3, C4-C5, and C5-C6. Upper chest: Negative. Other: None. IMPRESSION: 1. No acute intracranial abnormality. Stable atrophy and chronic microvascular ischemic changes. 2. No acute maxillofacial fracture. Small right periorbital and  facial hematoma. 3. No acute cervical spine fracture or traumatic listhesis. Electronically Signed   By: Titus Dubin M.D.   On: 02/26/2021 20:31   CT Maxillofacial WO CM  Result Date: 02/26/2021 CLINICAL DATA:  Fall.  Right eye laceration. EXAM: CT HEAD WITHOUT CONTRAST CT MAXILLOFACIAL WITHOUT CONTRAST CT CERVICAL SPINE WITHOUT CONTRAST TECHNIQUE: Multidetector CT imaging of the head, cervical spine, and maxillofacial structures were performed using the standard protocol without intravenous contrast. Multiplanar CT image reconstructions of the cervical spine and maxillofacial structures were also generated. COMPARISON:  MRI brain dated October 23, 2020. FINDINGS:  CT HEAD FINDINGS Brain: No evidence of acute infarction, hemorrhage, hydrocephalus, extra-axial collection or mass lesion/mass effect. Stable atrophy and chronic microvascular ischemic changes. Vascular: Atherosclerotic vascular calcification of the carotid siphons. No hyperdense vessel. Skull: Normal. Negative for fracture or focal lesion. Other: None. CT MAXILLOFACIAL FINDINGS Limited by motion artifact. Osseous: No fracture or mandibular dislocation. No destructive process. Orbits: Negative. No traumatic or inflammatory finding. Sinuses: Clear. Soft tissues: Small right periorbital hematoma. Mild right facial soft tissue swelling. CT CERVICAL SPINE FINDINGS Alignment: No traumatic malalignment. Trace retrolisthesis at C2-C3, C4-C5, and C5-C6. Trace anterolisthesis at C3-C4. Skull base and vertebrae: No acute fracture. No primary bone lesion or focal pathologic process. Soft tissues and spinal canal: No prevertebral fluid or swelling. No visible canal hematoma. Disc levels: Moderate disc height loss and right uncovertebral hypertrophy at C2-C3, C4-C5, and C5-C6. Upper chest: Negative. Other: None. IMPRESSION: 1. No acute intracranial abnormality. Stable atrophy and chronic microvascular ischemic changes. 2. No acute maxillofacial fracture. Small  right periorbital and facial hematoma. 3. No acute cervical spine fracture or traumatic listhesis. Electronically Signed   By: Titus Dubin M.D.   On: 02/26/2021 20:31    Procedures Procedures   Medications Ordered in ED Medications  acetaminophen (TYLENOL) tablet 650 mg (has no administration in time range)    ED Course  I have reviewed the triage vital signs and the nursing notes.  Pertinent labs & imaging results that were available during my care of the patient were reviewed by me and considered in my medical decision making (see chart for details).    MDM Rules/Calculators/A&P                           Looking at the wound just lateral to his right orbit, it seems to be more of an abrasion with a small amount of skin loss rather than a laceration that can be fixed.  We will clean the wound here and advise daily cleaning at home and dressing the wound and follow-up with PCP.  CT images reviewed and there is no obvious fracture or bleeding.  Will give Tylenol and ice. Final Clinical Impression(s) / ED Diagnoses Final diagnoses:  Facial abrasion, initial encounter  Traumatic ecchymosis of face, initial encounter  Fall, initial encounter    Rx / DC Orders ED Discharge Orders     None        Sherwood Gambler, MD 02/27/21 [phone]

## 2021-02-26 NOTE — ED Provider Notes (Signed)
Emergency Medicine Provider Triage Evaluation Note  Alex Meyers , a 80 y.o. male  was evaluated in triage.  Pt complains of fall.  Fall was mechanical nonsyncopal after he tripped while holding a box.  He struck the right side of his head.  He does not take any blood thinning medications, denies any prodromal symptoms.  Unknown last tetanus, denies any vision changes.  Review of Systems  Positive: Fall, head injury, facial pain Negative: Syncope, blood thinner use  Physical Exam  BP (!) 143/71 (BP Location: Left Arm)   Pulse (!) 58   Temp 98 F (36.7 C) (Oral)   Resp 16   Ht '5\' 11"'$  (1.803 m)   Wt 68.2 kg   SpO2 100%   BMI 20.97 kg/m  Gen:   Awake, no distress   Resp:  Normal effort  MSK:   Moves extremities without difficulty  Other:  Skin tear/laceration on the right upper lateral lid.  Medical Decision Making  Medically screening exam initiated at 7:29 PM.  Appropriate orders placed.  Alex Meyers was informed that the remainder of the evaluation will be completed by another provider, this initial triage assessment does not replace that evaluation, and the importance of remaining in the ED until their evaluation is complete.  Patient is a 80 year old gentleman who presents today for evaluation after mechanical nonsyncopal fall.  He is not anticoagulated. Given his age will obtain CT head, max face, and neck.    Note: Portions of this report may have been transcribed using voice recognition software. Every effort was made to ensure accuracy; however, inadvertent computerized transcription errors may be present    Alex Meyers 02/26/21 1932    Luna Fuse, MD 03/01/21 [phone]

## 2021-02-27 NOTE — Discharge Instructions (Addendum)
You may take Tylenol for pain and apply ice to the sore area.  Clean the skin daily with soap and water.  If you develop new or worsening headache, vomiting, vision changes, signs of infection, or any other new/concerning symptoms then return to the ER for evaluation.

## 2021-02-28 ENCOUNTER — Encounter (HOSPITAL_COMMUNITY): Payer: Self-pay | Admitting: Emergency Medicine

## 2021-02-28 ENCOUNTER — Other Ambulatory Visit: Payer: Self-pay

## 2021-02-28 ENCOUNTER — Emergency Department (HOSPITAL_COMMUNITY)
Admission: EM | Admit: 2021-02-28 | Discharge: 2021-03-01 | Disposition: A | Discharge status: Home / Self Care | Payer: Medicare HMO | Attending: Emergency Medicine | Admitting: Emergency Medicine

## 2021-02-28 DIAGNOSIS — Z20822 Contact with and (suspected) exposure to covid-19: Secondary | ICD-10-CM | POA: Insufficient documentation

## 2021-02-28 DIAGNOSIS — Z5321 Procedure and treatment not carried out due to patient leaving prior to being seen by health care provider: Secondary | ICD-10-CM | POA: Diagnosis not present

## 2021-02-28 LAB — RESP PANEL BY RT-PCR (FLU A&B, COVID) ARPGX2
Influenza A by PCR: NEGATIVE
Influenza B by PCR: NEGATIVE
SARS Coronavirus 2 by RT PCR: NEGATIVE

## 2021-02-28 NOTE — ED Notes (Signed)
Pt left with family

## 2021-02-28 NOTE — ED Triage Notes (Signed)
Pt reports he is moving to a facility tomorrow and they called stating he needs a test.  No symptoms reported or noted.

## 2021-02-28 NOTE — ED Provider Notes (Signed)
Emergency Medicine Provider Triage Evaluation Note  CHADEN BATTIS , a 80 y.o. male  was evaluated in triage.  Pt complains of needing PCR covid test.  States he is moving to assisted living facility tomorrow and they called today to inform him he needed a negative PCR test in order to move in.  Denies covid symptoms..  Review of Systems  Positive: Needs covid test Negative: Fever, cough  Physical Exam  BP 126/74 (BP Location: Right Arm)   Pulse 70   Temp 98.2 F (36.8 C) (Oral)   Resp 16   SpO2 96%   Gen:   Awake, no distress   Resp:  Normal effort  MSK:   Moves extremities without difficulty  Other:    Medical Decision Making  Medically screening exam initiated at 10:51 PM.  Appropriate orders placed.  Noble Hardman Vandervoort was informed that the remainder of the evaluation will be completed by another provider, this initial triage assessment does not replace that evaluation, and the importance of remaining in the ED until their evaluation is complete.  Covid PCR sent.   Larene Pickett, PA-C 02/28/21 [phone]    Gareth Morgan, MD 03/01/21 2255

## 2021-03-01 DIAGNOSIS — H2513 Age-related nuclear cataract, bilateral: Secondary | ICD-10-CM | POA: Diagnosis not present

## 2021-03-01 DIAGNOSIS — H16223 Keratoconjunctivitis sicca, not specified as Sjogren's, bilateral: Secondary | ICD-10-CM | POA: Diagnosis not present

## 2021-03-01 DIAGNOSIS — H0102A Squamous blepharitis right eye, upper and lower eyelids: Secondary | ICD-10-CM | POA: Diagnosis not present

## 2021-03-01 DIAGNOSIS — H43811 Vitreous degeneration, right eye: Secondary | ICD-10-CM | POA: Diagnosis not present

## 2021-03-01 DIAGNOSIS — H0102B Squamous blepharitis left eye, upper and lower eyelids: Secondary | ICD-10-CM | POA: Diagnosis not present

## 2021-03-01 DIAGNOSIS — S0011XA Contusion of right eyelid and periocular area, initial encounter: Secondary | ICD-10-CM | POA: Diagnosis not present

## 2021-03-01 DIAGNOSIS — E119 Type 2 diabetes mellitus without complications: Secondary | ICD-10-CM | POA: Diagnosis not present

## 2021-03-01 DIAGNOSIS — H3561 Retinal hemorrhage, right eye: Secondary | ICD-10-CM | POA: Diagnosis not present

## 2021-03-01 DIAGNOSIS — H10413 Chronic giant papillary conjunctivitis, bilateral: Secondary | ICD-10-CM | POA: Diagnosis not present

## 2021-03-01 LAB — HM DIABETES EYE EXAM

## 2021-03-02 ENCOUNTER — Encounter: Payer: Self-pay | Admitting: Family Medicine

## 2021-03-02 DIAGNOSIS — E118 Type 2 diabetes mellitus with unspecified complications: Secondary | ICD-10-CM | POA: Diagnosis not present

## 2021-03-02 DIAGNOSIS — F0280 Dementia in other diseases classified elsewhere without behavioral disturbance: Secondary | ICD-10-CM | POA: Diagnosis not present

## 2021-03-02 DIAGNOSIS — G300 Alzheimer's disease with early onset: Secondary | ICD-10-CM | POA: Diagnosis not present

## 2021-03-04 ENCOUNTER — Other Ambulatory Visit: Payer: Self-pay | Admitting: Sports Medicine

## 2021-05-31 DIAGNOSIS — E119 Type 2 diabetes mellitus without complications: Secondary | ICD-10-CM | POA: Diagnosis not present

## 2021-05-31 DIAGNOSIS — E118 Type 2 diabetes mellitus with unspecified complications: Secondary | ICD-10-CM | POA: Diagnosis not present

## 2021-05-31 DIAGNOSIS — G301 Alzheimer's disease with late onset: Secondary | ICD-10-CM | POA: Diagnosis not present

## 2021-05-31 DIAGNOSIS — F02C0 Dementia in other diseases classified elsewhere, severe, without behavioral disturbance, psychotic disturbance, mood disturbance, and anxiety: Secondary | ICD-10-CM | POA: Diagnosis not present

## 2021-06-08 DIAGNOSIS — R58 Hemorrhage, not elsewhere classified: Secondary | ICD-10-CM | POA: Diagnosis not present

## 2021-06-08 DIAGNOSIS — S81812A Laceration without foreign body, left lower leg, initial encounter: Secondary | ICD-10-CM | POA: Diagnosis not present

## 2021-06-17 ENCOUNTER — Telehealth: Payer: Self-pay | Admitting: Family Medicine

## 2021-06-17 NOTE — Telephone Encounter (Signed)
Alex Meyers doesn't need an AWV--he has moved to [place] and has a new pcp.

## 2021-06-24 DIAGNOSIS — G309 Alzheimer's disease, unspecified: Secondary | ICD-10-CM | POA: Diagnosis not present

## 2021-06-24 DIAGNOSIS — R350 Frequency of micturition: Secondary | ICD-10-CM | POA: Diagnosis not present

## 2021-06-24 DIAGNOSIS — F02A0 Dementia in other diseases classified elsewhere, mild, without behavioral disturbance, psychotic disturbance, mood disturbance, and anxiety: Secondary | ICD-10-CM | POA: Diagnosis not present

## 2021-06-24 DIAGNOSIS — G479 Sleep disorder, unspecified: Secondary | ICD-10-CM | POA: Diagnosis not present

## 2021-06-24 DIAGNOSIS — Z79899 Other long term (current) drug therapy: Secondary | ICD-10-CM | POA: Diagnosis not present

## 2021-06-24 DIAGNOSIS — F0280 Dementia in other diseases classified elsewhere without behavioral disturbance: Secondary | ICD-10-CM | POA: Diagnosis not present

## 2021-06-24 DIAGNOSIS — N3941 Urge incontinence: Secondary | ICD-10-CM | POA: Diagnosis not present

## 2021-08-25 ENCOUNTER — Ambulatory Visit: Payer: Medicare HMO

## 2021-09-16 ENCOUNTER — Encounter: Payer: Self-pay | Admitting: Cardiovascular Disease

## 2021-09-16 NOTE — Progress Notes (Signed)
Called pt's primary number on file to attempt to schedule him for his overdue appt with Dr Burt Knack. Was informed pt has moved to [place], [place] and is following cardiology via [facility]. Will remove recall from list. ?

## 2021-09-20 ENCOUNTER — Encounter: Payer: Self-pay | Admitting: Family Medicine

## 2023-03-22 IMAGING — MR MR HEAD W/O CM
10 series · 48 of 48 positions shown · non-contrast
Comparison: 06/04/2015

CLINICAL DATA: Memory loss.  Recent cognitive changes.

EXAM:
MRI HEAD WITHOUT CONTRAST
TECHNIQUE: Multiplanar, multiecho pulse sequences of the brain and surrounding
structures were obtained without intravenous contrast.

[Series 2: T1 · sagittal · 5.0mm · 0.47mm/px · 2 of 24 slices shown]
[im 1/24]
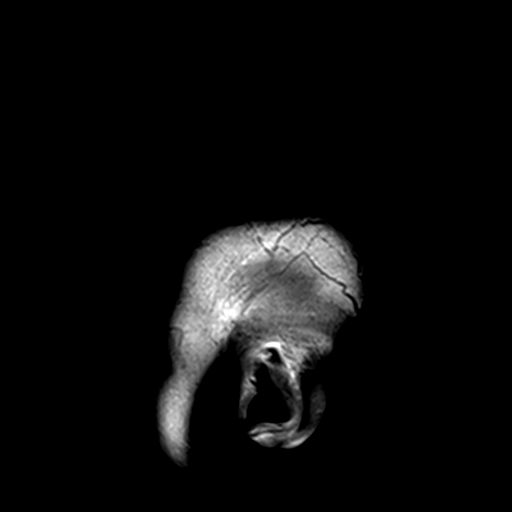
[im 24/24]
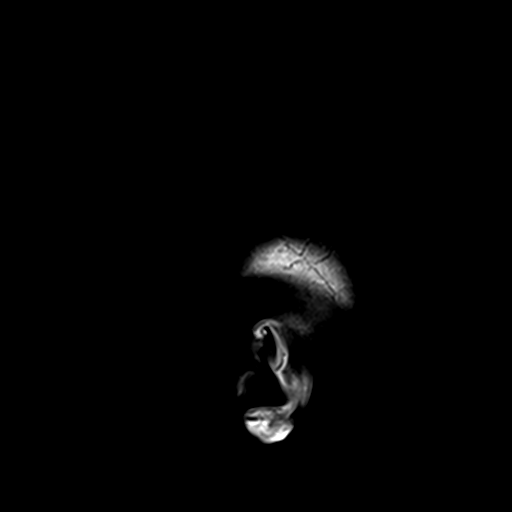

[Series 3: DWI · axial · 3.0mm · 1.80mm/px · z∈[-77,+79]mm · 9 of 108 slices shown (1 of 4)]
[im 1/108]
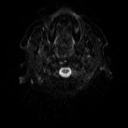
[im 14/108]
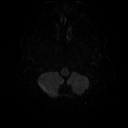
[im 27/108]
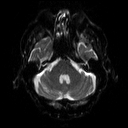
[im 41/108]
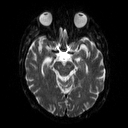
[im 54/108]
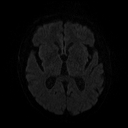
[im 67/108]
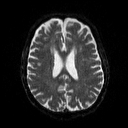
[im 81/108]
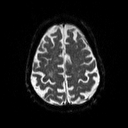
[im 94/108]
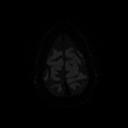
[im 108/108]
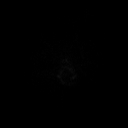

[Series 4: DWI · axial · 3.0mm · 1.80mm/px · z∈[-77,+79]mm · 4 of 50 slices shown (2 of 4)]
[im 1/50]
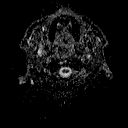
[im 17/50]
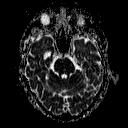
[im 33/50]
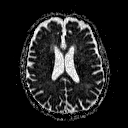
[im 50/50]
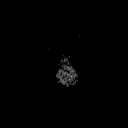

[Series 5: DWI · coronal · 5.0mm · 1.80mm/px · 6 of 80 slices shown (3 of 4)]
[im 1/80]
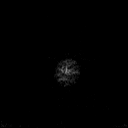
[im 16/80]
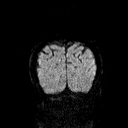
[im 32/80]
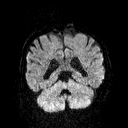
[im 48/80]
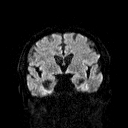
[im 64/80]
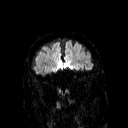
[im 80/80]
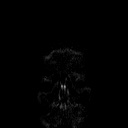

[Series 6: DWI · coronal · 5.0mm · 1.80mm/px · 3 of 39 slices shown (4 of 4)]
[im 1/39]
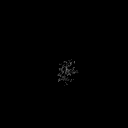
[im 20/39]
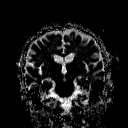
[im 39/39]
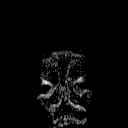

[Series 7: T2 · axial · 5.0mm · 0.51mm/px · z∈[-74,+84]mm · 2 of 24 slices shown (1 of 2)]
[im 1/24]
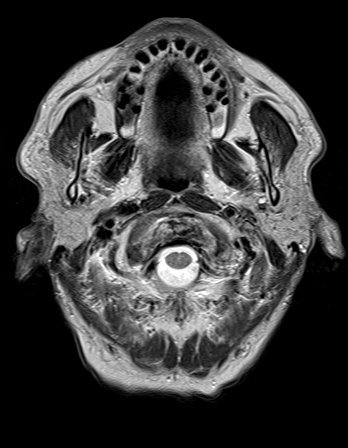
[im 24/24]
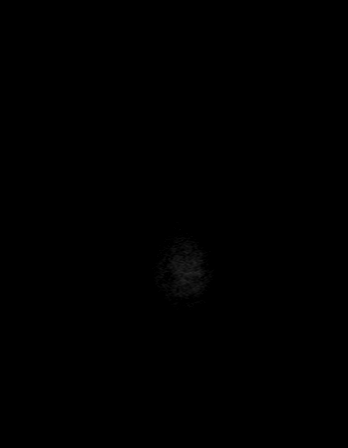

[Series 8: FLAIR · axial · 3.0mm · 0.45mm/px · z∈[-75,+81]mm · 3 of 35 slices shown]
[im 1/35]
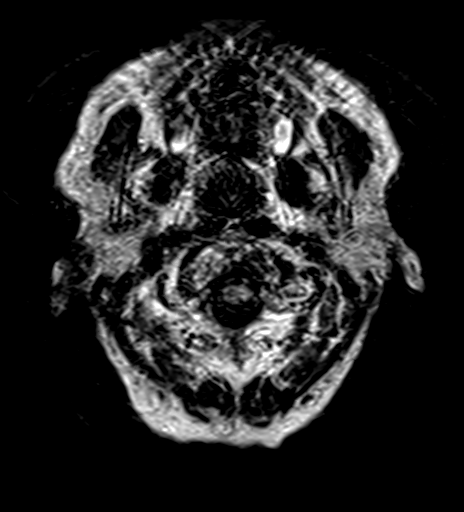
[im 18/35]
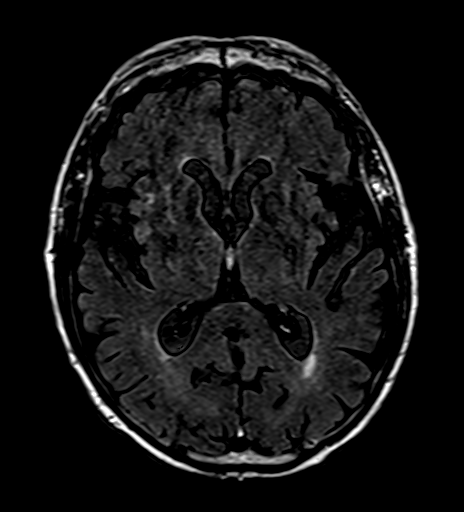
[im 35/35]
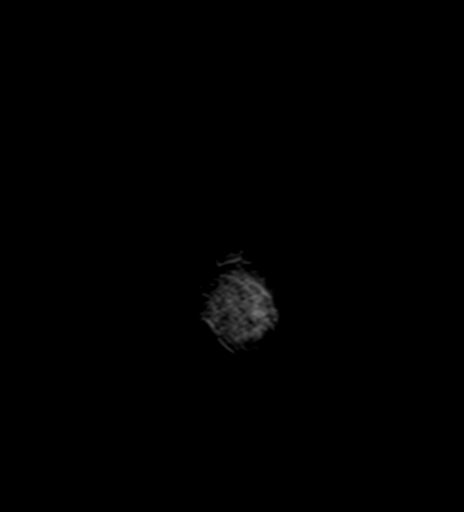

[Series 10: swi_images · axial · 4.0mm · 0.90mm/px · z∈[-73,+80]mm · 3 of 40 slices shown]
[im 1/40]
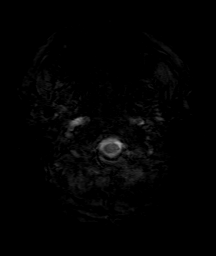
[im 20/40]
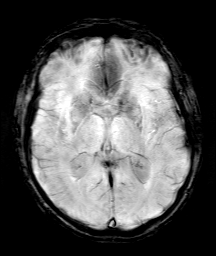
[im 40/40]
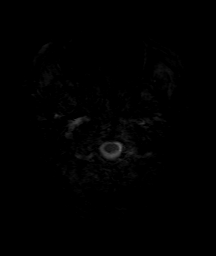

[Series 11: t1_mpr_tra · axial · 1.0mm · 0.71mm/px · z∈[-74,+83]mm · 13 of 160 slices shown]
[im 1/160]
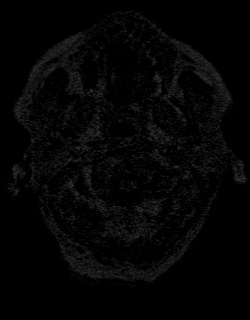
[im 14/160]
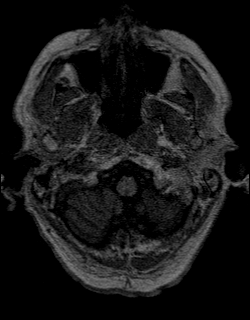
[im 27/160]
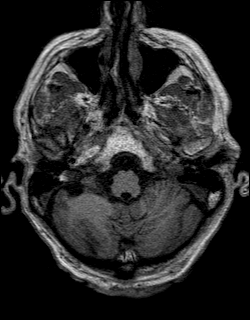
[im 40/160]
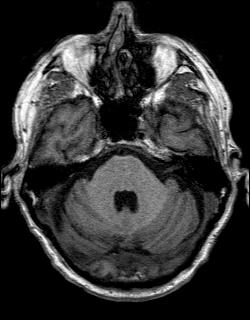
[im 54/160]
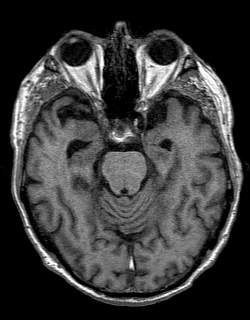
[im 67/160]
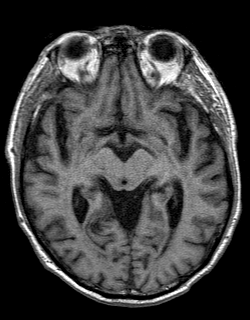
[im 80/160]
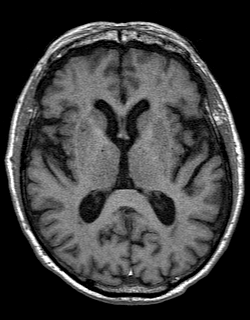
[im 93/160]
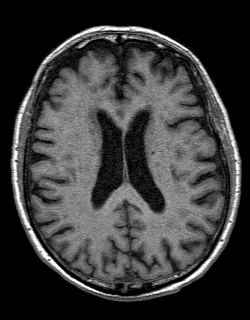
[im 107/160]
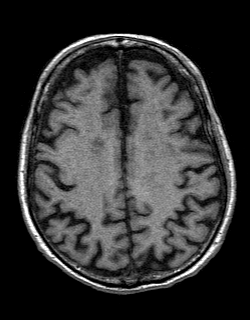
[im 120/160]
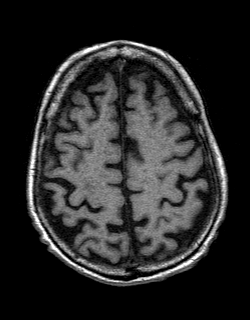
[im 133/160]
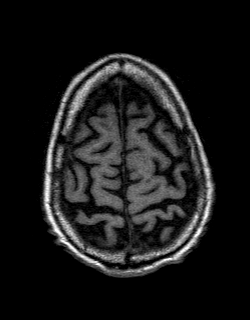
[im 146/160]
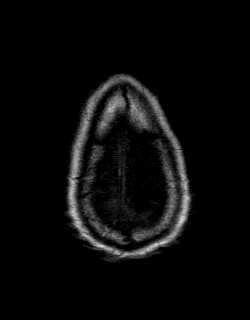
[im 160/160]
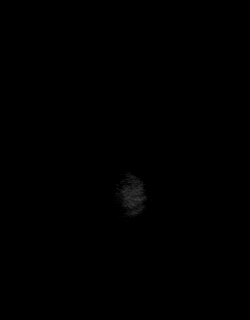

[Series 12: T2 · coronal · 5.0mm · 0.45mm/px · 3 of 31 slices shown (2 of 2)]
[im 1/31]
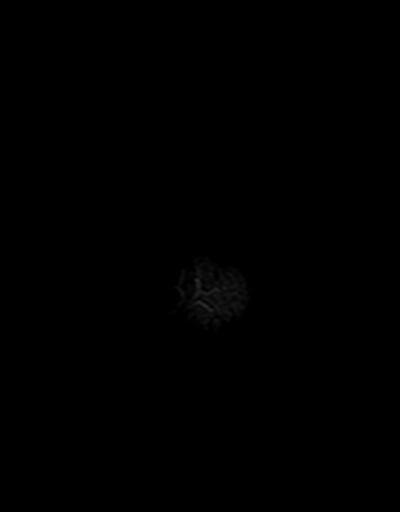
[im 16/31]
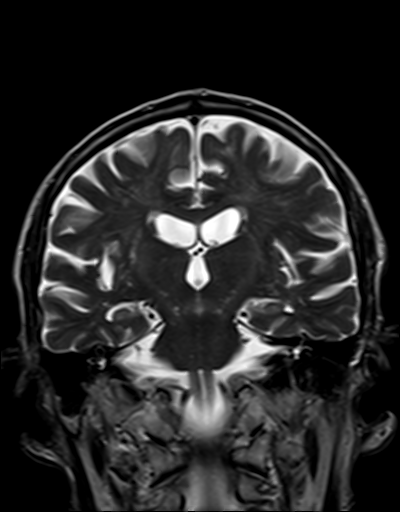
[im 31/31]
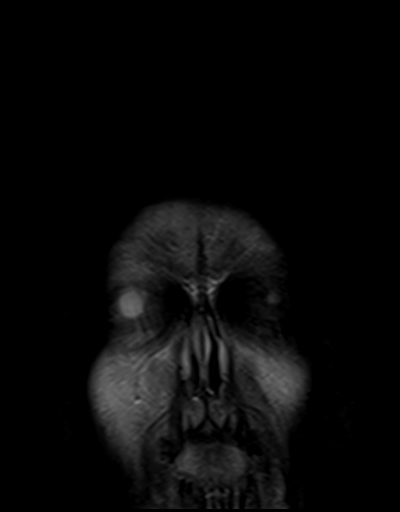

[48 of 48 positions shown; findings below may reference images not displayed]

FINDINGS: Brain: No acute infarction, hemorrhage, hydrocephalus, extra-axial
collection or mass lesion. Cerebral volume loss since 3230, but not
clearly age advanced. Mild chronic white matter disease.

Vascular: Normal flow voids

Skull and upper cervical spine: Normal marrow signal.

Sinuses/Orbits: Negative
IMPRESSION: 1. No reversible finding.
2. Cerebral volume loss since 3230, but not age advanced.
